# Patient Record
Sex: Female | Born: 1990 | Race: White | Hispanic: No | Marital: Single | Smoking: Former smoker
Health system: Southern US, Community
[De-identification: ages and names within clinical notes are randomized; demographics above are authoritative.]

## PROBLEM LIST (undated history)

## (undated) ENCOUNTER — Inpatient Hospital Stay (HOSPITAL_COMMUNITY): Payer: Self-pay

## (undated) DIAGNOSIS — F319 Bipolar disorder, unspecified: Secondary | ICD-10-CM

## (undated) DIAGNOSIS — F32A Depression, unspecified: Secondary | ICD-10-CM

## (undated) DIAGNOSIS — T8859XA Other complications of anesthesia, initial encounter: Secondary | ICD-10-CM

## (undated) DIAGNOSIS — B999 Unspecified infectious disease: Secondary | ICD-10-CM

## (undated) DIAGNOSIS — F329 Major depressive disorder, single episode, unspecified: Secondary | ICD-10-CM

## (undated) DIAGNOSIS — T7422XA Child sexual abuse, confirmed, initial encounter: Secondary | ICD-10-CM

## (undated) DIAGNOSIS — F419 Anxiety disorder, unspecified: Secondary | ICD-10-CM

## (undated) DIAGNOSIS — A599 Trichomoniasis, unspecified: Secondary | ICD-10-CM

## (undated) DIAGNOSIS — R87629 Unspecified abnormal cytological findings in specimens from vagina: Secondary | ICD-10-CM

## (undated) DIAGNOSIS — J45909 Unspecified asthma, uncomplicated: Secondary | ICD-10-CM

## (undated) DIAGNOSIS — T4145XA Adverse effect of unspecified anesthetic, initial encounter: Secondary | ICD-10-CM

## (undated) DIAGNOSIS — R079 Chest pain, unspecified: Secondary | ICD-10-CM

## (undated) HISTORY — PX: FRACTURE SURGERY: SHX138

## (undated) HISTORY — DX: Chest pain, unspecified: R07.9

## (undated) HISTORY — DX: Bipolar disorder, unspecified: F31.9

## (undated) HISTORY — DX: Depression, unspecified: F32.A

## (undated) HISTORY — DX: Adverse effect of unspecified anesthetic, initial encounter: T41.45XA

## (undated) HISTORY — DX: Anxiety disorder, unspecified: F41.9

## (undated) HISTORY — DX: Major depressive disorder, single episode, unspecified: F32.9

## (undated) HISTORY — PX: DILATION AND CURETTAGE OF UTERUS: SHX78

## (undated) HISTORY — PX: MOUTH SURGERY: SHX715

## (undated) HISTORY — DX: Unspecified asthma, uncomplicated: J45.909

## (undated) HISTORY — PX: DILATION AND CURETTAGE, DIAGNOSTIC / THERAPEUTIC: SUR384

## (undated) HISTORY — DX: Other complications of anesthesia, initial encounter: T88.59XA

## (undated) HISTORY — DX: Child sexual abuse, confirmed, initial encounter: T74.22XA

---
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
## 2015-08-16 ENCOUNTER — Encounter (HOSPITAL_COMMUNITY): Payer: Self-pay | Admitting: *Deleted

## 2015-08-16 ENCOUNTER — Emergency Department (HOSPITAL_COMMUNITY)
Admission: EM | Admit: 2015-08-16 | Discharge: 2015-08-16 | Disposition: A | Discharge status: Home / Self Care | Payer: Medicaid - Out of State | Attending: Emergency Medicine | Admitting: Emergency Medicine

## 2015-08-16 DIAGNOSIS — N939 Abnormal uterine and vaginal bleeding, unspecified: Secondary | ICD-10-CM | POA: Diagnosis present

## 2015-08-16 DIAGNOSIS — Z3202 Encounter for pregnancy test, result negative: Secondary | ICD-10-CM | POA: Diagnosis not present

## 2015-08-16 LAB — CBC WITH DIFFERENTIAL/PLATELET
Basophils Absolute: 0 10*3/uL (ref 0.0–0.1)
Basophils Relative: 0 %
Eosinophils Absolute: 0.1 10*3/uL (ref 0.0–0.7)
Eosinophils Relative: 1 %
HEMATOCRIT: 37.9 % (ref 36.0–46.0)
HEMOGLOBIN: 12.2 g/dL (ref 12.0–15.0)
LYMPHS ABS: 3.2 10*3/uL (ref 0.7–4.0)
LYMPHS PCT: 38 %
MCH: 29.3 pg (ref 26.0–34.0)
MCHC: 32.2 g/dL (ref 30.0–36.0)
MCV: 90.9 fL (ref 78.0–100.0)
MONOS PCT: 7 %
Monocytes Absolute: 0.6 10*3/uL (ref 0.1–1.0)
NEUTROS ABS: 4.5 10*3/uL (ref 1.7–7.7)
NEUTROS PCT: 54 %
Platelets: 157 10*3/uL (ref 150–400)
RBC: 4.17 MIL/uL (ref 3.87–5.11)
RDW: 13.1 % (ref 11.5–15.5)
WBC: 8.4 10*3/uL (ref 4.0–10.5)

## 2015-08-16 LAB — URINALYSIS, ROUTINE W REFLEX MICROSCOPIC
Bilirubin Urine: NEGATIVE
Glucose, UA: NEGATIVE mg/dL
Hgb urine dipstick: NEGATIVE
Ketones, ur: NEGATIVE mg/dL
NITRITE: NEGATIVE
PH: 5.5 (ref 5.0–8.0)
Protein, ur: NEGATIVE mg/dL
SPECIFIC GRAVITY, URINE: 1.027 (ref 1.005–1.030)

## 2015-08-16 LAB — URINE MICROSCOPIC-ADD ON

## 2015-08-16 LAB — POC URINE PREG, ED: Preg Test, Ur: NEGATIVE

## 2015-08-16 LAB — HCG, QUANTITATIVE, PREGNANCY: hCG, Beta Chain, Quant, S: 5 m[IU]/mL — ABNORMAL HIGH (ref <5)

## 2015-08-16 NOTE — ED Provider Notes (Signed)
History  By signing my name below, I, Earmon PhoenixJennifer Waddell, attest that this documentation has been prepared under the direction and in the presence of [facility] Neese, [place]. Electronically Signed: Earmon PhoenixJennifer Waddell, ED Scribe. 08/16/2015. 10:02 PM  Chief Complaint  Patient presents with  . Vaginal Bleeding   The history is provided by the patient and medical records. No language interpreter was used.    HPI Comments:  Connie Cabrera is a 25 y.o. female who presents to the Emergency Department complaining of vaginal bleeding that began earlier today. She reports sharp, lower abdominal pain and soreness. She states she has been trying to get pregnant and has been using ovulation kits. She has not seen an OB/GYN in the past. She denies modifying factors of her symptoms. She denies nausea, vomiting, fever or chills. She states her LMP was 07/10/15. She reports two miscarriages in the past and one ectopic pregnancy.  History reviewed. No pertinent past medical history. History reviewed. No pertinent past surgical history. No family history on file. Social History  Substance Use Topics  . Smoking status: Never Smoker   . Smokeless tobacco: None  . Alcohol Use: No   OB History    No data available     Review of Systems  Constitutional: Negative for fever, chills, diaphoresis and fatigue.  HENT: Negative for congestion, dental problem, ear pain, facial swelling, sinus pressure and sore throat.   Eyes: Negative for photophobia, pain and discharge.  Respiratory: Negative for cough, chest tightness and wheezing.   Gastrointestinal: Negative for nausea, vomiting, abdominal pain, diarrhea, constipation and abdominal distention.  Genitourinary: Positive for vaginal bleeding. Negative for dysuria, frequency, flank pain and difficulty urinating.  Musculoskeletal: Negative for myalgias, back pain, gait problem, neck pain and neck stiffness.  Skin: Negative for color change and rash.  Neurological:  Negative for dizziness, speech difficulty, weakness, light-headedness, numbness and headaches.  Psychiatric/Behavioral: Negative for confusion and agitation.    Allergies  Review of patient's allergies indicates no known allergies.  Home Medications   Prior to Admission medications   Not on File   Triage Vitals: BP 106/66 mmHg  Pulse 71  Temp(Src) 98.2 F (36.8 C)  Resp 16  Ht 5\' 7"  (1.702 m)  Wt 156 lb 2 oz (70.818 kg)  BMI 24.45 kg/m2  SpO2 100% Physical Exam  Constitutional: She is oriented to person, place, and time. She appears well-developed and well-nourished.  HENT:  Head: Normocephalic and atraumatic.  Eyes: EOM are normal.  Neck: Normal range of motion.  Cardiovascular: Normal rate.   Pulmonary/Chest: Effort normal.  Musculoskeletal: Normal range of motion.  Neurological: She is alert and oriented to person, place, and time.  Skin: Skin is warm and dry.  Psychiatric: She has a normal mood and affect. Her behavior is normal.  Nursing note and vitals reviewed.   ED Course  Procedures (including critical care time) DIAGNOSTIC STUDIES: Oxygen Saturation is 100% on RA, normal by my interpretation.   COORDINATION OF CARE: 9:02 PM- Informed pt of negative pregnancy test. Will perform pelvic exam and order I-Stat HCG. Pt verbalizes understanding and agrees to plan.  9:42 PM- Pt states she does not want a pelvic exam done, but only the results of the pregnancy blood test.    Medications - No data to display  Labs Review Labs Reviewed  URINALYSIS, ROUTINE W REFLEX MICROSCOPIC (NOT AT [facility]) - Abnormal; Notable for the following:    APPearance CLOUDY (*)    Leukocytes, UA SMALL (*)  All other components within normal limits  HCG, QUANTITATIVE, PREGNANCY - Abnormal; Notable for the following:    hCG, Beta Chain, Quant, S 5 (*)    All other components within normal limits  URINE MICROSCOPIC-ADD ON - Abnormal; Notable for the following:    Squamous Epithelial /  LPF 6-30 (*)    Bacteria, UA FEW (*)    All other components within normal limits  CBC WITH DIFFERENTIAL/PLATELET  POC URINE PREG, ED  GC/CHLAMYDIA PROBE AMP ([place]) NOT AT [facility]    Imaging Review No results found. I have personally reviewed and evaluated the lab results as part of my medical decision-making.   MDM  25 y.o. female with vaginal bleeding stable for d/c without negative urine pregnancy and Bhcg 5. Patient is upset and declined pelvic exam and would not wait for RN to go over d/c instructions.  Final diagnoses:  Vaginal bleeding   I personally performed the services described in this documentation, which was scribed in my presence. The recorded information has been reviewed and is accurate.    [address] [place], [place] 08/16/15 2255  Eber Hong, MD 08/17/15 [phone]

## 2015-08-16 NOTE — ED Notes (Signed)
The pt is 8 days late with her period and she thinks she is having a miscarriage  She has had 3 in the past.  lmp  April 18th

## 2015-08-30 ENCOUNTER — Emergency Department (HOSPITAL_COMMUNITY)
Admission: EM | Admit: 2015-08-30 | Discharge: 2015-08-30 | Disposition: A | Discharge status: Home / Self Care | Payer: Medicaid - Out of State | Attending: Emergency Medicine | Admitting: Emergency Medicine

## 2015-08-30 ENCOUNTER — Encounter (HOSPITAL_COMMUNITY): Payer: Self-pay | Admitting: *Deleted

## 2015-08-30 DIAGNOSIS — K029 Dental caries, unspecified: Secondary | ICD-10-CM | POA: Diagnosis not present

## 2015-08-30 DIAGNOSIS — K047 Periapical abscess without sinus: Secondary | ICD-10-CM

## 2015-08-30 DIAGNOSIS — K0381 Cracked tooth: Secondary | ICD-10-CM | POA: Diagnosis not present

## 2015-08-30 DIAGNOSIS — K002 Abnormalities of size and form of teeth: Secondary | ICD-10-CM | POA: Diagnosis not present

## 2015-08-30 DIAGNOSIS — K0889 Other specified disorders of teeth and supporting structures: Secondary | ICD-10-CM | POA: Diagnosis present

## 2015-08-30 MED ORDER — IBUPROFEN 600 MG PO TABS: 600.0000 mg | ORAL_TABLET | Freq: Four times a day (QID) | ORAL | Status: DC | PRN

## 2015-08-30 MED ORDER — PENICILLIN V POTASSIUM 500 MG PO TABS: 500.0000 mg | ORAL_TABLET | Freq: Three times a day (TID) | ORAL | Status: DC

## 2015-08-30 MED ORDER — TRAMADOL HCL 50 MG PO TABS: 50.0000 mg | ORAL_TABLET | Freq: Four times a day (QID) | ORAL | Status: DC | PRN

## 2015-08-30 NOTE — ED Provider Notes (Signed)
CSN: [id number]     Arrival date & time 08/30/15  1154 History  By signing my name below, I, Hollace HaywardAndrew Hiatt, attest that this documentation has been prepared under the direction and in the presence of Renne CriglerJoshua Niaja Stickley, PA-C.   Electronically Signed: Hollace HaywardAndrew Hiatt, ED Scribe. 08/30/2015. 12:54 PM.   CC: tooth pain  The history is provided by the patient. No language interpreter was used.   HPI Comments: Connie Cabrera is a 25 y.o. female who presents to the Emergency Department complaining of gradual onset, gradually worsening, constant upper dental pain that began last night. Pt reports that she was supposed to receive a root canal to the tooth with pain a few months PTA, but was unable to have her insurance cover it so she opted to not have it. Pt has taken 8 200mg  tablets of Ibuprofen and used an entire bottle of Oragel with no relief of her pain. No other alleviating factors, OTC medications, or home remedies tried PTA.  No past medical history on file. No past surgical history on file. No family history on file. Social History  Substance Use Topics  . Smoking status: Never Smoker   . Smokeless tobacco: Not on file  . Alcohol Use: No   OB History    No data available     Review of Systems  Constitutional: Negative for fever.  HENT: Positive for dental problem and facial swelling. Negative for ear pain, sore throat and trouble swallowing.   Respiratory: Negative for shortness of breath and stridor.   Musculoskeletal: Negative for neck pain.  Skin: Negative for color change.  Neurological: Negative for headaches.   Allergies  Review of patient's allergies indicates no known allergies.  Home Medications   Prior to Admission medications   Not on File   BP 104/67 mmHg  Pulse 62  Temp(Src) 97.5 F (36.4 C) (Oral)  Resp 18  Ht 5\' 7"  (1.702 m)  Wt 150 lb (68.04 kg)  BMI 23.49 kg/m2  SpO2 100%  LMP 07/13/2015   Physical Exam  Constitutional: She appears well-developed and  well-nourished.  HENT:  Head: Normocephalic and atraumatic.  Right Ear: Tympanic membrane, external ear and ear canal normal.  Left Ear: Tympanic membrane, external ear and ear canal normal.  Nose: Nose normal.  Mouth/Throat: Uvula is midline, oropharynx is clear and moist and mucous membranes are normal. No trismus in the jaw. Abnormal dentition. Dental caries present. No dental abscesses or uvula swelling. No tonsillar abscesses.  Patient with broken tooth #4 with mild swelling at the base of the tooth. No gross abscess.  Eyes: Conjunctivae are normal.  Neck: Normal range of motion. Neck supple.  No neck swelling or Ludwig's angina  Cardiovascular: Normal rate.   Pulmonary/Chest: Effort normal. No respiratory distress.  Abdominal: She exhibits no distension.  Musculoskeletal: Normal range of motion.  Lymphadenopathy:    She has no cervical adenopathy.  Neurological: She is alert.  Skin: Skin is warm and dry.  Psychiatric: She has a normal mood and affect. Her behavior is normal.  Nursing note and vitals reviewed.   ED Course  Procedures (including critical care time)  DIAGNOSTIC STUDIES: Oxygen Saturation is 100% on RA, normal by my interpretation.   COORDINATION OF CARE: 12:50 PM-Discussed next steps with pt including antibiotics. Pt verbalized understanding and is agreeable with the plan.   Vital signs reviewed and are as follows: BP 104/67 mmHg  Pulse 62  Temp(Src) 97.5 F (36.4 C) (Oral)  Resp 18  Ht   (1.702 m)  Wt 68.04 kg  BMI 23.49 kg/m2  SpO2 100%  LMP 07/13/2015  Patient counseled on use of narcotic pain medications. Counseled not to combine these medications with others containing tylenol. Urged not to drink alcohol, drive, or perform any other activities that requires focus while taking these medications. The patient verbalizes understanding and agrees with the plan.  Patient counseled to take prescribed medications as directed, return with worsening  facial or neck swelling, and to follow-up with their dentist as soon as possible.    MDM   Final diagnoses:  Dental infection   Patient with toothache. No fever. Exam unconcerning for Ludwig's angina or other deep tissue infection in neck.   As there is gum swelling, erythema, and facial swelling, will treat with antibiotic and pain medicine. Urged patient to follow-up with dentist.    I personally performed the services described in this documentation, which was scribed in my presence. The recorded information has been reviewed and is accurate.     Renne Crigler, PA-C 08/30/15 1305  Lyndal Pulley, MD 08/31/15 [phone]

## 2015-08-30 NOTE — ED Notes (Signed)
Pt reports dental pain started several days ago and worse last night.

## 2015-08-30 NOTE — Discharge Instructions (Signed)
Please read and follow all provided instructions.  Your diagnoses today include:  1. Dental infection    The exam and treatment you received today has been provided on an emergency basis only. This is not a substitute for complete medical or dental care.  Tests performed today include:  Vital signs. See below for your results today.   Medications prescribed:   Penicillin - antibiotic  You have been prescribed an antibiotic medicine: take the entire course of medicine even if you are feeling better. Stopping early can cause the antibiotic not to work.   Tramadol - narcotic-like pain medication  DO NOT drive or perform any activities that require you to be awake and alert because this medicine can make you drowsy.    Ibuprofen (Motrin, Advil) - anti-inflammatory pain medication  Do not exceed  ibuprofen every 6 hours, take with food  You have been prescribed an anti-inflammatory medication or NSAID. Take with food. Take smallest effective dose for the shortest duration needed for your pain. Stop taking if you experience stomach pain or vomiting.   Take any prescribed medications only as directed.  Home care instructions:  Follow any educational materials contained in this packet.  Follow-up instructions: Please follow-up with your dentist for further evaluation of your symptoms.   Dental Assistance: See below for dental referrals  Return instructions:   Please return to the Emergency Department if you experience worsening symptoms.  Please return if you develop a fever, you develop more swelling in your face or neck, you have trouble breathing or swallowing food.  Please return if you have any other emergent concerns.  Additional Information:  Your vital signs today were: BP 104/67 mmHg   Pulse 62   Temp(Src) 97.5 F (36.4 C) (Oral)   Resp 18   Ht  (1.702 m)   Wt 68.04 kg   BMI 23.49 kg/m2   SpO2 100%   LMP 07/13/2015 If your blood pressure (BP) was  elevated above 135/85 this visit, please have this repeated by your doctor within one month. -------------- [employer] 211 is a great source of information about community services available.  Access by dialing 2-1-1 from anywhere in [place], or by website -  PooledIncome.pl.   Other Local Resources (Updated 03/2015)  Dental  Care   Services    Phone Number and Address  Cost  [facility] For children 24 - 38 years of age:   Cleaning  Tooth brushing/flossing instruction  Sealants, fillings, crowns  Extractions  Emergency treatment  [phone] [address] [place], [place] [address] Charges based on family income.  Medicaid and some insurance plans accepted.     [facility] - [facility], fillings, crowns  Extractions  Emergency treatment [phone] [address] [place], [place]  Pregnant women 30 years of age or older with a Medicaid card  [facility] - [place]  Cleaning  Sealants, fillings, crowns  Extractions  Emergency treatment [phone] [address] [place], [place] Pregnant women 53 years of age or older with a Medicaid card  [facility] - [facility] For children 47 - 52 years of age:   Cleaning  Tooth brushing/flossing instruction  Sealants, fillings, crowns  Extractions  Emergency treatment Limited orthodontic services for patients with Medicaid [phone] [address] [place], [place] [address] Medicaid and [facility] Health Choice cover  for children up to age 27 and pregnant women.  Parents of children up to age 27 without Medicaid pay a reduced fee at time of service.  [facility] [place] For children 340 - 27 years of age:   Cleaning  Tooth brushing/flossing instruction  Sealants, fillings,  crowns  Extractions  Emergency treatment Limited orthodontic services for patients with Medicaid [phone] [address] [place], [place].  Medicaid and [place] Health Choice cover for children up to age 27 and pregnant women.  Parents of children up to age 27 without Medicaid pay a reduced fee.  [facility]  Cleaning  Sealants, fillings, crowns  Extractions  Hours: Tuesdays and Thursdays, 4:15 - 8 pm [phone] [address] [place], [place] [phone] Services free of charge to [place] [facility] [place] residents ages 18-64 who do not have health insurance, Medicare, [place], or [place] benefits and fall within federal poverty guidelines  [facility]    Provides dental care in addition to primary medical care, nutritional counseling, and pharmacy:  Nurse, mental healthCleaning  Sealants, fillings, crowns  Extractions                  [phone] [facility], [address] [place], [place]  [phone] [facility], [address] [place], [place]  [phone] [facility] [place], [place]  [phone] [facility], [address] [place], [place]  [phone] [facility] [address] [place], [place] Accepts [place], [place], most insurance.  Also provides services available to all with fees adjusted based on ability to pay.    [facility]  Cleaning  Tooth brushing/flossing instruction  Sealants, fillings, crowns  Extractions  Emergency treatment Hours: Tuesdays, Thursdays, and Fridays from 8 am to 5 pm by appointment only. [phone] [address] [place], [place] [phone] [facility] [place] residents with Medicaid (depending on eligibility) and children with [facility] Health Choice - call for more information.  [facility]  Extractions only  Hours: 2nd and  4th Thursday of each month from 6:30 am - 9 am.   [phone] [address] [place], [place] [phone] Ages 8018 and older only.  Patients are seen on a first come, first served basis.  [employer]  [employer]  Fillings  Extractions  Orthodontics  Endodontics  Implants/Crowns/Bridges  Complete and partial dentures [phone] [place], [place] Patients must complete an application for services.  There is often a waiting list.

## 2015-08-30 NOTE — ED Notes (Signed)
Declined W/C at D/C and was escorted to lobby by RN. 

## 2015-10-06 ENCOUNTER — Emergency Department (HOSPITAL_COMMUNITY): Payer: Medicaid Other

## 2015-10-06 ENCOUNTER — Encounter (HOSPITAL_COMMUNITY): Payer: Self-pay

## 2015-10-06 ENCOUNTER — Emergency Department (HOSPITAL_COMMUNITY)
Admission: EM | Admit: 2015-10-06 | Discharge: 2015-10-06 | Disposition: A | Discharge status: Home / Self Care | Payer: Medicaid Other | Attending: Emergency Medicine | Admitting: Emergency Medicine

## 2015-10-06 DIAGNOSIS — Z3A01 Less than 8 weeks gestation of pregnancy: Secondary | ICD-10-CM | POA: Diagnosis not present

## 2015-10-06 DIAGNOSIS — O209 Hemorrhage in early pregnancy, unspecified: Secondary | ICD-10-CM | POA: Diagnosis present

## 2015-10-06 DIAGNOSIS — O200 Threatened abortion: Secondary | ICD-10-CM | POA: Diagnosis not present

## 2015-10-06 DIAGNOSIS — N939 Abnormal uterine and vaginal bleeding, unspecified: Secondary | ICD-10-CM

## 2015-10-06 DIAGNOSIS — Z3490 Encounter for supervision of normal pregnancy, unspecified, unspecified trimester: Secondary | ICD-10-CM

## 2015-10-06 LAB — URINE MICROSCOPIC-ADD ON: RBC / HPF: NONE SEEN RBC/hpf (ref 0–5)

## 2015-10-06 LAB — ABO/RH: ABO/RH(D): A POS

## 2015-10-06 LAB — TYPE AND SCREEN
ABO/RH(D): A POS
Antibody Screen: NEGATIVE

## 2015-10-06 LAB — URINALYSIS, ROUTINE W REFLEX MICROSCOPIC
Bilirubin Urine: NEGATIVE
Glucose, UA: NEGATIVE mg/dL
HGB URINE DIPSTICK: NEGATIVE
KETONES UR: NEGATIVE mg/dL
Nitrite: NEGATIVE
PROTEIN: NEGATIVE mg/dL
SPECIFIC GRAVITY, URINE: 1.009 (ref 1.005–1.030)
pH: 7 (ref 5.0–8.0)

## 2015-10-06 LAB — WET PREP, GENITAL
Sperm: NONE SEEN
YEAST WET PREP: NONE SEEN

## 2015-10-06 LAB — I-STAT BETA HCG BLOOD, ED (MC, WL, AP ONLY)

## 2015-10-06 LAB — HCG, QUANTITATIVE, PREGNANCY: hCG, Beta Chain, Quant, S: 49009 m[IU]/mL — ABNORMAL HIGH (ref <5)

## 2015-10-06 NOTE — ED Notes (Signed)
Patient here with faint vaginal bleeding/spotting that started on Monday. States she had 2 home positive pregnancy test this past week at home so here to get verification. Has had 2 miscarriages in the past

## 2015-10-06 NOTE — Discharge Instructions (Signed)
Return without fail for worsening symptoms, including worsening vaginal bleeding, passing out, severe abdominal pain or any other symptoms concerning to you.  You are given follow-up for OB care above, you will need to call to make appointment.  First Trimester of Pregnancy The first trimester of pregnancy is from week 1 until the end of week 12 (months 1 through 3). During this time, your baby will begin to develop inside you. At 6-8 weeks, the eyes and face are formed, and the heartbeat can be seen on ultrasound. At the end of 12 weeks, all the baby's organs are formed. Prenatal care is all the medical care you receive before the birth of your baby. Make sure you get good prenatal care and follow all of your doctor's instructions. HOME CARE  Medicines  Take medicine only as told by your doctor. Some medicines are safe and some are not during pregnancy.  Take your prenatal vitamins as told by your doctor.  Take medicine that helps you poop (stool softener) as needed if your doctor says it is okay. Diet  Eat regular, healthy meals.  Your doctor will tell you the amount of weight gain that is right for you.  Avoid raw meat and uncooked cheese.  If you feel sick to your stomach (nauseous) or throw up (vomit):  Eat 4 or 5 small meals a day instead of 3 large meals.  Try eating a few soda crackers.  Drink liquids between meals instead of during meals.  If you have a hard time pooping (constipation):  Eat high-fiber foods like fresh vegetables, fruit, and whole grains.  Drink enough fluids to keep your pee (urine) clear or pale yellow. Activity and Exercise  Exercise only as told by your doctor. Stop exercising if you have cramps or pain in your lower belly (abdomen) or low back.  Try to avoid standing for long periods of time. Move your legs often if you must stand in one place for a long time.  Avoid heavy lifting.  Wear low-heeled shoes. Sit and stand up straight.  You can  have sex unless your doctor tells you not to. Relief of Pain or Discomfort  Wear a good support bra if your breasts are sore.  Take warm water baths (sitz baths) to soothe pain or discomfort caused by hemorrhoids. Use hemorrhoid cream if your doctor says it is okay.  Rest with your legs raised if you have leg cramps or low back pain.  Wear support hose if you have puffy, bulging veins (varicose veins) in your legs. Raise (elevate) your feet for 15 minutes, 3-4 times a day. Limit salt in your diet. Prenatal Care  Schedule your prenatal visits by the twelfth week of pregnancy.  Write down your questions. Take them to your prenatal visits.  Keep all your prenatal visits as told by your doctor. Safety  Wear your seat belt at all times when driving.  Make a list of emergency phone numbers. The list should include numbers for family, friends, the hospital, and police and fire departments. General Tips  Ask your doctor for a referral to a local prenatal class. Begin classes no later than at the start of month 6 of your pregnancy.  Ask for help if you need counseling or help with nutrition. Your doctor can give you advice or tell you where to go for help.  Do not use hot tubs, steam rooms, or saunas.  Do not douche or use tampons or scented sanitary pads.  Do not  cross your legs for long periods of time.  Avoid litter boxes and soil used by cats.  Avoid all smoking, herbs, and alcohol. Avoid drugs not approved by your doctor.  Do not use any tobacco products, including cigarettes, chewing tobacco, and electronic cigarettes. If you need help quitting, ask your doctor. You may get counseling or other support to help you quit.  Visit your dentist. At home, brush your teeth with a soft toothbrush. Be gentle when you floss. GET HELP IF:  You are dizzy.  You have mild cramps or pressure in your lower belly.  You have a nagging pain in your belly area.  You continue to feel sick to  your stomach, throw up, or have watery poop (diarrhea).  You have a bad smelling fluid coming from your vagina.  You have pain with peeing (urination).  You have increased puffiness (swelling) in your face, hands, legs, or ankles. GET HELP RIGHT AWAY IF:   You have a fever.  You are leaking fluid from your vagina.  You have spotting or bleeding from your vagina.  You have very bad belly cramping or pain.  You gain or lose weight rapidly.  You throw up blood. It may look like coffee grounds.  You are around people who have [place] measles, fifth disease, or chickenpox.  You have a very bad headache.  You have shortness of breath.  You have any kind of trauma, such as from a fall or a car accident.   This information is not intended to replace advice given to you by your health care provider. Make sure you discuss any questions you have with your health care provider.   Document Released: 08/30/2007 Document Revised: 04/03/2014 Document Reviewed: 01/21/2013 Elsevier Interactive Patient Education 2016 Elsevier Inc.  Vaginal Bleeding During Pregnancy, First Trimester A small amount of bleeding (spotting) from the vagina is common in early pregnancy. Sometimes the bleeding is normal and is not a problem, and sometimes it is a sign of something serious. Be sure to tell your doctor about any bleeding from your vagina right away. HOME CARE  Watch your condition for any changes.  Follow your doctor's instructions about how active you can be.  If you are on bed rest:  You may need to stay in bed and only get up to use the bathroom.  You may be allowed to do some activities.  If you need help, make plans for someone to help you.  Write down:  The number of pads you use each day.  How often you change pads.  How soaked (saturated) your pads are.  Do not use tampons.  Do not douche.  Do not have sex or orgasms until your doctor says it is okay.  If you pass any  tissue from your vagina, save the tissue so you can show it to your doctor.  Only take medicines as told by your doctor.  Do not take aspirin because it can make you bleed.  Keep all follow-up visits as told by your doctor. GET HELP IF:   You bleed from your vagina.  You have cramps.  You have labor pains.  You have a fever that does not go away after you take medicine. GET HELP RIGHT AWAY IF:   You have very bad cramps in your back or belly (abdomen).  You pass large clots or tissue from your vagina.  You bleed more.  You feel light-headed or weak.  You pass out (faint).  You have  chills.  You are leaking fluid or have a gush of fluid from your vagina.  You pass out while pooping (having a bowel movement). MAKE SURE YOU:  Understand these instructions.  Will watch your condition.  Will get help right away if you are not doing well or get worse.   This information is not intended to replace advice given to you by your health care provider. Make sure you discuss any questions you have with your health care provider.   Document Released: 07/28/2013 Document Reviewed: 07/28/2013 Elsevier Interactive Patient Education [employer]

## 2015-10-06 NOTE — ED Provider Notes (Signed)
CSN: [id number]     Arrival date & time 10/06/15  1502 History   First MD Initiated Contact with Patient 10/06/15 1734     Chief Complaint  Patient presents with  . Vaginal Bleeding     (Consider location/radiation/quality/duration/timing/severity/associated sxs/prior Treatment) HPI 25 year old female who presents with vaginal bleeding. Currently G4P0 w/ history of three prior miscarriages. Her last menstrual period was May 22. Has taken multiple home pregnancy test is positive. Started having nausea and vomiting over the past few days, and noticed that she has blood-tinged vaginal discharge. No significant abdominal pain, fever, dysuria, urinary frequency.  History reviewed. No pertinent past medical history. History reviewed. No pertinent past surgical history. History reviewed. No pertinent family history. Social History  Substance Use Topics  . Smoking status: Never Smoker   . Smokeless tobacco: None  . Alcohol Use: No   OB History    No data available     Review of Systems 10/14 systems reviewed and are negative other than those stated in the HPI    Allergies  Review of patient's allergies indicates no known allergies.  Home Medications   Prior to Admission medications   Medication Sig Start Date End Date Taking? Authorizing Provider  ibuprofen (ADVIL,MOTRIN) 600 MG tablet Take 1 tablet (600 mg total) by mouth every 6 (six) hours as needed. 08/30/15   Renne CriglerJoshua Geiple, PA-C  penicillin v potassium (VEETID) 500 MG tablet Take 1 tablet (500 mg total) by mouth 3 (three) times daily. 08/30/15   Renne CriglerJoshua Geiple, PA-C  traMADol (ULTRAM) 50 MG tablet Take 1 tablet (50 mg total) by mouth every 6 (six) hours as needed. 08/30/15   Renne CriglerJoshua Geiple, PA-C   BP 98/65 mmHg  Pulse 70  Temp(Src) 98.3 F (36.8 C) (Oral)  Resp 18  Ht 5\' 7"  (1.702 m)  Wt 160 lb (72.576 kg)  BMI 25.05 kg/m2  SpO2 98%  LMP 08/16/2015 Physical Exam Physical Exam  Nursing note and vitals  reviewed. Constitutional: Well developed, well nourished, non-toxic, and in no acute distress Head: Normocephalic and atraumatic.  Mouth/Throat: Oropharynx is clear and moist.  Neck: Normal range of motion. Neck supple.  Cardiovascular: Normal rate and regular rhythm.   Pulmonary/Chest: Effort normal and breath sounds normal.  Abdominal: Soft. There is no tenderness. There is no rebound and no guarding.  Pelvic: Normal external genitalia. Normal internal genitalia. No discharge. No blood within the vagina. No cervical motion tenderness. No adnexal masses or tenderness. Musculoskeletal: Normal range of motion.  Neurological: Alert, no facial droop, fluent speech, moves all extremities symmetrically Skin: Skin is warm and dry.  Psychiatric: Cooperative  ED Course  Procedures (including critical care time) Labs Review Labs Reviewed  WET PREP, GENITAL - Abnormal; Notable for the following:    Trich, Wet Prep PRESENT (*)    Clue Cells Wet Prep HPF POC PRESENT (*)    WBC, Wet Prep HPF POC MANY (*)    All other components within normal limits  HCG, QUANTITATIVE, PREGNANCY - Abnormal; Notable for the following:    hCG, Beta Chain, Quant, S 49009 (*)    All other components within normal limits  URINALYSIS, ROUTINE W REFLEX MICROSCOPIC (NOT AT [facility]) - Abnormal; Notable for the following:    Leukocytes, UA SMALL (*)    All other components within normal limits  URINE MICROSCOPIC-ADD ON - Abnormal; Notable for the following:    Squamous Epithelial / LPF 6-30 (*)    Bacteria, UA FEW (*)  All other components within normal limits  I-STAT BETA HCG BLOOD, ED ([facility], [facility], [facility] ONLY) - Abnormal; Notable for the following:    I-stat hCG, quantitative >2000.0 (*)    All other components within normal limits  TYPE AND SCREEN  ABO/RH  GC/CHLAMYDIA PROBE AMP ([place]) NOT AT [facility]    Imaging Review US Ob Comp Less 14 Wks  10/06/2015  CLINICAL DATA:  Mild vaginal bleeding with positive pregnancy  test. EXAM: OBSTETRIC <14 WK [place] AND TRANSVAGINAL OB US TECHNIQUE: Both transabdominal and transvaginal ultrasound examinations were performed for complete evaluation of the gestation as well as the maternal uterus, adnexal regions, and pelvic cul-de-sac. Transvaginal technique was performed to assess early pregnancy. COMPARISON:  None. FINDINGS: Intrauterine gestational sac: Visualized. Yolk sac:  Visualized. Embryo:  Visualized. Cardiac Activity: Visualized. Heart Rate: 112  bpm CRL:  4.3  mm   6 w   1 d                  [place] EDC: 05/30/2016 Subchorionic hemorrhage:  None visualized. Maternal uterus/adnexae: Potential corpus luteum cyst right ovary. Tiny amount of free fluid is identified, mainly in the cul-de-sac and right adnexal region. IMPRESSION: Single living intrauterine gestation at estimated 6 week 1 day gestational age by crown-rump length. Electronically Signed   By: Kennith Center M.D.   On: 10/06/2015 19:24   US Ob Transvaginal  10/06/2015  CLINICAL DATA:  Mild vaginal bleeding with positive pregnancy test. EXAM: OBSTETRIC <14 WK [place] AND TRANSVAGINAL OB US TECHNIQUE: Both transabdominal and transvaginal ultrasound examinations were performed for complete evaluation of the gestation as well as the maternal uterus, adnexal regions, and pelvic cul-de-sac. Transvaginal technique was performed to assess early pregnancy. COMPARISON:  None. FINDINGS: Intrauterine gestational sac: Visualized. Yolk sac:  Visualized. Embryo:  Visualized. Cardiac Activity: Visualized. Heart Rate: 112  bpm CRL:  4.3  mm   6 w   1 d                  [place] EDC: 05/30/2016 Subchorionic hemorrhage:  None visualized. Maternal uterus/adnexae: Potential corpus luteum cyst right ovary. Tiny amount of free fluid is identified, mainly in the cul-de-sac and right adnexal region. IMPRESSION: Single living intrauterine gestation at estimated 6 week 1 day gestational age by crown-rump length. Electronically Signed   By: Kennith Center M.D.   On:  10/06/2015 19:24   I have personally reviewed and evaluated these images and lab results as part of my medical decision-making.   EKG Interpretation None      MDM   Final diagnoses:  Intrauterine pregnancy  Vaginal spotting  Threatened miscarriage    25 year old female G4P0 who presents with vaginal spotting. Well appearing, stable vital signs, benign abdomen. Pelvic exam unremarkable, cervix closed and no active vaginal bleeding. With IUP at 6 wks 1 day. Rh positive, unremarkable blood work. Given referral to [facility] clinic for follow-up. Strict return and follow-up instructions reviewed. She expressed understanding of all discharge instructions and felt comfortable with the plan of care.     Lavera Guise, MD 10/06/15 2051

## 2015-10-06 NOTE — ED Notes (Signed)
Pt still in US

## 2015-10-07 LAB — GC/CHLAMYDIA PROBE AMP (~~LOC~~) NOT AT ARMC
CHLAMYDIA, DNA PROBE: NEGATIVE
Neisseria Gonorrhea: NEGATIVE

## 2015-10-21 ENCOUNTER — Encounter (HOSPITAL_COMMUNITY): Payer: Self-pay | Admitting: *Deleted

## 2015-10-21 ENCOUNTER — Inpatient Hospital Stay (HOSPITAL_COMMUNITY)
Admission: AD | Admit: 2015-10-21 | Discharge: 2015-10-21 | Disposition: A | Discharge status: Home / Self Care | Payer: Medicaid Other | Source: Ambulatory Visit | Attending: Obstetrics and Gynecology | Admitting: Obstetrics and Gynecology

## 2015-10-21 DIAGNOSIS — W19XXXA Unspecified fall, initial encounter: Secondary | ICD-10-CM | POA: Insufficient documentation

## 2015-10-21 DIAGNOSIS — O219 Vomiting of pregnancy, unspecified: Secondary | ICD-10-CM | POA: Diagnosis not present

## 2015-10-21 DIAGNOSIS — O23591 Infection of other part of genital tract in pregnancy, first trimester: Secondary | ICD-10-CM | POA: Diagnosis not present

## 2015-10-21 DIAGNOSIS — O98311 Other infections with a predominantly sexual mode of transmission complicating pregnancy, first trimester: Secondary | ICD-10-CM | POA: Diagnosis present

## 2015-10-21 DIAGNOSIS — Z3A09 9 weeks gestation of pregnancy: Secondary | ICD-10-CM | POA: Diagnosis not present

## 2015-10-21 DIAGNOSIS — N898 Other specified noninflammatory disorders of vagina: Secondary | ICD-10-CM

## 2015-10-21 DIAGNOSIS — A5901 Trichomonal vulvovaginitis: Secondary | ICD-10-CM | POA: Insufficient documentation

## 2015-10-21 DIAGNOSIS — Y92009 Unspecified place in unspecified non-institutional (private) residence as the place of occurrence of the external cause: Secondary | ICD-10-CM | POA: Insufficient documentation

## 2015-10-21 DIAGNOSIS — O26891 Other specified pregnancy related conditions, first trimester: Secondary | ICD-10-CM

## 2015-10-21 LAB — URINE MICROSCOPIC-ADD ON

## 2015-10-21 LAB — WET PREP, GENITAL
CLUE CELLS WET PREP: NONE SEEN
SPERM: NONE SEEN
Yeast Wet Prep HPF POC: NONE SEEN

## 2015-10-21 LAB — URINALYSIS, ROUTINE W REFLEX MICROSCOPIC
Bilirubin Urine: NEGATIVE
GLUCOSE, UA: NEGATIVE mg/dL
HGB URINE DIPSTICK: NEGATIVE
Ketones, ur: NEGATIVE mg/dL
Nitrite: NEGATIVE
PH: 6 (ref 5.0–8.0)
Protein, ur: NEGATIVE mg/dL
SPECIFIC GRAVITY, URINE: 1.025 (ref 1.005–1.030)

## 2015-10-21 MED ORDER — METRONIDAZOLE 500 MG PO TABS
2000.0000 mg | ORAL_TABLET | Freq: Once | ORAL | Status: AC
  Administered 2015-10-21: 2000 mg via ORAL
  Filled 2015-10-21: qty 4

## 2015-10-21 NOTE — MAU Provider Note (Signed)
Chief Complaint: Fall   First Provider Initiated Contact with Patient 10/21/15 1450     SUBJECTIVE HPI: Connie Cabrera is a 25 y.o. G4P0030 at [redacted]w[redacted]d who presents to [facility] reporting fall 5 days ago, with some vaginal discharge. Denies any vaginal bleeding. Denies any abdominal pain or cramping. Reports N/V, with poor oral intake. Patient has history of 2 SABs, most recently 6 months ago.  After reviewing patient's chart, she was seen in the ED two weeks ago, was found to be positive for trichomonas and BV, patient states she was only treated with PV metronidazole, not told of +trich infection. +Trich on wet prep today. Discussed with patient will treat for it today, patient agrees with plan.   No past medical history on file. OB History  Gravida Para Term Preterm AB Living  4 0     3 0  SAB TAB Ectopic Multiple Live Births    1          # Outcome Date GA Lbr Len/2nd Weight Sex Delivery Anes PTL Lv  4 Current           3 AB           2 AB           1 TAB              No past surgical history on file. Social History   Social History  . Marital status: Single    Spouse name: N/A  . Number of children: N/A  . Years of education: N/A   Occupational History  . Not on file.   Social History Main Topics  . Smoking status: Never Smoker  . Smokeless tobacco: Not on file  . Alcohol use No  . Drug use: Unknown  . Sexual activity: Not on file   Other Topics Concern  . Not on file   Social History Narrative  . No narrative on file   No current facility-administered medications on file prior to encounter.    Current Outpatient Prescriptions on File Prior to Encounter  Medication Sig Dispense Refill  . ibuprofen (ADVIL,MOTRIN) 600 MG tablet Take 1 tablet (600 mg total) by mouth every 6 (six) hours as needed. 20 tablet 0  . penicillin v potassium (VEETID) 500 MG tablet Take 1 tablet (500 mg total) by mouth 3 (three) times daily. 21 tablet 0  . traMADol (ULTRAM)  50 MG tablet Take 1 tablet (50 mg total) by mouth every 6 (six) hours as needed. 8 tablet 0   No Known Allergies  I have reviewed the past Medical Hx, Surgical Hx, Social Hx, Allergies and Medications.   Review of Systems  Review of Systems - Negative except nausea. Reports bruising on left shoulder from fall.   OBJECTIVE Patient Vitals for the past 24 hrs:  BP Temp Temp src Pulse Resp Height Weight  10/21/15 1428 104/61 98.1 F (36.7 C) Oral 74 16 5\' 7"  (1.702 m) 156 lb (70.8 kg)   Constitutional: Well-developed, well-nourished female in no acute distress.  Cardiovascular: normal rate Respiratory: normal rate and effort.  GI: Abd soft, non-tender, gravid appropriate for gestational age. Pos BS x 4 MS: Extremities nontender, no edema, normal ROM Neurologic: Alert and oriented x 4.  GU: Neg CVAT.  SPECULUM EXAM: NEFG, physiologic discharge, no blood noted, cervix clean  BIMANUAL: cervix closed; uterus normal size, no adnexal tenderness or masses. No CMT.  LAB RESULTS No results found for this or any previous visit (from  the past 24 hour(s)).  IMAGING US Ob Comp Less 14 Wks  Result Date: 10/06/2015 CLINICAL DATA:  Mild vaginal bleeding with positive pregnancy test. EXAM: OBSTETRIC <14 WK [place] AND TRANSVAGINAL OB US TECHNIQUE: Both transabdominal and transvaginal ultrasound examinations were performed for complete evaluation of the gestation as well as the maternal uterus, adnexal regions, and pelvic cul-de-sac. Transvaginal technique was performed to assess early pregnancy. COMPARISON:  None. FINDINGS: Intrauterine gestational sac: Visualized. Yolk sac:  Visualized. Embryo:  Visualized. Cardiac Activity: Visualized. Heart Rate: 112  bpm CRL:  4.3  mm   6 w   1 d                  [place] EDC: 05/30/2016 Subchorionic hemorrhage:  None visualized. Maternal uterus/adnexae: Potential corpus luteum cyst right ovary. Tiny amount of free fluid is identified, mainly in the cul-de-sac and right adnexal  region. IMPRESSION: Single living intrauterine gestation at estimated 6 week 1 day gestational age by crown-rump length. Electronically Signed   By: Kennith Center M.D.   On: 10/06/2015 19:24   US Ob Transvaginal  Result Date: 10/06/2015 CLINICAL DATA:  Mild vaginal bleeding with positive pregnancy test. EXAM: OBSTETRIC <14 WK [place] AND TRANSVAGINAL OB US TECHNIQUE: Both transabdominal and transvaginal ultrasound examinations were performed for complete evaluation of the gestation as well as the maternal uterus, adnexal regions, and pelvic cul-de-sac. Transvaginal technique was performed to assess early pregnancy. COMPARISON:  None. FINDINGS: Intrauterine gestational sac: Visualized. Yolk sac:  Visualized. Embryo:  Visualized. Cardiac Activity: Visualized. Heart Rate: 112  bpm CRL:  4.3  mm   6 w   1 d                  [place] EDC: 05/30/2016 Subchorionic hemorrhage:  None visualized. Maternal uterus/adnexae: Potential corpus luteum cyst right ovary. Tiny amount of free fluid is identified, mainly in the cul-de-sac and right adnexal region. IMPRESSION: Single living intrauterine gestation at estimated 6 week 1 day gestational age by crown-rump length. Electronically Signed   By: Kennith Center M.D.   On: 10/06/2015 19:24    MAU COURSE Pelvic/speculum exam.  Education given.  ASSESSMENT 1. Trichomonal vaginitis during pregnancy in first trimester   2. Vaginal discharge during pregnancy in first trimester   3. Fall at home, initial encounter     PLAN Discharge home in stable condition. S/P treatment for trichomonas, not treated in ED 2 weeks ago (positive test result). To follow up with OB.      Medication List    ASK your doctor about these medications   ibuprofen 600 MG tablet Commonly known as:  ADVIL,MOTRIN Take 1 tablet (600 mg total) by mouth every 6 (six) hours as needed.   penicillin v potassium 500 MG tablet Commonly known as:  VEETID Take 1 tablet (500 mg total) by mouth 3 (three)  times daily.   traMADol 50 MG tablet Commonly known as:  ULTRAM Take 1 tablet (50 mg total) by mouth every 6 (six) hours as needed.        [address] [place], [place] 10/21/2015  3:07 PM

## 2015-10-21 NOTE — Discharge Instructions (Signed)
First Trimester of Pregnancy °The first trimester of pregnancy is from week 1 until the end of week 12 (months 1 through 3). A week after a sperm fertilizes an egg, the egg will implant on the wall of the uterus. This embryo will begin to develop into a baby. Genes from you and your partner are forming the baby. The female genes determine whether the baby is a boy or a girl. At 6-8 weeks, the eyes and face are formed, and the heartbeat can be seen on ultrasound. At the end of 12 weeks, all the baby's organs are formed.  °Now that you are pregnant, you will want to do everything you can to have a healthy baby. Two of the most important things are to get good prenatal care and to follow your health care provider's instructions. Prenatal care is all the medical care you receive before the baby's birth. This care will help prevent, find, and treat any problems during the pregnancy and childbirth. °BODY CHANGES °Your body goes through many changes during pregnancy. The changes vary from woman to woman.  °· You may gain or lose a couple of pounds at first. °· You may feel sick to your stomach (nauseous) and throw up (vomit). If the vomiting is uncontrollable, call your health care provider. °· You may tire easily. °· You may develop headaches that can be relieved by medicines approved by your health care provider. °· You may urinate more often. Painful urination may mean you have a bladder infection. °· You may develop heartburn as a result of your pregnancy. °· You may develop constipation because certain hormones are causing the muscles that push waste through your intestines to slow down. °· You may develop hemorrhoids or swollen, bulging veins (varicose veins). °· Your breasts may begin to grow larger and become tender. Your nipples may stick out more, and the tissue that surrounds them (areola) may become darker. °· Your gums may bleed and may be sensitive to brushing and flossing. °· Dark spots or blotches (chloasma,  mask of pregnancy) may develop on your face. This will likely fade after the baby is born. °· Your menstrual periods will stop. °· You may have a loss of appetite. °· You may develop cravings for certain kinds of food. °· You may have changes in your emotions from day to day, such as being excited to be pregnant or being concerned that something may go wrong with the pregnancy and baby. °· You may have more vivid and strange dreams. °· You may have changes in your hair. These can include thickening of your hair, rapid growth, and changes in texture. Some women also have hair loss during or after pregnancy, or hair that feels dry or thin. Your hair will most likely return to normal after your baby is born. °WHAT TO EXPECT AT YOUR PRENATAL VISITS °During a routine prenatal visit: °· You will be weighed to make sure you and the baby are growing normally. °· Your blood pressure will be taken. °· Your abdomen will be measured to track your baby's growth. °· The fetal heartbeat will be listened to starting around week 10 or 12 of your pregnancy. °· Test results from any previous visits will be discussed. °Your health care provider may ask you: °· How you are feeling. °· If you are feeling the baby move. °· If you have had any abnormal symptoms, such as leaking fluid, bleeding, severe headaches, or abdominal cramping. °· If you are using any tobacco products,   including cigarettes, chewing tobacco, and electronic cigarettes. °· If you have any questions. °Other tests that may be performed during your first trimester include: °· Blood tests to find your blood type and to check for the presence of any previous infections. They will also be used to check for low iron levels (anemia) and Rh antibodies. Later in the pregnancy, blood tests for diabetes will be done along with other tests if problems develop. °· Urine tests to check for infections, diabetes, or protein in the urine. °· An ultrasound to confirm the proper growth  and development of the baby. °· An amniocentesis to check for possible genetic problems. °· Fetal screens for spina bifida and Down syndrome. °· You may need other tests to make sure you and the baby are doing well. °· HIV (human immunodeficiency virus) testing. Routine prenatal testing includes screening for HIV, unless you choose not to have this test. °HOME CARE INSTRUCTIONS  °Medicines °· Follow your health care provider's instructions regarding medicine use. Specific medicines may be either safe or unsafe to take during pregnancy. °· Take your prenatal vitamins as directed. °· If you develop constipation, try taking a stool softener if your health care provider approves. °Diet °· Eat regular, well-balanced meals. Choose a variety of foods, such as meat or vegetable-based protein, fish, milk and low-fat dairy products, vegetables, fruits, and whole grain breads and cereals. Your health care provider will help you determine the amount of weight gain that is right for you. °· Avoid raw meat and uncooked cheese. These carry germs that can cause birth defects in the baby. °· Eating four or five small meals rather than three large meals a day may help relieve nausea and vomiting. If you start to feel nauseous, eating a few soda crackers can be helpful. Drinking liquids between meals instead of during meals also seems to help nausea and vomiting. °· If you develop constipation, eat more high-fiber foods, such as fresh vegetables or fruit and whole grains. Drink enough fluids to keep your urine clear or pale yellow. °Activity and Exercise °· Exercise only as directed by your health care provider. Exercising will help you: °· Control your weight. °· Stay in shape. °· Be prepared for labor and delivery. °· Experiencing pain or cramping in the lower abdomen or low back is a good sign that you should stop exercising. Check with your health care provider before continuing normal exercises. °· Try to avoid standing for long  periods of time. Move your legs often if you must stand in one place for a long time. °· Avoid heavy lifting. °· Wear low-heeled shoes, and practice good posture. °· You may continue to have sex unless your health care provider directs you otherwise. °Relief of Pain or Discomfort °· Wear a good support bra for breast tenderness.   °· Take warm sitz baths to soothe any pain or discomfort caused by hemorrhoids. Use hemorrhoid cream if your health care provider approves.   °· Rest with your legs elevated if you have leg cramps or low back pain. °· If you develop varicose veins in your legs, wear support hose. Elevate your feet for 15 minutes, 3-4 times a day. Limit salt in your diet. °Prenatal Care °· Schedule your prenatal visits by the twelfth week of pregnancy. They are usually scheduled monthly at first, then more often in the last 2 months before delivery. °· Write down your questions. Take them to your prenatal visits. °· Keep all your prenatal visits as directed by your   health care provider. °Safety °· Wear your seat belt at all times when driving. °· Make a list of emergency phone numbers, including numbers for family, friends, the hospital, and police and fire departments. °General Tips °· Ask your health care provider for a referral to a local prenatal education class. Begin classes no later than at the beginning of month 6 of your pregnancy. °· Ask for help if you have counseling or nutritional needs during pregnancy. Your health care provider can offer advice or refer you to specialists for help with various needs. °· Do not use hot tubs, steam rooms, or saunas. °· Do not douche or use tampons or scented sanitary pads. °· Do not cross your legs for long periods of time. °· Avoid cat litter boxes and soil used by cats. These carry germs that can cause birth defects in the baby and possibly loss of the fetus by miscarriage or stillbirth. °· Avoid all smoking, herbs, alcohol, and medicines not prescribed by  your health care provider. Chemicals in these affect the formation and growth of the baby. °· Do not use any tobacco products, including cigarettes, chewing tobacco, and electronic cigarettes. If you need help quitting, ask your health care provider. You may receive counseling support and other resources to help you quit. °· Schedule a dentist appointment. At home, brush your teeth with a soft toothbrush and be gentle when you floss. °SEEK MEDICAL CARE IF:  °· You have dizziness. °· You have mild pelvic cramps, pelvic pressure, or nagging pain in the abdominal area. °· You have persistent nausea, vomiting, or diarrhea. °· You have a bad smelling vaginal discharge. °· You have pain with urination. °· You notice increased swelling in your face, hands, legs, or ankles. °SEEK IMMEDIATE MEDICAL CARE IF:  °· You have a fever. °· You are leaking fluid from your vagina. °· You have spotting or bleeding from your vagina. °· You have severe abdominal cramping or pain. °· You have rapid weight gain or loss. °· You vomit blood or material that looks like coffee grounds. °· You are exposed to German measles and have never had them. °· You are exposed to fifth disease or chickenpox. °· You develop a severe headache. °· You have shortness of breath. °· You have any kind of trauma, such as from a fall or a car accident. °  °This information is not intended to replace advice given to you by your health care provider. Make sure you discuss any questions you have with your health care provider. °  °Document Released: 03/07/2001 Document Revised: 04/03/2014 Document Reviewed: 01/21/2013 °Elsevier Interactive Patient Education ©2016 [employer] °Trichomoniasis °Trichomoniasis is an infection caused by an organism called Trichomonas. The infection can affect both women and men. In women, the outer female genitalia and the vagina are affected. In men, the penis is mainly affected, but the prostate and other reproductive organs can also  be involved. Trichomoniasis is a sexually transmitted infection (STI) and is most often passed to another person through sexual contact.  °RISK FACTORS °· Having unprotected sexual intercourse. °· Having sexual intercourse with an infected partner. °SIGNS AND SYMPTOMS  °Symptoms of trichomoniasis in women include: °· Abnormal gray-green frothy vaginal discharge. °· Itching and irritation of the vagina. °· Itching and irritation of the area outside the vagina. °Symptoms of trichomoniasis in men include:  °· Penile discharge with or without pain. °· Pain during urination. This results from inflammation of the urethra. °DIAGNOSIS  °Trichomoniasis may be found during   a Pap test or physical exam. Your health care provider may use one of the following methods to help diagnose this infection: °· Testing the pH of the vagina with a test tape. °· Using a vaginal swab test that checks for the Trichomonas organism. A test is available that provides results within a few minutes. °· Examining a urine sample. °· Testing vaginal secretions. °Your health care provider may test you for other STIs, including HIV. °TREATMENT  °· You may be given medicine to fight the infection. Women should inform their health care provider if they could be or are pregnant. Some medicines used to treat the infection should not be taken during pregnancy. °· Your health care provider may recommend over-the-counter medicines or creams to decrease itching or irritation. °· Your sexual partner will need to be treated if infected. °· Your health care provider may test you for infection again 3 months after treatment. °HOME CARE INSTRUCTIONS  °· Take medicines only as directed by your health care provider. °· Take over-the-counter medicine for itching or irritation as directed by your health care provider. °· Do not have sexual intercourse while you have the infection. °· Women should not douche or wear tampons while they have the infection. °· Discuss your  infection with your partner. Your partner may have gotten the infection from you, or you may have gotten it from your partner. °· Have your sex partner get examined and treated if necessary. °· Practice safe, informed, and protected sex. °· See your health care provider for other STI testing. °SEEK MEDICAL CARE IF:  °· You still have symptoms after you finish your medicine. °· You develop abdominal pain. °· You have pain when you urinate. °· You have bleeding after sexual intercourse. °· You develop a rash. °· Your medicine makes you sick or makes you throw up (vomit). °MAKE SURE YOU: °· Understand these instructions. °· Will watch your condition. °· Will get help right away if you are not doing well or get worse. °  °This information is not intended to replace advice given to you by your health care provider. Make sure you discuss any questions you have with your health care provider. °  °Document Released: 09/06/2000 Document Revised: 04/03/2014 Document Reviewed: 12/23/2012 °Elsevier Interactive Patient Education ©2016 [employer] Inc. ° °

## 2015-10-21 NOTE — MAU Note (Signed)
Pt received to MAU c/o a fall which occurred on Saturday,7/22. Pt states that she was unable to get a ride to the hospital. Pt appears with significant bruising on her left shoulder and forehead. Pt states that she fell getting out of the shower. Pt also states that she has had an increase in vaginal discharge since the incident.  Carmelina Dane, RN

## 2015-10-21 NOTE — MAU Note (Signed)
Pt states she fell in the shower on Saturday, was unable to come to MAU until today.  Has bruising on L upper arm.  Hx of miscarriages.  Had light pink discharge on Monday, none today.  Denies pain or bright red bleeding.

## 2015-10-22 LAB — GC/CHLAMYDIA PROBE AMP (~~LOC~~) NOT AT ARMC
Chlamydia: NEGATIVE
Neisseria Gonorrhea: NEGATIVE

## 2015-11-09 ENCOUNTER — Ambulatory Visit (INDEPENDENT_AMBULATORY_CARE_PROVIDER_SITE_OTHER): Payer: Medicaid Other | Admitting: Certified Nurse Midwife

## 2015-11-09 ENCOUNTER — Encounter: Payer: Self-pay | Admitting: Certified Nurse Midwife

## 2015-11-09 VITALS — BP 109/74 | HR 88 | Temp 97.7°F | Wt 154.7 lb

## 2015-11-09 DIAGNOSIS — Z62810 Personal history of physical and sexual abuse in childhood: Secondary | ICD-10-CM

## 2015-11-09 DIAGNOSIS — J4522 Mild intermittent asthma with status asthmaticus: Secondary | ICD-10-CM

## 2015-11-09 DIAGNOSIS — O219 Vomiting of pregnancy, unspecified: Secondary | ICD-10-CM

## 2015-11-09 DIAGNOSIS — O09291 Supervision of pregnancy with other poor reproductive or obstetric history, first trimester: Secondary | ICD-10-CM | POA: Diagnosis not present

## 2015-11-09 DIAGNOSIS — N96 Recurrent pregnancy loss: Secondary | ICD-10-CM

## 2015-11-09 DIAGNOSIS — Z331 Pregnant state, incidental: Secondary | ICD-10-CM | POA: Diagnosis not present

## 2015-11-09 DIAGNOSIS — Z1389 Encounter for screening for other disorder: Secondary | ICD-10-CM

## 2015-11-09 HISTORY — DX: Recurrent pregnancy loss: N96

## 2015-11-09 HISTORY — DX: Personal history of physical and sexual abuse in childhood: Z62.810

## 2015-11-09 LAB — POCT URINALYSIS DIPSTICK
BILIRUBIN UA: NEGATIVE
Blood, UA: NEGATIVE
GLUCOSE UA: NEGATIVE
KETONES UA: NEGATIVE
Leukocytes, UA: NEGATIVE
Nitrite, UA: NEGATIVE
PROTEIN UA: NEGATIVE
SPEC GRAV UA: 1.01
UROBILINOGEN UA: 0.2
pH, UA: 8

## 2015-11-09 MED ORDER — DOXYLAMINE-PYRIDOXINE 10-10 MG PO TBEC: DELAYED_RELEASE_TABLET | ORAL | 4 refills | Status: DC

## 2015-11-09 MED ORDER — PRENATE PIXIE 10-0.6-0.4-200 MG PO CAPS: 1.0000 | ORAL_CAPSULE | Freq: Every day | ORAL | 12 refills | Status: DC

## 2015-11-09 MED ORDER — ALBUTEROL SULFATE HFA 108 (90 BASE) MCG/ACT IN AERS: 1.0000 | INHALATION_SPRAY | Freq: Four times a day (QID) | RESPIRATORY_TRACT | 99 refills | Status: DC | PRN
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
## 2015-11-09 NOTE — Addendum Note (Signed)
Addended by: Marya LandryFOSTER, Ugochi Henzler D on: 11/09/2015 04:10 PM   Modules accepted: Orders
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
## 2015-11-10 ENCOUNTER — Telehealth: Payer: Self-pay | Admitting: *Deleted

## 2015-11-10 NOTE — Telephone Encounter (Signed)
PA for Diclegis has been submitted and approved. Pharmacy made aware. 

## 2015-11-11 LAB — CULTURE, OB URINE

## 2015-11-11 LAB — URINE CULTURE, OB REFLEX: Organism ID, Bacteria: NO GROWTH

## 2015-11-12 ENCOUNTER — Other Ambulatory Visit: Payer: Self-pay | Admitting: Certified Nurse Midwife

## 2015-11-12 DIAGNOSIS — B3731 Acute candidiasis of vulva and vagina: Secondary | ICD-10-CM

## 2015-11-12 DIAGNOSIS — A599 Trichomoniasis, unspecified: Secondary | ICD-10-CM

## 2015-11-12 DIAGNOSIS — B373 Candidiasis of vulva and vagina: Secondary | ICD-10-CM

## 2015-11-12 LAB — NUSWAB VG+, CANDIDA 6SP
CANDIDA GLABRATA, NAA: NEGATIVE
CANDIDA KRUSEI, NAA: NEGATIVE
CANDIDA LUSITANIAE, NAA: NEGATIVE
CANDIDA PARAPSILOSIS, NAA: NEGATIVE
CANDIDA TROPICALIS, NAA: NEGATIVE
Candida albicans, NAA: POSITIVE — AB
Chlamydia trachomatis, NAA: NEGATIVE
Neisseria gonorrhoeae, NAA: NEGATIVE
Trich vag by NAA: POSITIVE — AB

## 2015-11-12 LAB — PAP IG W/ RFLX HPV ASCU: PAP Smear Comment: 0

## 2015-11-12 MED ORDER — METRONIDAZOLE 500 MG PO TABS: 2000.0000 mg | ORAL_TABLET | Freq: Once | ORAL | 0 refills | Status: DC

## 2015-11-12 MED ORDER — TERCONAZOLE 0.8 % VA CREA: 1.0000 | TOPICAL_CREAM | Freq: Every day | VAGINAL | 0 refills | Status: DC

## 2015-11-16 ENCOUNTER — Encounter (HOSPITAL_COMMUNITY): Payer: Self-pay | Admitting: Certified Nurse Midwife

## 2015-11-17 ENCOUNTER — Other Ambulatory Visit: Payer: Self-pay | Admitting: Certified Nurse Midwife

## 2015-11-17 DIAGNOSIS — O0990 Supervision of high risk pregnancy, unspecified, unspecified trimester: Secondary | ICD-10-CM | POA: Insufficient documentation

## 2015-11-17 DIAGNOSIS — O0991 Supervision of high risk pregnancy, unspecified, first trimester: Secondary | ICD-10-CM

## 2015-11-17 LAB — PRENATAL PROFILE I(LABCORP)
ANTIBODY SCREEN: NEGATIVE
BASOS ABS: 0 10*3/uL (ref 0.0–0.2)
Basos: 0 %
EOS (ABSOLUTE): 0.1 10*3/uL (ref 0.0–0.4)
EOS: 0 %
HEP B S AG: NEGATIVE
Hematocrit: 40.4 % (ref 34.0–46.6)
Hemoglobin: 13.3 g/dL (ref 11.1–15.9)
IMMATURE GRANULOCYTES: 0 %
Immature Grans (Abs): 0 10*3/uL (ref 0.0–0.1)
LYMPHS ABS: 3 10*3/uL (ref 0.7–3.1)
Lymphs: 24 %
MCH: 30 pg (ref 26.6–33.0)
MCHC: 32.9 g/dL (ref 31.5–35.7)
MCV: 91 fL (ref 79–97)
MONOCYTES: 7 %
Monocytes Absolute: 0.8 10*3/uL (ref 0.1–0.9)
NEUTROS ABS: 8.7 10*3/uL — AB (ref 1.4–7.0)
NEUTROS PCT: 69 %
PLATELETS: 202 10*3/uL (ref 150–379)
RBC: 4.44 x10E6/uL (ref 3.77–5.28)
RDW: 13.6 % (ref 12.3–15.4)
RH TYPE: POSITIVE
RPR Ser Ql: NONREACTIVE
RUBELLA: 3.69 {index} (ref >0.99)
WBC: 12.7 10*3/uL — ABNORMAL HIGH (ref 3.4–10.8)

## 2015-11-17 LAB — MATERNIT21 PLUS CORE+SCA
CHROMOSOME 21: NEGATIVE
Chromosome 13: NEGATIVE
Chromosome 18: NEGATIVE
PDF: 0
Y CHROMOSOME: NOT DETECTED

## 2015-11-17 LAB — HEMOGLOBINOPATHY EVALUATION
HEMOGLOBIN A2 QUANTITATION: 2.4 % (ref 0.7–3.1)
HGB A: 97.6 % (ref 94.0–98.0)
HGB C: 0 %
HGB S: 0 %
Hemoglobin F Quantitation: 0 % (ref 0.0–2.0)

## 2015-11-17 LAB — HIV ANTIBODY (ROUTINE TESTING W REFLEX): HIV Screen 4th Generation wRfx: NONREACTIVE

## 2015-11-17 LAB — TSH: TSH: 2.09 u[IU]/mL (ref 0.450–4.500)

## 2015-11-17 LAB — VARICELLA ZOSTER ANTIBODY, IGG: Varicella zoster IgG: 1901 index (ref >165)

## 2015-11-18 ENCOUNTER — Telehealth: Payer: Self-pay | Admitting: *Deleted

## 2015-11-18 NOTE — Telephone Encounter (Signed)
Patient made aware of lab results and medication at pharmacy.Patient aware no sex until both partners have completed medication.

## 2015-11-19 ENCOUNTER — Telehealth: Payer: Self-pay | Admitting: *Deleted

## 2015-11-19 NOTE — Telephone Encounter (Signed)
Pt call to office asking for yeast treatment after antibiotic. Review of chart shows that Terazol cream has been sent to pharmacy.  Attempt to contact pt. LM on VM making pt aware Rx should be at pharmacy.

## 2015-11-23 ENCOUNTER — Other Ambulatory Visit (HOSPITAL_COMMUNITY): Payer: Self-pay | Admitting: Obstetrics and Gynecology

## 2015-11-23 ENCOUNTER — Encounter (HOSPITAL_COMMUNITY): Payer: Self-pay

## 2015-11-23 ENCOUNTER — Ambulatory Visit (HOSPITAL_COMMUNITY)
Admission: RE | Admit: 2015-11-23 | Discharge: 2015-11-23 | Disposition: A | Discharge status: Home / Self Care | Payer: Medicaid Other | Source: Ambulatory Visit | Attending: Certified Nurse Midwife | Admitting: Certified Nurse Midwife

## 2015-11-23 ENCOUNTER — Other Ambulatory Visit: Payer: Self-pay | Admitting: Certified Nurse Midwife

## 2015-11-23 VITALS — BP 102/68 | HR 94 | Wt 152.2 lb

## 2015-11-23 DIAGNOSIS — N96 Recurrent pregnancy loss: Secondary | ICD-10-CM

## 2015-11-23 DIAGNOSIS — Z3A13 13 weeks gestation of pregnancy: Secondary | ICD-10-CM | POA: Diagnosis not present

## 2015-11-23 DIAGNOSIS — Z36 Encounter for antenatal screening of mother: Secondary | ICD-10-CM | POA: Diagnosis not present

## 2015-11-23 DIAGNOSIS — O09291 Supervision of pregnancy with other poor reproductive or obstetric history, first trimester: Secondary | ICD-10-CM

## 2015-11-23 DIAGNOSIS — Z62810 Personal history of physical and sexual abuse in childhood: Secondary | ICD-10-CM

## 2015-11-23 DIAGNOSIS — Z3682 Encounter for antenatal screening for nuchal translucency: Secondary | ICD-10-CM

## 2015-11-23 MED ORDER — ASPIRIN EC 81 MG PO TBEC: 81.0000 mg | DELAYED_RELEASE_TABLET | Freq: Every day | ORAL | 4 refills | Status: DC

## 2015-11-23 NOTE — Progress Notes (Signed)
MFM Consult:  Impressions: SIUP at 5744w0d, RPL active singleton fetus fetal morphology is gestational age appropriate NT =2.672mm nasal bone present Low risk NIPS   Discussion: The patient and I went over the known and suspected etiologies of recurrent miscarriage. In regard to autoimmune conditions resulting in miscarriage, the most well described is antiphospholipid syndrome. I discussed the nature of autoimmune conditions in general and antiphospholipid syndrome per se. I described the diagnostic criteria for antiphospholipid syndrome, including antibody markers and clinical criteria. Finally, I explained that the literature indicates that some 5-15 percent of women with recurrent miscarriage are found to have antiphospholipid antibodies. Accordingly, I obtained this work up today.  Additionally, anatomic (structural) malformations of the uterus as a cause for pregnancy loss. Diagnosis of uterine malformation usually involves hysterosalpingogram, hysteroscopy, or sonohystogram. Given her current state of pregnancy we are limited to ultrasound today that demonstrates an apparently normal uterine cavity. Should miscarriage happen again, I would recommend formal referral to reproductive endocrinology for hysterosalpingogram or sonohystogram along with parental genotyping.  I talked to the patient about possible thrombophilic causes for recurrent pregnancy loss, explaining that there is considerable controversy about whether or not they contribute to recurrent early miscarriage (pre-embryonic and embryonic losses). I mentioned, however, that thrombophilias were better accepted as a "cause" of fetal death. I also explained our developing understanding of the role of thrombophilic mutations in recurrent pregnancy loss (and third-trimester complications), including the presumed underlying pathophysiology. Regardless, ASA 81 mg po qd empirically may aid in placental establishment and reduce chance of  insufficient decidualization leading to miscarriage.  Recommendations: 1. ASA 81mg  po qd until 37 weeks 2. APS panel drawn today 3. fetal survey in 5 weeks.  Time Spent: I spent in excess of 30 minutes in consultation with this patient to review records, evaluate her case, and provide her with an adequate discussion and education.  More than 50% of this time was spent in direct face-to-face counseling.  It was a pleasure seeing your patient in the office today.  Thank you for consultation. Please do not hesitate to contact our service for any further questions.   Thank you,  Louann SjogrenJeffrey Morgan Gaynelle Arabianenney   Denney, Louann SjogrenJeffrey Morgan, MD, MS, FACOG Assistant Professor Section of Maternal-Fetal Medicine [facility]

## 2015-11-24 ENCOUNTER — Other Ambulatory Visit (HOSPITAL_COMMUNITY): Payer: PRIVATE HEALTH INSURANCE

## 2015-11-24 LAB — BETA-2-GLYCOPROTEIN I ABS, IGG/M/A
Beta-2-Glycoprotein I IgA: <9 GPI IgA units (ref 0–25)
Beta-2-Glycoprotein I IgM: <9 GPI IgM units (ref 0–32)

## 2015-11-24 LAB — CARDIOLIPIN ANTIBODIES, IGG, IGM, IGA
ANTICARDIOLIPIN IGM: 9 [MPL'U]/mL (ref 0–12)
Anticardiolipin IgG: <9 GPL U/mL (ref 0–14)

## 2015-11-24 LAB — LUPUS ANTICOAGULANT PANEL
DRVVT: 33.1 s (ref 0.0–47.0)
PTT LA: 26.7 s (ref 0.0–51.9)

## 2015-12-07 ENCOUNTER — Ambulatory Visit (INDEPENDENT_AMBULATORY_CARE_PROVIDER_SITE_OTHER): Payer: Medicaid Other | Admitting: Certified Nurse Midwife

## 2015-12-07 DIAGNOSIS — O0992 Supervision of high risk pregnancy, unspecified, second trimester: Secondary | ICD-10-CM | POA: Diagnosis not present

## 2015-12-07 NOTE — Patient Instructions (Addendum)
Second Trimester of Pregnancy The second trimester is from week 13 through week 28, months 4 through 6. The second trimester is often a time when you feel your best. Your body has also adjusted to being pregnant, and you begin to feel better physically. Usually, morning sickness has lessened or quit completely, you may have more energy, and you may have an increase in appetite. The second trimester is also a time when the fetus is growing rapidly. At the end of the sixth month, the fetus is about 9 inches long and weighs about 1 pounds. You will likely begin to feel the baby move (quickening) between 18 and 20 weeks of the pregnancy. BODY CHANGES Your body goes through many changes during pregnancy. The changes vary from woman to woman.   Your weight will continue to increase. You will notice your lower abdomen bulging out.  You may begin to get stretch marks on your hips, abdomen, and breasts.  You may develop headaches that can be relieved by medicines approved by your health care provider.  You may urinate more often because the fetus is pressing on your bladder.  You may develop or continue to have heartburn as a result of your pregnancy.  You may develop constipation because certain hormones are causing the muscles that push waste through your intestines to slow down.  You may develop hemorrhoids or swollen, bulging veins (varicose veins).  You may have back pain because of the weight gain and pregnancy hormones relaxing your joints between the bones in your pelvis and as a result of a shift in weight and the muscles that support your balance.  Your breasts will continue to grow and be tender.  Your gums may bleed and may be sensitive to brushing and flossing.  Dark spots or blotches (chloasma, mask of pregnancy) may develop on your face. This will likely fade after the baby is born.  A dark line from your belly button to the pubic area (linea nigra) may appear. This will likely fade  after the baby is born.  You may have changes in your hair. These can include thickening of your hair, rapid growth, and changes in texture. Some women also have hair loss during or after pregnancy, or hair that feels dry or thin. Your hair will most likely return to normal after your baby is born. WHAT TO EXPECT AT YOUR PRENATAL VISITS During a routine prenatal visit:  You will be weighed to make sure you and the fetus are growing normally.  Your blood pressure will be taken.  Your abdomen will be measured to track your baby's growth.  The fetal heartbeat will be listened to.  Any test results from the previous visit will be discussed. Your health care provider may ask you:  How you are feeling.  If you are feeling the baby move.  If you have had any abnormal symptoms, such as leaking fluid, bleeding, severe headaches, or abdominal cramping.  If you are using any tobacco products, including cigarettes, chewing tobacco, and electronic cigarettes.  If you have any questions. Other tests that may be performed during your second trimester include:  Blood tests that check for:  Low iron levels (anemia).  Gestational diabetes (between 24 and 28 weeks).  Rh antibodies.  Urine tests to check for infections, diabetes, or protein in the urine.  An ultrasound to confirm the proper growth and development of the baby.  An amniocentesis to check for possible genetic problems.  Fetal screens for spina bifida   and Down syndrome.  HIV (human immunodeficiency virus) testing. Routine prenatal testing includes screening for HIV, unless you choose not to have this test. HOME CARE INSTRUCTIONS   Avoid all smoking, herbs, alcohol, and unprescribed drugs. These chemicals affect the formation and growth of the baby.  Do not use any tobacco products, including cigarettes, chewing tobacco, and electronic cigarettes. If you need help quitting, ask your health care provider. You may receive  counseling support and other resources to help you quit.  Follow your health care provider's instructions regarding medicine use. There are medicines that are either safe or unsafe to take during pregnancy.  Exercise only as directed by your health care provider. Experiencing uterine cramps is a good sign to stop exercising.  Continue to eat regular, healthy meals.  Wear a good support bra for breast tenderness.  Do not use hot tubs, steam rooms, or saunas.  Wear your seat belt at all times when driving.  Avoid raw meat, uncooked cheese, cat litter boxes, and soil used by cats. These carry germs that can cause birth defects in the baby.  Take your prenatal vitamins.  Take 1500-2000 mg of calcium daily starting at the 20th week of pregnancy until you deliver your baby.  Try taking a stool softener (if your health care provider approves) if you develop constipation. Eat more high-fiber foods, such as fresh vegetables or fruit and whole grains. Drink plenty of fluids to keep your urine clear or pale yellow.  Take warm sitz baths to soothe any pain or discomfort caused by hemorrhoids. Use hemorrhoid cream if your health care provider approves.  If you develop varicose veins, wear support hose. Elevate your feet for 15 minutes, 3-4 times a day. Limit salt in your diet.  Avoid heavy lifting, wear low heel shoes, and practice good posture.  Rest with your legs elevated if you have leg cramps or low back pain.  Visit your dentist if you have not gone yet during your pregnancy. Use a soft toothbrush to brush your teeth and be gentle when you floss.  A sexual relationship may be continued unless your health care provider directs you otherwise.  Continue to go to all your prenatal visits as directed by your health care provider. SEEK MEDICAL CARE IF:   You have dizziness.  You have mild pelvic cramps, pelvic pressure, or nagging pain in the abdominal area.  You have persistent nausea,  vomiting, or diarrhea.  You have a bad smelling vaginal discharge.  You have pain with urination. SEEK IMMEDIATE MEDICAL CARE IF:   You have a fever.  You are leaking fluid from your vagina.  You have spotting or bleeding from your vagina.  You have severe abdominal cramping or pain.  You have rapid weight gain or loss.  You have shortness of breath with chest pain.  You notice sudden or extreme swelling of your face, hands, ankles, feet, or legs.  You have not felt your baby move in over an hour.  You have severe headaches that do not go away with medicine.  You have vision changes.   This information is not intended to replace advice given to you by your health care provider. Make sure you discuss any questions you have with your health care provider.   Document Released: 03/07/2001 Document Revised: 04/03/2014 Document Reviewed: 05/14/2012 Elsevier Interactive Patient Education 2016 [employer]. How a Baby Grows During Pregnancy Pregnancy begins when a female's sperm enters a female's egg (fertilization). This happens in one of  the tubes (fallopian tubes) that connect the ovaries to the womb (uterus). The fertilized egg is called an embryo until it reaches 10 weeks. From 10 weeks until birth, it is called a fetus. The fertilized egg moves down the fallopian tube to the uterus. Then it implants into the lining of the uterus and begins to grow. The developing fetus receives oxygen and nutrients through the pregnant woman's bloodstream and the tissues that grow (placenta) to support the fetus. The placenta is the life support system for the fetus. It provides nutrition and removes waste. Learning as much as you can about your pregnancy and how your baby is developing can help you enjoy the experience. It can also make you aware of when there might be a problem and when to ask questions. HOW LONG DOES A TYPICAL PREGNANCY LAST? A pregnancy usually lasts 280 days, or about 40 weeks.  Pregnancy is divided into three trimesters:  First trimester: 0-13 weeks.  Second trimester: 14-27 weeks.  Third trimester: 28-40 weeks. The day when your baby is considered ready to be born (full term) is your estimated date of delivery. HOW DOES MY BABY DEVELOP MONTH BY MONTH? First month  The fertilized egg attaches to the inside of the uterus.  Some cells will form the placenta. Others will form the fetus.  The arms, legs, brain, spinal cord, lungs, and heart begin to develop.  At the end of the first month, the heart begins to beat. Second month  The bones, inner ear, eyelids, hands, and feet form.  The genitals develop.  By the end of 8 weeks, all major organs are developing. Third month  All of the internal organs are forming.  Teeth develop below the gums.  Bones and muscles begin to grow. The spine can flex.  The skin is transparent.  Fingernails and toenails begin to form.  Arms and legs continue to grow longer, and hands and feet develop.  The fetus is about 3 in (7.6 cm) long. Fourth month  The placenta is completely formed.  The external sex organs, neck, outer ear, eyebrows, eyelids, and fingernails are formed.  The fetus can hear, swallow, and move its arms and legs.  The kidneys begin to produce urine.  The skin is covered with a white waxy coating (vernix) and very fine hair (lanugo). Fifth month  The fetus moves around more and can be felt for the first time (quickening).  The fetus starts to sleep and wake up and may begin to suck its finger.  The nails grow to the end of the fingers.  The organ in the digestive system that makes bile (gallbladder) functions and helps to digest the nutrients.  If your baby is a girl, eggs are present in her ovaries. If your baby is a boy, testicles start to move down into his scrotum. Sixth month  The lungs are formed, but the fetus is not yet able to breathe.  The eyes open. The brain continues to  develop.  Your baby has fingerprints and toe prints. Your baby's hair grows thicker.  At the end of the second trimester, the fetus is about 9 in (22.9 cm) long. Seventh month  The fetus kicks and stretches.  The eyes are developed enough to sense changes in light.  The hands can make a grasping motion.  The fetus responds to sound. Eighth month  All organs and body systems are fully developed and functioning.  Bones harden and taste buds develop. The fetus may hiccup.  Certain areas of the brain are still developing. The skull remains soft. Ninth month  The fetus gains about  lb (0.23 kg) each week.  The lungs are fully developed.  Patterns of sleep develop.  The fetus's head typically moves into a head-down position (vertex) in the uterus to prepare for birth. If the buttocks move into a vertex position instead, the baby is breech.  The fetus weighs 6-9 lbs (2.72-4.08 kg) and is 19-20 in (48.26-50.8 cm) long. WHAT CAN I DO TO HAVE A HEALTHY PREGNANCY AND HELP MY BABY DEVELOP? Eating and Drinking  Eat a healthy diet.  Talk with your health care provider to make sure that you are getting the nutrients that you and your baby need.  Visit www.[employer].bechoosemyplate.gov to learn about creating a healthy diet.  Gain a healthy amount of weight during pregnancy as advised by your health care provider. This is usually 25-35 pounds. You may need to:  Gain more if you were underweight before getting pregnant or if you are pregnant with more than one baby.  Gain less if you were overweight or obese when you got pregnant. Medicines and Vitamins  Take prenatal vitamins as directed by your health care provider. These include vitamins such as folic acid, iron, calcium, and vitamin D. They are important for healthy development.  Take medicines only as directed by your health care provider. Read labels and ask a pharmacist or your health care provider whether over-the-counter medicines,  supplements, and prescription drugs are safe to take during pregnancy. Activities  Be physically active as advised by your health care provider. Ask your health care provider to recommend activities that are safe for you to do, such as walking or swimming.  Do not participate in strenuous or extreme sports. Lifestyle  Do not drink alcohol.  Do not use any tobacco products, including cigarettes, chewing tobacco, or electronic cigarettes. If you need help quitting, ask your health care provider.  Do not use illegal drugs. Safety  Avoid exposure to mercury, lead, or other heavy metals. Ask your health care provider about common sources of these heavy metals.  Avoid listeria infection during pregnancy. Follow these precautions:  Do not eat soft cheeses or deli meats.  Do not eat hot dogs unless they have been warmed up to the point of steaming, such as in the microwave oven.  Do not drink unpasteurized milk.  Avoid toxoplasmosis infection during pregnancy. Follow these precautions:  Do not change your cat's litter box, if you have a cat. Ask someone else to do this for you.  Wear gardening gloves while working in the yard. General Instructions  Keep all follow-up visits as directed by your health care provider. This is important. This includes prenatal care and screening tests.  Manage any chronic health conditions. Work closely with your health care provider to keep conditions, such as diabetes, under control. HOW DO I KNOW IF MY BABY IS DEVELOPING WELL? At each prenatal visit, your health care provider will do several different tests to check on your health and keep track of your baby's development. These include:  Fundal height.  Your health care provider will measure your growing belly from top to bottom using a tape measure.  Your health care provider will also feel your belly to determine your baby's position.  Heartbeat.  An ultrasound in the first trimester can  confirm pregnancy and show a heartbeat, depending on how far along you are.  Your health care provider will check your baby's heart  rate at every prenatal visit.  As you get closer to your delivery date, you may have regular fetal heart rate monitoring to make sure that your baby is not in distress.  Second trimester ultrasound.  This ultrasound checks your baby's development. It also indicates your baby's gender. WHAT SHOULD I DO IF I HAVE CONCERNS ABOUT MY BABY'S DEVELOPMENT? Always talk with your health care provider about any concerns that you may have.   This information is not intended to replace advice given to you by your health care provider. Make sure you discuss any questions you have with your health care provider.   Document Released: 08/30/2007 Document Revised: 12/02/2014 Document Reviewed: 08/20/2013 Elsevier Interactive Patient Education [employer]
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
## 2016-01-01 ENCOUNTER — Inpatient Hospital Stay (HOSPITAL_COMMUNITY)
Admission: AD | Admit: 2016-01-01 | Discharge: 2016-01-01 | Disposition: A | Discharge status: Home / Self Care | Payer: Medicaid Other | Source: Ambulatory Visit | Attending: Obstetrics and Gynecology | Admitting: Obstetrics and Gynecology

## 2016-01-01 ENCOUNTER — Encounter (HOSPITAL_COMMUNITY): Payer: Self-pay | Admitting: Certified Nurse Midwife

## 2016-01-01 DIAGNOSIS — R1032 Left lower quadrant pain: Secondary | ICD-10-CM

## 2016-01-01 DIAGNOSIS — Z3A19 19 weeks gestation of pregnancy: Secondary | ICD-10-CM | POA: Diagnosis not present

## 2016-01-01 DIAGNOSIS — O9989 Other specified diseases and conditions complicating pregnancy, childbirth and the puerperium: Secondary | ICD-10-CM | POA: Diagnosis not present

## 2016-01-01 DIAGNOSIS — Z87891 Personal history of nicotine dependence: Secondary | ICD-10-CM | POA: Diagnosis not present

## 2016-01-01 DIAGNOSIS — O2621 Pregnancy care for patient with recurrent pregnancy loss, first trimester: Secondary | ICD-10-CM

## 2016-01-01 DIAGNOSIS — Z3A18 18 weeks gestation of pregnancy: Secondary | ICD-10-CM | POA: Insufficient documentation

## 2016-01-01 DIAGNOSIS — O26892 Other specified pregnancy related conditions, second trimester: Secondary | ICD-10-CM | POA: Diagnosis not present

## 2016-01-01 LAB — URINALYSIS, ROUTINE W REFLEX MICROSCOPIC
Bilirubin Urine: NEGATIVE
Glucose, UA: NEGATIVE mg/dL
Hgb urine dipstick: NEGATIVE
KETONES UR: 15 mg/dL — AB
LEUKOCYTES UA: NEGATIVE
NITRITE: NEGATIVE
PROTEIN: NEGATIVE mg/dL
SPECIFIC GRAVITY, URINE: 1.02 (ref 1.005–1.030)
pH: 6.5 (ref 5.0–8.0)

## 2016-01-01 LAB — WET PREP, GENITAL
CLUE CELLS WET PREP: NONE SEEN
Sperm: NONE SEEN
TRICH WET PREP: NONE SEEN
YEAST WET PREP: NONE SEEN

## 2016-01-01 MED ORDER — BUTALBITAL-APAP-CAFFEINE 50-325-40 MG PO TABS
2.0000 | ORAL_TABLET | Freq: Once | ORAL | Status: AC
  Administered 2016-01-01: 2 via ORAL
  Filled 2016-01-01: qty 2

## 2016-01-01 NOTE — Discharge Instructions (Signed)

## 2016-01-01 NOTE — MAU Note (Signed)
Pt states she has left sided pain that started 30 minutes and some brown blood when she wiped. Pt denies cramping or LOF.
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
## 2016-01-03 LAB — GC/CHLAMYDIA PROBE AMP (~~LOC~~) NOT AT ARMC
Chlamydia: NEGATIVE
NEISSERIA GONORRHEA: NEGATIVE

## 2016-01-04 ENCOUNTER — Observation Stay (HOSPITAL_COMMUNITY)
Admission: AD | Admit: 2016-01-04 | Discharge: 2016-01-05 | Disposition: A | Discharge status: Home / Self Care | Payer: Medicaid Other | Source: Ambulatory Visit | Attending: Obstetrics and Gynecology | Admitting: Obstetrics and Gynecology

## 2016-01-04 ENCOUNTER — Inpatient Hospital Stay (HOSPITAL_COMMUNITY): Payer: Medicaid Other

## 2016-01-04 ENCOUNTER — Encounter (HOSPITAL_COMMUNITY): Payer: Self-pay

## 2016-01-04 DIAGNOSIS — O4412 Placenta previa with hemorrhage, second trimester: Principal | ICD-10-CM | POA: Insufficient documentation

## 2016-01-04 DIAGNOSIS — J45909 Unspecified asthma, uncomplicated: Secondary | ICD-10-CM | POA: Diagnosis not present

## 2016-01-04 DIAGNOSIS — O4440 Low lying placenta NOS or without hemorrhage, unspecified trimester: Secondary | ICD-10-CM | POA: Diagnosis present

## 2016-01-04 DIAGNOSIS — O4692 Antepartum hemorrhage, unspecified, second trimester: Secondary | ICD-10-CM | POA: Diagnosis present

## 2016-01-04 DIAGNOSIS — Z3A19 19 weeks gestation of pregnancy: Secondary | ICD-10-CM | POA: Diagnosis not present

## 2016-01-04 DIAGNOSIS — O4402 Placenta previa specified as without hemorrhage, second trimester: Secondary | ICD-10-CM | POA: Diagnosis not present

## 2016-01-04 DIAGNOSIS — Z7982 Long term (current) use of aspirin: Secondary | ICD-10-CM | POA: Insufficient documentation

## 2016-01-04 DIAGNOSIS — O99512 Diseases of the respiratory system complicating pregnancy, second trimester: Secondary | ICD-10-CM | POA: Diagnosis not present

## 2016-01-04 DIAGNOSIS — Z87891 Personal history of nicotine dependence: Secondary | ICD-10-CM | POA: Diagnosis not present

## 2016-01-04 LAB — CBC
HEMATOCRIT: 36.9 % (ref 36.0–46.0)
HEMOGLOBIN: 12.6 g/dL (ref 12.0–15.0)
MCH: 30.7 pg (ref 26.0–34.0)
MCHC: 34.1 g/dL (ref 30.0–36.0)
MCV: 90 fL (ref 78.0–100.0)
PLATELETS: 189 10*3/uL (ref 150–400)
RBC: 4.1 MIL/uL (ref 3.87–5.11)
RDW: 13.9 % (ref 11.5–15.5)
WBC: 12.3 10*3/uL — AB (ref 4.0–10.5)

## 2016-01-04 LAB — URINALYSIS, ROUTINE W REFLEX MICROSCOPIC
BILIRUBIN URINE: NEGATIVE
GLUCOSE, UA: NEGATIVE mg/dL
KETONES UR: NEGATIVE mg/dL
Leukocytes, UA: NEGATIVE
Nitrite: NEGATIVE
PROTEIN: NEGATIVE mg/dL
Specific Gravity, Urine: 1.025 (ref 1.005–1.030)
pH: 6 (ref 5.0–8.0)

## 2016-01-04 LAB — URINE MICROSCOPIC-ADD ON
BACTERIA UA: NONE SEEN
RBC / HPF: NONE SEEN RBC/hpf (ref 0–5)
WBC, UA: NONE SEEN WBC/hpf (ref 0–5)

## 2016-01-04 LAB — TYPE AND SCREEN
ABO/RH(D): A POS
ANTIBODY SCREEN: NEGATIVE

## 2016-01-04 MED ORDER — ZOLPIDEM TARTRATE 5 MG PO TABS
5.0000 mg | ORAL_TABLET | Freq: Every evening | ORAL | Status: DC | PRN
  Administered 2016-01-05: 5 mg via ORAL
  Filled 2016-01-04: qty 1

## 2016-01-04 MED ORDER — CALCIUM CARBONATE ANTACID 500 MG PO CHEW: 2.0000 | CHEWABLE_TABLET | ORAL | Status: DC | PRN

## 2016-01-04 MED ORDER — DOCUSATE SODIUM 100 MG PO CAPS: 100.0000 mg | ORAL_CAPSULE | Freq: Every day | ORAL | Status: DC

## 2016-01-04 MED ORDER — ACETAMINOPHEN 325 MG PO TABS: 650.0000 mg | ORAL_TABLET | ORAL | Status: DC | PRN

## 2016-01-04 MED ORDER — SODIUM CHLORIDE 0.9% FLUSH
3.0000 mL | Freq: Two times a day (BID) | INTRAVENOUS | Status: DC
  Administered 2016-01-04: 3 mL via INTRAVENOUS

## 2016-01-04 MED ORDER — SODIUM CHLORIDE 0.9% FLUSH: 3.0000 mL | INTRAVENOUS | Status: DC | PRN

## 2016-01-04 MED ORDER — PRENATAL MULTIVITAMIN CH: 1.0000 | ORAL_TABLET | Freq: Every day | ORAL | Status: DC

## 2016-01-04 MED ORDER — ACETAMINOPHEN 500 MG PO TABS
1000.0000 mg | ORAL_TABLET | ORAL | Status: DC | PRN
  Administered 2016-01-04: 1000 mg via ORAL
  Filled 2016-01-04: qty 2

## 2016-01-04 MED ORDER — SODIUM CHLORIDE 0.9 % IV SOLN: 250.0000 mL | INTRAVENOUS | Status: DC | PRN

## 2016-01-04 NOTE — MAU Note (Signed)
Report given to Janine RN

## 2016-01-04 NOTE — MAU Note (Signed)
Pt had spotting two days ago. Had intercourse 01/03/16 and has been bleeding heavy since. Changing a pad an hour and having to change clothes.
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
## 2016-01-05 DIAGNOSIS — O4402 Placenta previa specified as without hemorrhage, second trimester: Secondary | ICD-10-CM | POA: Diagnosis not present

## 2016-01-05 DIAGNOSIS — Z3A19 19 weeks gestation of pregnancy: Secondary | ICD-10-CM | POA: Diagnosis not present

## 2016-01-05 LAB — ABO/RH: ABO/RH(D): A POS

## 2016-01-05 NOTE — Discharge Instructions (Signed)
Placenta Previa Placenta previa is a condition in pregnant women where the placenta implants in the lower part of the uterus. The placenta either partially or completely covers the opening to the cervix. This is a problem because the baby must pass through the cervix during delivery. There are three types of placenta previa. They include:  1. Marginal placenta previa. The placenta is near the cervix, but does not cover the opening. 2. Partial placenta previa. The placenta covers part of the cervical opening. 3. Complete placenta previa. The placenta covers the entire cervical opening.  Depending on the type of placenta previa, there is a chance the placenta may move into a normal position and no longer cover the cervix as the pregnancy progresses. It is important to keep all prenatal visits with your caregiver.  RISK FACTORS You may be more likely to develop placenta previa if you:   Are carrying more than one baby (multiples).   Have an abnormally shaped uterus.   Have scars on the lining of the uterus.   Had previous surgeries involving the uterus, such as a cesarean delivery.   Have delivered a baby previously.   Have a history of placenta previa.   Have smoked or used cocaine during pregnancy.   Are age 35 or older during pregnancy.  SYMPTOMS The main symptom of placenta previa is sudden, painless vaginal bleeding during the second half of pregnancy. The amount of bleeding can be light to very heavy. The bleeding may stop on its own, but almost always returns. Cramping, regular contractions, abdominal pain, and lower back pain can also occur with placenta previa.  DIAGNOSIS Placenta previa can be diagnosed through an ultrasound by finding where the placenta is located. The ultrasound may find placenta previa either during a routine prenatal visit or after vaginal bleeding is noticed. If you are diagnosed with placenta previa, your caregiver may avoid vaginal exams to reduce  the risk of heavy bleeding. There is a chance that placenta previa may not be diagnosed until bleeding occurs during labor.  TREATMENT Specific treatment depends on:   How much you are bleeding or if the bleeding has stopped.  How far along you are in your pregnancy.   The condition of the baby.   The location of the baby and placenta.   The type of placenta previa.  Depending on the factors above, your caregiver may recommend:   Decreased activity.   Bed rest at home or in the hospital.  Pelvic rest. This means no sex, using tampons, douching, pelvic exams, or placing anything into the vagina.  A blood transfusion to replace maternal blood loss.  A cesarean delivery if the bleeding is heavy and cannot be controlled or the placenta completely covers the cervix.  Medication to stop premature labor or mature the fetal lungs if delivery is needed before the pregnancy is full term.  WHEN SHOULD YOU SEEK IMMEDIATE MEDICAL CARE IF YOU ARE SENT HOME WITH PLACENTA PREVIA? Seek immediate medical care if you show any symptoms of placenta previa. You will need to go to the hospital to get checked immediately. Again, those symptoms are:  Sudden, painless vaginal bleeding, even a small amount.  Cramping or regular contractions.  Lower back or abdominal pain.   This information is not intended to replace advice given to you by your health care provider. Make sure you discuss any questions you have with your health care provider.   Document Released: 03/13/2005 Document Revised: 04/03/2014 Document Reviewed: 06/14/2012 Elsevier   Interactive Patient Education 2016 Elsevier Inc[employer]  
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
## 2016-01-05 NOTE — Progress Notes (Signed)
Patient discharged to home for self care. No questions or concerns. Voiced understanding with teachback. Work/school notes given for patient and significant other.
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
## 2016-01-06 ENCOUNTER — Inpatient Hospital Stay (HOSPITAL_COMMUNITY): Payer: Medicaid Other

## 2016-01-06 ENCOUNTER — Ambulatory Visit (INDEPENDENT_AMBULATORY_CARE_PROVIDER_SITE_OTHER): Payer: Medicaid Other | Admitting: Certified Nurse Midwife

## 2016-01-06 ENCOUNTER — Inpatient Hospital Stay (HOSPITAL_COMMUNITY)
Admission: AD | Admit: 2016-01-06 | Discharge: 2016-01-06 | Disposition: A | Discharge status: Home / Self Care | Payer: Medicaid Other | Source: Ambulatory Visit | Attending: Obstetrics & Gynecology | Admitting: Obstetrics & Gynecology

## 2016-01-06 ENCOUNTER — Encounter (HOSPITAL_COMMUNITY): Payer: Self-pay

## 2016-01-06 VITALS — BP 103/65 | HR 74 | Temp 97.7°F | Wt 157.5 lb

## 2016-01-06 DIAGNOSIS — O4412 Placenta previa with hemorrhage, second trimester: Secondary | ICD-10-CM | POA: Diagnosis not present

## 2016-01-06 DIAGNOSIS — Z87891 Personal history of nicotine dependence: Secondary | ICD-10-CM | POA: Diagnosis not present

## 2016-01-06 DIAGNOSIS — Z6790 Unspecified blood type, Rh positive: Secondary | ICD-10-CM | POA: Diagnosis not present

## 2016-01-06 DIAGNOSIS — O4690 Antepartum hemorrhage, unspecified, unspecified trimester: Secondary | ICD-10-CM

## 2016-01-06 DIAGNOSIS — Z3A19 19 weeks gestation of pregnancy: Secondary | ICD-10-CM | POA: Diagnosis not present

## 2016-01-06 DIAGNOSIS — O4692 Antepartum hemorrhage, unspecified, second trimester: Secondary | ICD-10-CM | POA: Diagnosis present

## 2016-01-06 DIAGNOSIS — O4402 Placenta previa specified as without hemorrhage, second trimester: Secondary | ICD-10-CM

## 2016-01-06 DIAGNOSIS — O26892 Other specified pregnancy related conditions, second trimester: Secondary | ICD-10-CM | POA: Diagnosis not present

## 2016-01-06 DIAGNOSIS — O0992 Supervision of high risk pregnancy, unspecified, second trimester: Secondary | ICD-10-CM

## 2016-01-06 DIAGNOSIS — Z7982 Long term (current) use of aspirin: Secondary | ICD-10-CM | POA: Insufficient documentation

## 2016-01-06 LAB — URINALYSIS, ROUTINE W REFLEX MICROSCOPIC
Bilirubin Urine: NEGATIVE
GLUCOSE, UA: NEGATIVE mg/dL
Ketones, ur: 15 mg/dL — AB
LEUKOCYTES UA: NEGATIVE
Nitrite: NEGATIVE
PH: 7 (ref 5.0–8.0)
PROTEIN: NEGATIVE mg/dL
SPECIFIC GRAVITY, URINE: 1.02 (ref 1.005–1.030)

## 2016-01-06 LAB — CBC
HEMATOCRIT: 36 % (ref 36.0–46.0)
HEMOGLOBIN: 12.4 g/dL (ref 12.0–15.0)
MCH: 31 pg (ref 26.0–34.0)
MCHC: 34.4 g/dL (ref 30.0–36.0)
MCV: 90 fL (ref 78.0–100.0)
Platelets: 176 10*3/uL (ref 150–400)
RBC: 4 MIL/uL (ref 3.87–5.11)
RDW: 14 % (ref 11.5–15.5)
WBC: 11.4 10*3/uL — AB (ref 4.0–10.5)

## 2016-01-06 LAB — URINE MICROSCOPIC-ADD ON

## 2016-01-06 MED ORDER — OXYCODONE-ACETAMINOPHEN 5-325 MG PO TABS
1.0000 | ORAL_TABLET | ORAL | 0 refills | Status: DC | PRN
Start: 2016-01-06 — End: 2016-01-06
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
## 2016-01-06 NOTE — MAU Note (Signed)
Pt presents to MAU with complaints of vaginal bleeding. Pt was evaluated in MAU on Oct the 9th after bleeding after intercourse and was diagnosed with placenta previa. Pt went to office this morning for routine visit and when she got home she passed a large clot. Reports sharp pain in her lower abdomen at times.

## 2016-01-07 ENCOUNTER — Encounter: Payer: Self-pay | Admitting: Certified Nurse Midwife

## 2016-01-13 ENCOUNTER — Other Ambulatory Visit: Payer: Self-pay | Admitting: Certified Nurse Midwife

## 2016-01-13 ENCOUNTER — Telehealth: Payer: Self-pay | Admitting: *Deleted

## 2016-01-13 NOTE — Telephone Encounter (Signed)
1:58 Spoke with patient- she is still lightly bleeding and passing stringy clots when she is up and moving. She is supposed to go back to work on Monday. Can she get restrictions to sit and only work 4 hours- or does she need to be working at all? Told patient I would speak to provider.

## 2016-01-13 NOTE — Telephone Encounter (Signed)
Patient was seen ay MAU for bleeding and was taken out of work for 1 week. She would like to discuss being placed on light duty and reducing her hours at work. 10:20 LM on VM to CB.

## 2016-01-13 NOTE — Telephone Encounter (Signed)
Needs to be evaluated.  Placenta Previa.  Thank you. R.Esparanza Krider CNM

## 2016-01-14 ENCOUNTER — Encounter (HOSPITAL_COMMUNITY): Payer: Self-pay

## 2016-01-14 ENCOUNTER — Encounter: Payer: Self-pay | Admitting: *Deleted

## 2016-01-14 ENCOUNTER — Other Ambulatory Visit (HOSPITAL_COMMUNITY): Payer: Self-pay | Admitting: Obstetrics and Gynecology

## 2016-01-14 ENCOUNTER — Ambulatory Visit (HOSPITAL_COMMUNITY)
Admission: RE | Admit: 2016-01-14 | Discharge: 2016-01-14 | Disposition: A | Discharge status: Home / Self Care | Payer: Medicaid Other | Source: Ambulatory Visit | Attending: Certified Nurse Midwife | Admitting: Certified Nurse Midwife

## 2016-01-14 VITALS — BP 109/68 | HR 66 | Wt 160.8 lb

## 2016-01-14 DIAGNOSIS — Z363 Encounter for antenatal screening for malformations: Secondary | ICD-10-CM | POA: Insufficient documentation

## 2016-01-14 DIAGNOSIS — O4412 Placenta previa with hemorrhage, second trimester: Secondary | ICD-10-CM | POA: Insufficient documentation

## 2016-01-14 DIAGNOSIS — Z62810 Personal history of physical and sexual abuse in childhood: Secondary | ICD-10-CM

## 2016-01-14 DIAGNOSIS — N96 Recurrent pregnancy loss: Secondary | ICD-10-CM

## 2016-01-14 DIAGNOSIS — O99342 Other mental disorders complicating pregnancy, second trimester: Secondary | ICD-10-CM | POA: Insufficient documentation

## 2016-01-14 DIAGNOSIS — Z3A20 20 weeks gestation of pregnancy: Secondary | ICD-10-CM | POA: Diagnosis not present

## 2016-01-14 DIAGNOSIS — O4402 Placenta previa specified as without hemorrhage, second trimester: Secondary | ICD-10-CM

## 2016-01-14 NOTE — Telephone Encounter (Signed)
Patient has US today and will come to office afterward to decide if she can return to work. MFM called yesterday and although they are not seeing her- they did put a note on her appointment - that she was still experiencing some bleeding and clotting. She may get sent to MAU from US today - if they feel she needs to be seen.

## 2016-01-14 NOTE — Telephone Encounter (Signed)
Patient came to office and spoke with Dr Clearance CootsHarper. She was written out of work until her next US at which time she will be evaluated.

## 2016-01-21 ENCOUNTER — Telehealth: Payer: Self-pay | Admitting: *Deleted

## 2016-01-21 ENCOUNTER — Inpatient Hospital Stay (HOSPITAL_COMMUNITY): Payer: Medicaid Other

## 2016-01-21 ENCOUNTER — Encounter (HOSPITAL_COMMUNITY): Payer: Self-pay

## 2016-01-21 ENCOUNTER — Inpatient Hospital Stay (HOSPITAL_COMMUNITY)
Admission: AD | Admit: 2016-01-21 | Discharge: 2016-01-22 | DRG: 781 | Disposition: A | Discharge status: Home / Self Care | Payer: Medicaid Other | Source: Ambulatory Visit | Attending: Obstetrics and Gynecology | Admitting: Obstetrics and Gynecology

## 2016-01-21 DIAGNOSIS — O4412 Placenta previa with hemorrhage, second trimester: Principal | ICD-10-CM | POA: Diagnosis present

## 2016-01-21 DIAGNOSIS — Z6790 Unspecified blood type, Rh positive: Secondary | ICD-10-CM | POA: Diagnosis not present

## 2016-01-21 DIAGNOSIS — Z3A21 21 weeks gestation of pregnancy: Secondary | ICD-10-CM

## 2016-01-21 DIAGNOSIS — O26892 Other specified pregnancy related conditions, second trimester: Secondary | ICD-10-CM | POA: Diagnosis present

## 2016-01-21 DIAGNOSIS — N939 Abnormal uterine and vaginal bleeding, unspecified: Secondary | ICD-10-CM

## 2016-01-21 DIAGNOSIS — Z3A28 28 weeks gestation of pregnancy: Secondary | ICD-10-CM | POA: Diagnosis not present

## 2016-01-21 DIAGNOSIS — O0990 Supervision of high risk pregnancy, unspecified, unspecified trimester: Secondary | ICD-10-CM

## 2016-01-21 LAB — CBC
HCT: 36.8 % (ref 36.0–46.0)
Hemoglobin: 12.8 g/dL (ref 12.0–15.0)
MCH: 31.4 pg (ref 26.0–34.0)
MCHC: 34.8 g/dL (ref 30.0–36.0)
MCV: 90.2 fL (ref 78.0–100.0)
PLATELETS: 194 10*3/uL (ref 150–400)
RBC: 4.08 MIL/uL (ref 3.87–5.11)
RDW: 13.5 % (ref 11.5–15.5)
WBC: 12.2 10*3/uL — AB (ref 4.0–10.5)

## 2016-01-21 LAB — TYPE AND SCREEN
ABO/RH(D): A POS
ANTIBODY SCREEN: NEGATIVE

## 2016-01-21 MED ORDER — LACTATED RINGERS IV SOLN: INTRAVENOUS | Status: DC

## 2016-01-21 MED ORDER — CALCIUM CARBONATE ANTACID 500 MG PO CHEW: 2.0000 | CHEWABLE_TABLET | ORAL | Status: DC | PRN

## 2016-01-21 MED ORDER — DOCUSATE SODIUM 100 MG PO CAPS
100.0000 mg | ORAL_CAPSULE | Freq: Every day | ORAL | Status: DC
  Filled 2016-01-21: qty 1

## 2016-01-21 MED ORDER — ACETAMINOPHEN 325 MG PO TABS
650.0000 mg | ORAL_TABLET | ORAL | Status: DC | PRN
  Administered 2016-01-21: 650 mg via ORAL
  Filled 2016-01-21: qty 2

## 2016-01-21 MED ORDER — PRENATAL MULTIVITAMIN CH
1.0000 | ORAL_TABLET | Freq: Every day | ORAL | Status: DC
  Filled 2016-01-21: qty 1

## 2016-01-21 MED ORDER — ZOLPIDEM TARTRATE 5 MG PO TABS
5.0000 mg | ORAL_TABLET | Freq: Every evening | ORAL | Status: DC | PRN
  Administered 2016-01-22: 5 mg via ORAL
  Filled 2016-01-21: qty 1

## 2016-01-21 NOTE — Telephone Encounter (Signed)
Pt called to office stating she has had a large amount of bleeding this morning. Pt states that she has not had bleeding for 3 days prior.  Pt would like to know what to do.  Return call to pt. Pt made aware [facility] her history and current placenta previa.  Pt states that she called nurse line at hospital and was given same advise.

## 2016-01-21 NOTE — MAU Note (Signed)
Per Samara DeistKathryn CNM patient does not want an IV but will consent to blood draw.
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
## 2016-01-21 NOTE — MAU Note (Signed)
Onset of heavy vaginal bleeding since this morning a lot of blood in the toilet, having lower abdominal pain and back pain.

## 2016-01-22 DIAGNOSIS — O4412 Placenta previa with hemorrhage, second trimester: Principal | ICD-10-CM

## 2016-01-22 DIAGNOSIS — Z3A21 21 weeks gestation of pregnancy: Secondary | ICD-10-CM

## 2016-01-22 DIAGNOSIS — Z3A28 28 weeks gestation of pregnancy: Secondary | ICD-10-CM

## 2016-01-22 NOTE — Progress Notes (Signed)
Reviewed discharge instructions with patient.  Patient denies any questions at this time

## 2016-01-22 NOTE — Progress Notes (Signed)
FACULTY PRACTICE ANTEPARTUM COMPREHENSIVE PROGRESS NOTE  Connie Cabrera is a 25 y.o. G4P0030 at 4480w4d who is admitted for bleeding placenta previa.  Estimated Date of Delivery: 05/30/16   Length of Stay:  1 Days. Admitted 01/21/2016  Subjective: Patient reports no bleeding since 930PM last night. She reports she no longer is wearing a pad. She has no other compliants. Denies any abdominal pain. She states she will "likely stay today" but will let us know.  Patient reports good fetal movement.  She reports no uterine contractions, no bleeding and no loss of fluid per vagina.  Vitals:  Blood pressure 116/67, pulse 81, temperature 98.1 F (36.7 C), temperature source Oral, resp. rate 18, height 5\' 7"  (1.702 m), weight 163 lb (73.9 kg), last menstrual period 08/16/2015. Physical Examination: CONSTITUTIONAL: Well-developed, well-nourished female in no acute distress.  HENT:  Normocephalic, atraumatic, External right and left ear normal. Oropharynx is clear and moist EYES: Conjunctivae and EOM are normal. Pupils are equal, round, and reactive to light. No scleral icterus.  NECK: Normal range of motion, supple, no masses SKIN: Skin is warm and dry. No rash noted. Not diaphoretic. No erythema. No pallor. NEUROLGIC: Alert and oriented to person, place, and time. Normal reflexes, muscle tone coordination. No cranial nerve deficit noted. PSYCHIATRIC: Normal mood and affect. Normal behavior. Normal judgment and thought content. CARDIOVASCULAR: Normal heart rate noted, regular rhythm RESPIRATORY: Effort and breath sounds normal, no problems with respiration noted ABDOMEN: Soft, nontender, nondistended, gravid. CERVIX:   not evaluated  Fetal monitoring: FHR: 130 bpm by doppler as patient is pre-viable Uterine activity: no contractions per hour  Results for orders placed or performed during the hospital encounter of 01/21/16 (from the past 48 hour(s))  CBC     Status: Abnormal   Collection Time:  01/21/16  3:17 PM  Result Value Ref Range   WBC 12.2 (H) 4.0 - 10.5 K/uL   RBC 4.08 3.87 - 5.11 MIL/uL   Hemoglobin 12.8 12.0 - 15.0 g/dL   HCT 16.136.8 09.636.0 - 04.546.0 %   MCV 90.2 78.0 - 100.0 fL   MCH 31.4 26.0 - 34.0 pg   MCHC 34.8 30.0 - 36.0 g/dL   RDW 40.913.5 81.111.5 - 91.415.5 %   Platelets 194 150 - 400 K/uL  Type and screen [facility]     Status: None   Collection Time: 01/21/16  3:17 PM  Result Value Ref Range   ABO/RH(D) A POS    Antibody Screen NEG    Sample Expiration 01/24/2016     [place] Mfm Ob Transvaginal  Result Date: 01/21/2016 OBSTETRICAL ULTRASOUND: This exam was performed within a [facility] Department. The OB US report was generated in the AS system, and faxed to the ordering physician.  This report is available in the [employer] PACS. See the AS Obstetric US report via the Image Link.   Current scheduled medications . docusate sodium  100 mg Oral Daily  . prenatal multivitamin  1 tablet Oral Q1200    I have reviewed the patient's current medications.  ASSESSMENT: Active Problems:   Placenta previa with hemorrhage, antepartum, second trimester   PLAN: 1. Bleeding placenta previa. Will plan to continue to monitor. Pt only agreed to a 24-48 hour admission. Advised pt we would like her to stay until tomorrow morning. Patient voiced understanding and would let us know how she felt later today. Patient is RH positive   Continue routine antenatal care.   Jaynie CollinsUGONNA  ANYANWU, MD, Evern CoreFACOG  Attending Obstetrician & Gynecologist Faculty Practice, [facility]

## 2016-01-22 NOTE — Discharge Instructions (Signed)
Placenta Previa Placenta previa is a condition in pregnant women where the placenta implants in the lower part of the uterus. The placenta either partially or completely covers the opening to the cervix. This is a problem because the baby must pass through the cervix during delivery. There are three types of placenta previa. They include:  1. Marginal placenta previa. The placenta is near the cervix, but does not cover the opening. 2. Partial placenta previa. The placenta covers part of the cervical opening. 3. Complete placenta previa. The placenta covers the entire cervical opening.  Depending on the type of placenta previa, there is a chance the placenta may move into a normal position and no longer cover the cervix as the pregnancy progresses. It is important to keep all prenatal visits with your caregiver.  RISK FACTORS You may be more likely to develop placenta previa if you:   Are carrying more than one baby (multiples).   Have an abnormally shaped uterus.   Have scars on the lining of the uterus.   Had previous surgeries involving the uterus, such as a cesarean delivery.   Have delivered a baby previously.   Have a history of placenta previa.   Have smoked or used cocaine during pregnancy.   Are age 35 or older during pregnancy.  SYMPTOMS The main symptom of placenta previa is sudden, painless vaginal bleeding during the second half of pregnancy. The amount of bleeding can be light to very heavy. The bleeding may stop on its own, but almost always returns. Cramping, regular contractions, abdominal pain, and lower back pain can also occur with placenta previa.  DIAGNOSIS Placenta previa can be diagnosed through an ultrasound by finding where the placenta is located. The ultrasound may find placenta previa either during a routine prenatal visit or after vaginal bleeding is noticed. If you are diagnosed with placenta previa, your caregiver may avoid vaginal exams to reduce  the risk of heavy bleeding. There is a chance that placenta previa may not be diagnosed until bleeding occurs during labor.  TREATMENT Specific treatment depends on:   How much you are bleeding or if the bleeding has stopped.  How far along you are in your pregnancy.   The condition of the baby.   The location of the baby and placenta.   The type of placenta previa.  Depending on the factors above, your caregiver may recommend:   Decreased activity.   Bed rest at home or in the hospital.  Pelvic rest. This means no sex, using tampons, douching, pelvic exams, or placing anything into the vagina.  A blood transfusion to replace maternal blood loss.  A cesarean delivery if the bleeding is heavy and cannot be controlled or the placenta completely covers the cervix.  Medication to stop premature labor or mature the fetal lungs if delivery is needed before the pregnancy is full term.  WHEN SHOULD YOU SEEK IMMEDIATE MEDICAL CARE IF YOU ARE SENT HOME WITH PLACENTA PREVIA? Seek immediate medical care if you show any symptoms of placenta previa. You will need to go to the hospital to get checked immediately. Again, those symptoms are:  Sudden, painless vaginal bleeding, even a small amount.  Cramping or regular contractions.  Lower back or abdominal pain.   This information is not intended to replace advice given to you by your health care provider. Make sure you discuss any questions you have with your health care provider.   Document Released: 03/13/2005 Document Revised: 04/03/2014 Document Reviewed: 06/14/2012 Elsevier   Interactive Patient Education 2016 Elsevier Inc[employer]  
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
## 2016-01-24 ENCOUNTER — Ambulatory Visit (INDEPENDENT_AMBULATORY_CARE_PROVIDER_SITE_OTHER): Payer: Medicaid Other | Admitting: Certified Nurse Midwife

## 2016-01-24 DIAGNOSIS — O0992 Supervision of high risk pregnancy, unspecified, second trimester: Secondary | ICD-10-CM

## 2016-01-24 DIAGNOSIS — O0990 Supervision of high risk pregnancy, unspecified, unspecified trimester: Secondary | ICD-10-CM
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
## 2016-02-01 ENCOUNTER — Telehealth: Payer: Self-pay | Admitting: *Deleted

## 2016-02-01 NOTE — Telephone Encounter (Signed)
Ok to retreat and schedule TOC 4-6 weeks; may need oral swab.  Thank you.  R.Kartier Bennison CNM

## 2016-02-01 NOTE — Telephone Encounter (Signed)
Patient states she thinks she was reinfected with trich because she had oral sex. 1:22 Patient has refrained from sexual activity- but she and her partner have been doing other activities. She forgot he had not been treated for trich and she has been having some itching on the outside and thinks she may have been exposed to the bacteria. She wants to know if she can get retreated.

## 2016-02-03 ENCOUNTER — Other Ambulatory Visit: Payer: Self-pay | Admitting: *Deleted

## 2016-02-03 DIAGNOSIS — A599 Trichomoniasis, unspecified: Secondary | ICD-10-CM

## 2016-02-03 MED ORDER — METRONIDAZOLE 500 MG PO TABS: 2000.0000 mg | ORAL_TABLET | Freq: Once | ORAL | 0 refills | Status: AC

## 2016-02-03 NOTE — Telephone Encounter (Signed)
Patient notified on VM.  

## 2016-02-09 ENCOUNTER — Encounter: Payer: Medicaid Other | Admitting: Certified Nurse Midwife

## 2016-02-09 ENCOUNTER — Ambulatory Visit (INDEPENDENT_AMBULATORY_CARE_PROVIDER_SITE_OTHER): Payer: Medicaid Other | Admitting: Certified Nurse Midwife

## 2016-02-09 VITALS — BP 116/75 | HR 70 | Wt 168.0 lb

## 2016-02-09 DIAGNOSIS — O0992 Supervision of high risk pregnancy, unspecified, second trimester: Secondary | ICD-10-CM

## 2016-02-09 DIAGNOSIS — O4402 Placenta previa specified as without hemorrhage, second trimester: Secondary | ICD-10-CM
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
## 2016-02-25 ENCOUNTER — Ambulatory Visit (HOSPITAL_COMMUNITY)
Admission: RE | Admit: 2016-02-25 | Discharge: 2016-02-25 | Disposition: A | Discharge status: Home / Self Care | Payer: Medicaid Other | Source: Ambulatory Visit | Attending: Certified Nurse Midwife | Admitting: Certified Nurse Midwife

## 2016-02-25 ENCOUNTER — Encounter (HOSPITAL_COMMUNITY): Payer: Self-pay

## 2016-02-25 ENCOUNTER — Other Ambulatory Visit (HOSPITAL_COMMUNITY): Payer: Self-pay | Admitting: Maternal and Fetal Medicine

## 2016-02-25 DIAGNOSIS — O99342 Other mental disorders complicating pregnancy, second trimester: Secondary | ICD-10-CM | POA: Insufficient documentation

## 2016-02-25 DIAGNOSIS — O4402 Placenta previa specified as without hemorrhage, second trimester: Secondary | ICD-10-CM | POA: Insufficient documentation

## 2016-02-25 DIAGNOSIS — Z3A26 26 weeks gestation of pregnancy: Secondary | ICD-10-CM | POA: Insufficient documentation

## 2016-02-25 NOTE — ED Notes (Signed)
Pt reports no bleeding today, had a gush of blood 2 days ago which only happened in the morning then stopped.  Good fetal movement reported.

## 2016-02-28 ENCOUNTER — Ambulatory Visit (INDEPENDENT_AMBULATORY_CARE_PROVIDER_SITE_OTHER): Payer: Medicaid Other | Admitting: Obstetrics and Gynecology

## 2016-02-28 ENCOUNTER — Other Ambulatory Visit (HOSPITAL_COMMUNITY): Payer: Self-pay | Admitting: *Deleted

## 2016-02-28 VITALS — BP 114/74 | HR 87 | Wt 169.4 lb

## 2016-02-28 DIAGNOSIS — M549 Dorsalgia, unspecified: Secondary | ICD-10-CM

## 2016-02-28 DIAGNOSIS — O9989 Other specified diseases and conditions complicating pregnancy, childbirth and the puerperium: Secondary | ICD-10-CM

## 2016-02-28 DIAGNOSIS — O0992 Supervision of high risk pregnancy, unspecified, second trimester: Secondary | ICD-10-CM

## 2016-02-28 DIAGNOSIS — O26892 Other specified pregnancy related conditions, second trimester: Secondary | ICD-10-CM

## 2016-02-28 DIAGNOSIS — O4440 Low lying placenta NOS or without hemorrhage, unspecified trimester: Secondary | ICD-10-CM

## 2016-02-28 DIAGNOSIS — O4402 Placenta previa specified as without hemorrhage, second trimester: Secondary | ICD-10-CM

## 2016-02-28 DIAGNOSIS — O99891 Other specified diseases and conditions complicating pregnancy: Secondary | ICD-10-CM

## 2016-02-28 LAB — POCT URINALYSIS DIPSTICK
Bilirubin, UA: NEGATIVE
Blood, UA: NEGATIVE
Glucose, UA: NEGATIVE
KETONES UA: NEGATIVE
LEUKOCYTES UA: NEGATIVE
NITRITE UA: NEGATIVE
PH UA: 7
PROTEIN UA: NEGATIVE
Spec Grav, UA: 1.01
UROBILINOGEN UA: NEGATIVE
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
## 2016-02-28 NOTE — Addendum Note (Signed)
Addended by: Elby BeckPAUL, Dajon Lazar F on: 02/28/2016 09:00 AM   Modules accepted: Orders

## 2016-02-28 NOTE — Progress Notes (Signed)
Patient states she had some bleeding Wednesday before the US. Patient reports she has back pain and some cramping.

## 2016-03-08 ENCOUNTER — Other Ambulatory Visit: Payer: Medicaid Other

## 2016-03-08 DIAGNOSIS — Z3490 Encounter for supervision of normal pregnancy, unspecified, unspecified trimester: Secondary | ICD-10-CM

## 2016-03-08 MED ORDER — METRONIDAZOLE 500 MG PO TABS: 2000.0000 mg | ORAL_TABLET | Freq: Once | ORAL | 0 refills | Status: DC
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
## 2016-03-09 LAB — GLUCOSE TOLERANCE, 2 HOURS W/ 1HR
GLUCOSE, 2 HOUR: 76 mg/dL (ref 65–152)
Glucose, 1 hour: 80 mg/dL (ref 65–179)
Glucose, Fasting: 79 mg/dL (ref 65–91)

## 2016-03-09 LAB — CBC
HEMATOCRIT: 36.1 % (ref 34.0–46.6)
HEMOGLOBIN: 12.4 g/dL (ref 11.1–15.9)
MCH: 31.2 pg (ref 26.6–33.0)
MCHC: 34.3 g/dL (ref 31.5–35.7)
MCV: 91 fL (ref 79–97)
Platelets: 176 10*3/uL (ref 150–379)
RBC: 3.98 x10E6/uL (ref 3.77–5.28)
RDW: 12.9 % (ref 12.3–15.4)
WBC: 12.3 10*3/uL — AB (ref 3.4–10.8)

## 2016-03-09 LAB — RPR: RPR: NONREACTIVE

## 2016-03-09 LAB — HIV ANTIBODY (ROUTINE TESTING W REFLEX): HIV Screen 4th Generation wRfx: NONREACTIVE

## 2016-03-15 ENCOUNTER — Ambulatory Visit (INDEPENDENT_AMBULATORY_CARE_PROVIDER_SITE_OTHER): Payer: Medicaid Other | Admitting: Obstetrics & Gynecology

## 2016-03-15 VITALS — BP 109/69 | HR 76 | Wt 175.8 lb

## 2016-03-15 DIAGNOSIS — O0992 Supervision of high risk pregnancy, unspecified, second trimester: Secondary | ICD-10-CM

## 2016-03-15 DIAGNOSIS — O4440 Low lying placenta NOS or without hemorrhage, unspecified trimester: Secondary | ICD-10-CM

## 2016-03-15 DIAGNOSIS — K640 First degree hemorrhoids: Secondary | ICD-10-CM

## 2016-03-15 DIAGNOSIS — K649 Unspecified hemorrhoids: Secondary | ICD-10-CM | POA: Insufficient documentation

## 2016-03-15 HISTORY — DX: Unspecified hemorrhoids: K64.9

## 2016-03-15 MED ORDER — HYDROCORTISONE ACETATE 25 MG RE SUPP: 25.0000 mg | Freq: Two times a day (BID) | RECTAL | 0 refills | Status: DC

## 2016-03-15 NOTE — Progress Notes (Signed)
Patient is having severe back pain- she also has bleeding her hemorrhoids.
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
## 2016-03-15 NOTE — Patient Instructions (Signed)
Third Trimester of Pregnancy The third trimester is from week 29 through week 40 (months 7 through 9). The third trimester is a time when the unborn baby (fetus) is growing rapidly. At the end of the ninth month, the fetus is about 20 inches in length and weighs 6-10 pounds. Body changes during your third trimester Your body goes through many changes during pregnancy. The changes vary from woman to woman. During the third trimester:  Your weight will continue to increase. You can expect to gain 25-35 pounds (11-16 kg) by the end of the pregnancy.  You may begin to get stretch marks on your hips, abdomen, and breasts.  You may urinate more often because the fetus is moving lower into your pelvis and pressing on your bladder.  You may develop or continue to have heartburn. This is caused by increased hormones that slow down muscles in the digestive tract.  You may develop or continue to have constipation because increased hormones slow digestion and cause the muscles that push waste through your intestines to relax.  You may develop hemorrhoids. These are swollen veins (varicose veins) in the rectum that can itch or be painful.  You may develop swollen, bulging veins (varicose veins) in your legs.  You may have increased body aches in the pelvis, back, or thighs. This is due to weight gain and increased hormones that are relaxing your joints.  You may have changes in your hair. These can include thickening of your hair, rapid growth, and changes in texture. Some women also have hair loss during or after pregnancy, or hair that feels dry or thin. Your hair will most likely return to normal after your baby is born.  Your breasts will continue to grow and they will continue to become tender. A yellow fluid (colostrum) may leak from your breasts. This is the first milk you are producing for your baby.  Your belly button may stick out.  You may notice more swelling in your hands, face, or  ankles.  You may have increased tingling or numbness in your hands, arms, and legs. The skin on your belly may also feel numb.  You may feel short of breath because of your expanding uterus.  You may have more problems sleeping. This can be caused by the size of your belly, increased need to urinate, and an increase in your body's metabolism.  You may notice the fetus "dropping," or moving lower in your abdomen.  You may have increased vaginal discharge.  Your cervix becomes thin and soft (effaced) near your due date. What to expect at prenatal visits You will have prenatal exams every 2 weeks until week 36. Then you will have weekly prenatal exams. During a routine prenatal visit:  You will be weighed to make sure you and the fetus are growing normally.  Your blood pressure will be taken.  Your abdomen will be measured to track your baby's growth.  The fetal heartbeat will be listened to.  Any test results from the previous visit will be discussed.  You may have a cervical check near your due date to see if you have effaced. At around 36 weeks, your health care provider will check your cervix. At the same time, your health care provider will also perform a test on the secretions of the vaginal tissue. This test is to determine if a type of bacteria, Group B streptococcus, is present. Your health care provider will explain this further. Your health care provider may ask you:    What your birth plan is.  How you are feeling.  If you are feeling the baby move.  If you have had any abnormal symptoms, such as leaking fluid, bleeding, severe headaches, or abdominal cramping.  If you are using any tobacco products, including cigarettes, chewing tobacco, and electronic cigarettes.  If you have any questions. Other tests or screenings that may be performed during your third trimester include:  Blood tests that check for low iron levels (anemia).  Fetal testing to check the health,  activity level, and growth of the fetus. Testing is done if you have certain medical conditions or if there are problems during the pregnancy.  Nonstress test (NST). This test checks the health of your baby to make sure there are no signs of problems, such as the baby not getting enough oxygen. During this test, a belt is placed around your belly. The baby is made to move, and its heart rate is monitored during movement. What is false labor? False labor is a condition in which you feel small, irregular tightenings of the muscles in the womb (contractions) that eventually go away. These are called Braxton Hicks contractions. Contractions may last for hours, days, or even weeks before true labor sets in. If contractions come at regular intervals, become more frequent, increase in intensity, or become painful, you should see your health care provider. What are the signs of labor?  Abdominal cramps.  Regular contractions that start at 10 minutes apart and become stronger and more frequent with time.  Contractions that start on the top of the uterus and spread down to the lower abdomen and back.  Increased pelvic pressure and dull back pain.  A watery or bloody mucus discharge that comes from the vagina.  Leaking of amniotic fluid. This is also known as your "water breaking." It could be a slow trickle or a gush. Let your doctor know if it has a color or strange odor. If you have any of these signs, call your health care provider right away, even if it is before your due date. Follow these instructions at home: Eating and drinking  Continue to eat regular, healthy meals.  Do not eat:  Raw meat or meat spreads.  Unpasteurized milk or cheese.  Unpasteurized juice.  Store-made salad.  Refrigerated smoked seafood.  Hot dogs or deli meat, unless they are piping hot.  More than 6 ounces of albacore tuna a week.  Shark, swordfish, king mackerel, or tile fish.  Store-made salads.  Raw  sprouts, such as mung bean or alfalfa sprouts.  Take prenatal vitamins as told by your health care provider.  Take 1000 mg of calcium daily as told by your health care provider.  If you develop constipation:  Take over-the-counter or prescription medicines.  Drink enough fluid to keep your urine clear or pale yellow.  Eat foods that are high in fiber, such as fresh fruits and vegetables, whole grains, and beans.  Limit foods that are high in fat and processed sugars, such as fried and sweet foods. Activity  Exercise only as directed by your health care provider. Healthy pregnant women should aim for 2 hours and 30 minutes of moderate exercise per week. If you experience any pain or discomfort while exercising, stop.  Avoid heavy lifting.  Do not exercise in extreme heat or humidity, or at high altitudes.  Wear low-heel, comfortable shoes.  Practice good posture.  Do not travel far distances unless it is absolutely necessary and only with the approval   of your health care provider.  Wear your seat belt at all times while in a car, on a bus, or on a plane.  Take frequent breaks and rest with your legs elevated if you have leg cramps or low back pain.  Do not use hot tubs, steam rooms, or saunas.  You may continue to have sex unless your health care provider tells you otherwise. Lifestyle  Do not use any products that contain nicotine or tobacco, such as cigarettes and e-cigarettes. If you need help quitting, ask your health care provider.  Do not drink alcohol.  Do not use any medicinal herbs or unprescribed drugs. These chemicals affect the formation and growth of the baby.  If you develop varicose veins:  Wear support pantyhose or compression stockings as told by your healthcare provider.  Elevate your feet for 15 minutes, 3-4 times a day.  Wear a supportive maternity bra to help with breast tenderness. General instructions  Take over-the-counter and prescription  medicines only as told by your health care provider. There are medicines that are either safe or unsafe to take during pregnancy.  Take warm sitz baths to soothe any pain or discomfort caused by hemorrhoids. Use hemorrhoid cream or witch hazel if your health care provider approves.  Avoid cat litter boxes and soil used by cats. These carry germs that can cause birth defects in the baby. If you have a cat, ask someone to clean the litter box for you.  To prepare for the arrival of your baby:  Take prenatal classes to understand, practice, and ask questions about the labor and delivery.  Make a trial run to the hospital.  Visit the hospital and tour the maternity area.  Arrange for maternity or paternity leave through employers.  Arrange for family and friends to take care of pets while you are in the hospital.  Purchase a rear-facing car seat and make sure you know how to install it in your car.  Pack your hospital bag.  Prepare the baby's nursery. Make sure to remove all pillows and stuffed animals from the baby's crib to prevent suffocation.  Visit your dentist if you have not gone during your pregnancy. Use a soft toothbrush to brush your teeth and be gentle when you floss.  Keep all prenatal follow-up visits as told by your health care provider. This is important. Contact a health care provider if:  You are unsure if you are in labor or if your water has broken.  You become dizzy.  You have mild pelvic cramps, pelvic pressure, or nagging pain in your abdominal area.  You have lower back pain.  You have persistent nausea, vomiting, or diarrhea.  You have an unusual or bad smelling vaginal discharge.  You have pain when you urinate. Get help right away if:  You have a fever.  You are leaking fluid from your vagina.  You have spotting or bleeding from your vagina.  You have severe abdominal pain or cramping.  You have rapid weight loss or weight gain.  You have  shortness of breath with chest pain.  You notice sudden or extreme swelling of your face, hands, ankles, feet, or legs.  Your baby makes fewer than 10 movements in 2 hours.  You have severe headaches that do not go away with medicine.  You have vision changes. Summary  The third trimester is from week 29 through week 40, months 7 through 9. The third trimester is a time when the unborn baby (fetus)   is growing rapidly.  During the third trimester, your discomfort may increase as you and your baby continue to gain weight. You may have abdominal, leg, and back pain, sleeping problems, and an increased need to urinate.  During the third trimester your breasts will keep growing and they will continue to become tender. A yellow fluid (colostrum) may leak from your breasts. This is the first milk you are producing for your baby.  False labor is a condition in which you feel small, irregular tightenings of the muscles in the womb (contractions) that eventually go away. These are called Braxton Hicks contractions. Contractions may last for hours, days, or even weeks before true labor sets in.  Signs of labor can include: abdominal cramps; regular contractions that start at 10 minutes apart and become stronger and more frequent with time; watery or bloody mucus discharge that comes from the vagina; increased pelvic pressure and dull back pain; and leaking of amniotic fluid. This information is not intended to replace advice given to you by your health care provider. Make sure you discuss any questions you have with your health care provider. Document Released: 03/07/2001 Document Revised: 08/19/2015 Document Reviewed: 05/14/2012 Elsevier Interactive Patient Education  2017 [employer]  

## 2016-03-21 ENCOUNTER — Encounter (HOSPITAL_COMMUNITY): Payer: Self-pay | Admitting: *Deleted

## 2016-03-21 ENCOUNTER — Inpatient Hospital Stay (HOSPITAL_COMMUNITY)
Admission: AD | Admit: 2016-03-21 | Discharge: 2016-03-21 | Disposition: A | Discharge status: Home / Self Care | Payer: Medicaid Other | Source: Ambulatory Visit | Attending: Family Medicine | Admitting: Family Medicine

## 2016-03-21 DIAGNOSIS — Z3A30 30 weeks gestation of pregnancy: Secondary | ICD-10-CM | POA: Diagnosis not present

## 2016-03-21 DIAGNOSIS — Z87891 Personal history of nicotine dependence: Secondary | ICD-10-CM | POA: Diagnosis not present

## 2016-03-21 DIAGNOSIS — R197 Diarrhea, unspecified: Secondary | ICD-10-CM | POA: Insufficient documentation

## 2016-03-21 DIAGNOSIS — O4453 Low lying placenta with hemorrhage, third trimester: Secondary | ICD-10-CM

## 2016-03-21 DIAGNOSIS — O4440 Low lying placenta NOS or without hemorrhage, unspecified trimester: Secondary | ICD-10-CM

## 2016-03-21 DIAGNOSIS — Z79899 Other long term (current) drug therapy: Secondary | ICD-10-CM | POA: Diagnosis not present

## 2016-03-21 DIAGNOSIS — M549 Dorsalgia, unspecified: Secondary | ICD-10-CM | POA: Insufficient documentation

## 2016-03-21 DIAGNOSIS — O26853 Spotting complicating pregnancy, third trimester: Secondary | ICD-10-CM

## 2016-03-21 DIAGNOSIS — Z825 Family history of asthma and other chronic lower respiratory diseases: Secondary | ICD-10-CM | POA: Diagnosis not present

## 2016-03-21 DIAGNOSIS — R110 Nausea: Secondary | ICD-10-CM | POA: Insufficient documentation

## 2016-03-21 DIAGNOSIS — Z8249 Family history of ischemic heart disease and other diseases of the circulatory system: Secondary | ICD-10-CM | POA: Insufficient documentation

## 2016-03-21 DIAGNOSIS — O26893 Other specified pregnancy related conditions, third trimester: Secondary | ICD-10-CM | POA: Diagnosis not present

## 2016-03-21 DIAGNOSIS — O4703 False labor before 37 completed weeks of gestation, third trimester: Secondary | ICD-10-CM | POA: Diagnosis not present

## 2016-03-21 DIAGNOSIS — Z833 Family history of diabetes mellitus: Secondary | ICD-10-CM | POA: Insufficient documentation

## 2016-03-21 DIAGNOSIS — R198 Other specified symptoms and signs involving the digestive system and abdomen: Secondary | ICD-10-CM

## 2016-03-21 DIAGNOSIS — R109 Unspecified abdominal pain: Secondary | ICD-10-CM | POA: Diagnosis present

## 2016-03-21 LAB — URINALYSIS, ROUTINE W REFLEX MICROSCOPIC
BACTERIA UA: NONE SEEN
Bilirubin Urine: NEGATIVE
Glucose, UA: NEGATIVE mg/dL
HGB URINE DIPSTICK: NEGATIVE
Ketones, ur: 5 mg/dL — AB
NITRITE: NEGATIVE
PROTEIN: NEGATIVE mg/dL
SPECIFIC GRAVITY, URINE: 1.025 (ref 1.005–1.030)
pH: 5 (ref 5.0–8.0)

## 2016-03-21 LAB — FETAL FIBRONECTIN: FETAL FIBRONECTIN: NEGATIVE

## 2016-03-21 MED ORDER — PROMETHAZINE HCL 12.5 MG PO TABS: 12.5000 mg | ORAL_TABLET | Freq: Four times a day (QID) | ORAL | 0 refills | Status: DC | PRN

## 2016-03-21 MED ORDER — LACTATED RINGERS IV BOLUS (SEPSIS): 1000.0000 mL | Freq: Once | INTRAVENOUS | Status: DC
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
## 2016-03-21 NOTE — Discharge Instructions (Signed)
Bland Diet Introduction A bland diet consists of foods that do not have a lot of fat or fiber. Foods without fat or fiber are easier for the body to digest. They are also less likely to irritate your mouth, throat, stomach, and other parts of your gastrointestinal tract. A bland diet is sometimes called a BRAT diet. What is my plan? Your health care provider or dietitian may recommend specific changes to your diet to prevent and treat your symptoms, such as:  Eating small meals often.  Cooking food until it is soft enough to chew easily.  Chewing your food well.  Drinking fluids slowly.  Not eating foods that are very spicy, sour, or fatty.  Not eating citrus fruits, such as oranges and grapefruit. What do I need to know about this diet?  Eat a variety of foods from the bland diet food list.  Do not follow a bland diet longer than you have to.  Ask your health care provider whether you should take vitamins. What foods can I eat? Grains  Hot cereals, such as cream of wheat. Bread, crackers, or tortillas made from refined white flour. Rice. Vegetables  Canned or cooked vegetables. Mashed or boiled potatoes. Fruits  Bananas. Applesauce. Other types of cooked or canned fruit with the skin and seeds removed, such as canned peaches or pears. Meats and Other Protein Sources  Scrambled eggs. Creamy peanut butter or other nut butters. Lean, well-cooked meats, such as chicken or fish. Tofu. Soups or broths. Dairy  Low-fat dairy products, such as milk, cottage cheese, or yogurt. Beverages  Water. Herbal tea. Apple juice. Sweets and Desserts  Pudding. Custard. Fruit gelatin. Ice cream. Fats and Oils  Mild salad dressings. Canola or olive oil. The items listed above may not be a complete list of allowed foods or beverages. Contact your dietitian for more options.  What foods are not recommended? Foods and ingredients that are often not recommended include:  Spicy foods, such as hot  sauce or salsa.  Fried foods.  Sour foods, such as pickled or fermented foods.  Raw vegetables or fruits, especially citrus or berries.  Caffeinated drinks.  Alcohol.  Strongly flavored seasonings or condiments. The items listed above may not be a complete list of foods and beverages that are not allowed. Contact your dietitian for more information.  This information is not intended to replace advice given to you by your health care provider. Make sure you discuss any questions you have with your health care provider. Document Released: 07/05/2015 Document Revised: 08/19/2015 Document Reviewed: 03/25/2014  2017 Elsevier  

## 2016-03-21 NOTE — MAU Note (Addendum)
Pt C/O lower back pain/burning x 2 days, pain become worse at work tonight, also started having lower abd cramping.  Had mucus discharge this morning.  Denies bleeding.  Decreased FM.  Hx of placenta previa, thinks it may be low lying now.  Diarrhea x 3 today, no vomiting.

## 2016-03-27 NOTE — L&D Delivery Note (Addendum)
Delivery Note At 1540 on 05/14/2016, a viable female was delivered via SVD (Presentation: vertex OA  ).  APGAR: 8,9 ; weight TBD.   Placenta status: spontaneous, intact.  Cord: 2 vessel with the following complications: none.  Anesthesia: Epidural Episiotomy: None Lacerations: None Suture Repair: N/A Est. Blood Loss (mL): 100  Mom to postpartum.  Baby to Couplet care / Skin to Skin.  Wendee Beaversavid J McMullen, DO, PGY-1 05/14/2016, 3:57 PM  Attestation of Attending Supervision of Advanced Practitioner (CNM/NP): Evaluation and management procedures were performed by the Advanced Practitioner under my supervision and collaboration.  I have reviewed the Advanced Practitioner's note and chart, and I agree with the management and plan.  Elver Stadler 06/22/2016 10:44 AM

## 2016-04-04 ENCOUNTER — Ambulatory Visit (INDEPENDENT_AMBULATORY_CARE_PROVIDER_SITE_OTHER): Payer: Medicaid Other | Admitting: Obstetrics and Gynecology

## 2016-04-04 VITALS — BP 106/71 | HR 84 | Wt 177.0 lb

## 2016-04-04 DIAGNOSIS — O0992 Supervision of high risk pregnancy, unspecified, second trimester: Secondary | ICD-10-CM

## 2016-04-04 DIAGNOSIS — Z23 Encounter for immunization: Secondary | ICD-10-CM

## 2016-04-04 DIAGNOSIS — O4440 Low lying placenta NOS or without hemorrhage, unspecified trimester: Secondary | ICD-10-CM

## 2016-04-04 NOTE — Addendum Note (Signed)
Addended by: Natale MilchSTALLING, Jadda Hunsucker D on: 04/04/2016 10:15 AM   Modules accepted: Orders

## 2016-04-04 NOTE — Progress Notes (Signed)
Patient complains of trouble sleeping, reports good fetal movement.
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
## 2016-04-07 ENCOUNTER — Other Ambulatory Visit (HOSPITAL_COMMUNITY): Payer: Self-pay | Admitting: Maternal and Fetal Medicine

## 2016-04-07 ENCOUNTER — Ambulatory Visit (HOSPITAL_COMMUNITY)
Admission: RE | Admit: 2016-04-07 | Discharge: 2016-04-07 | Disposition: A | Discharge status: Home / Self Care | Payer: Medicaid Other | Source: Ambulatory Visit | Attending: Certified Nurse Midwife | Admitting: Certified Nurse Midwife

## 2016-04-07 ENCOUNTER — Encounter (HOSPITAL_COMMUNITY): Payer: Self-pay

## 2016-04-07 DIAGNOSIS — Q270 Congenital absence and hypoplasia of umbilical artery: Secondary | ICD-10-CM

## 2016-04-07 DIAGNOSIS — O4403 Placenta previa specified as without hemorrhage, third trimester: Secondary | ICD-10-CM | POA: Diagnosis not present

## 2016-04-07 DIAGNOSIS — O4440 Low lying placenta NOS or without hemorrhage, unspecified trimester: Secondary | ICD-10-CM

## 2016-04-07 DIAGNOSIS — Z3A32 32 weeks gestation of pregnancy: Secondary | ICD-10-CM

## 2016-04-07 DIAGNOSIS — O99343 Other mental disorders complicating pregnancy, third trimester: Secondary | ICD-10-CM | POA: Insufficient documentation

## 2016-04-09 ENCOUNTER — Other Ambulatory Visit: Payer: Self-pay | Admitting: Obstetrics

## 2016-04-11 ENCOUNTER — Other Ambulatory Visit (HOSPITAL_COMMUNITY): Payer: Self-pay | Admitting: *Deleted

## 2016-04-11 DIAGNOSIS — O09899 Supervision of other high risk pregnancies, unspecified trimester: Secondary | ICD-10-CM

## 2016-04-18 ENCOUNTER — Ambulatory Visit (INDEPENDENT_AMBULATORY_CARE_PROVIDER_SITE_OTHER): Payer: Medicaid Other | Admitting: Obstetrics and Gynecology

## 2016-04-18 VITALS — BP 105/70 | HR 74 | Wt 180.0 lb

## 2016-04-18 DIAGNOSIS — O0993 Supervision of high risk pregnancy, unspecified, third trimester: Secondary | ICD-10-CM

## 2016-04-18 DIAGNOSIS — Q270 Congenital absence and hypoplasia of umbilical artery: Secondary | ICD-10-CM

## 2016-04-18 DIAGNOSIS — O4443 Low lying placenta NOS or without hemorrhage, third trimester: Secondary | ICD-10-CM

## 2016-04-18 DIAGNOSIS — O4440 Low lying placenta NOS or without hemorrhage, unspecified trimester: Secondary | ICD-10-CM
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
## 2016-04-18 NOTE — Patient Instructions (Signed)
Third Trimester of Pregnancy The third trimester is from week 29 through week 40 (months 7 through 9). The third trimester is a time when the unborn baby (fetus) is growing rapidly. At the end of the ninth month, the fetus is about 20 inches in length and weighs 6-10 pounds. Body changes during your third trimester Your body goes through many changes during pregnancy. The changes vary from woman to woman. During the third trimester:  Your weight will continue to increase. You can expect to gain 25-35 pounds (11-16 kg) by the end of the pregnancy.  You may begin to get stretch marks on your hips, abdomen, and breasts.  You may urinate more often because the fetus is moving lower into your pelvis and pressing on your bladder.  You may develop or continue to have heartburn. This is caused by increased hormones that slow down muscles in the digestive tract.  You may develop or continue to have constipation because increased hormones slow digestion and cause the muscles that push waste through your intestines to relax.  You may develop hemorrhoids. These are swollen veins (varicose veins) in the rectum that can itch or be painful.  You may develop swollen, bulging veins (varicose veins) in your legs.  You may have increased body aches in the pelvis, back, or thighs. This is due to weight gain and increased hormones that are relaxing your joints.  You may have changes in your hair. These can include thickening of your hair, rapid growth, and changes in texture. Some women also have hair loss during or after pregnancy, or hair that feels dry or thin. Your hair will most likely return to normal after your baby is born.  Your breasts will continue to grow and they will continue to become tender. A yellow fluid (colostrum) may leak from your breasts. This is the first milk you are producing for your baby.  Your belly button may stick out.  You may notice more swelling in your hands, face, or  ankles.  You may have increased tingling or numbness in your hands, arms, and legs. The skin on your belly may also feel numb.  You may feel short of breath because of your expanding uterus.  You may have more problems sleeping. This can be caused by the size of your belly, increased need to urinate, and an increase in your body's metabolism.  You may notice the fetus "dropping," or moving lower in your abdomen.  You may have increased vaginal discharge.  Your cervix becomes thin and soft (effaced) near your due date. What to expect at prenatal visits You will have prenatal exams every 2 weeks until week 36. Then you will have weekly prenatal exams. During a routine prenatal visit:  You will be weighed to make sure you and the fetus are growing normally.  Your blood pressure will be taken.  Your abdomen will be measured to track your baby's growth.  The fetal heartbeat will be listened to.  Any test results from the previous visit will be discussed.  You may have a cervical check near your due date to see if you have effaced. At around 36 weeks, your health care provider will check your cervix. At the same time, your health care provider will also perform a test on the secretions of the vaginal tissue. This test is to determine if a type of bacteria, Group B streptococcus, is present. Your health care provider will explain this further. Your health care provider may ask you:    What your birth plan is.  How you are feeling.  If you are feeling the baby move.  If you have had any abnormal symptoms, such as leaking fluid, bleeding, severe headaches, or abdominal cramping.  If you are using any tobacco products, including cigarettes, chewing tobacco, and electronic cigarettes.  If you have any questions. Other tests or screenings that may be performed during your third trimester include:  Blood tests that check for low iron levels (anemia).  Fetal testing to check the health,  activity level, and growth of the fetus. Testing is done if you have certain medical conditions or if there are problems during the pregnancy.  Nonstress test (NST). This test checks the health of your baby to make sure there are no signs of problems, such as the baby not getting enough oxygen. During this test, a belt is placed around your belly. The baby is made to move, and its heart rate is monitored during movement. What is false labor? False labor is a condition in which you feel small, irregular tightenings of the muscles in the womb (contractions) that eventually go away. These are called Braxton Hicks contractions. Contractions may last for hours, days, or even weeks before true labor sets in. If contractions come at regular intervals, become more frequent, increase in intensity, or become painful, you should see your health care provider. What are the signs of labor?  Abdominal cramps.  Regular contractions that start at 10 minutes apart and become stronger and more frequent with time.  Contractions that start on the top of the uterus and spread down to the lower abdomen and back.  Increased pelvic pressure and dull back pain.  A watery or bloody mucus discharge that comes from the vagina.  Leaking of amniotic fluid. This is also known as your "water breaking." It could be a slow trickle or a gush. Let your doctor know if it has a color or strange odor. If you have any of these signs, call your health care provider right away, even if it is before your due date. Follow these instructions at home: Eating and drinking  Continue to eat regular, healthy meals.  Do not eat:  Raw meat or meat spreads.  Unpasteurized milk or cheese.  Unpasteurized juice.  Store-made salad.  Refrigerated smoked seafood.  Hot dogs or deli meat, unless they are piping hot.  More than 6 ounces of albacore tuna a week.  Shark, swordfish, king mackerel, or tile fish.  Store-made salads.  Raw  sprouts, such as mung bean or alfalfa sprouts.  Take prenatal vitamins as told by your health care provider.  Take 1000 mg of calcium daily as told by your health care provider.  If you develop constipation:  Take over-the-counter or prescription medicines.  Drink enough fluid to keep your urine clear or pale yellow.  Eat foods that are high in fiber, such as fresh fruits and vegetables, whole grains, and beans.  Limit foods that are high in fat and processed sugars, such as fried and sweet foods. Activity  Exercise only as directed by your health care provider. Healthy pregnant women should aim for 2 hours and 30 minutes of moderate exercise per week. If you experience any pain or discomfort while exercising, stop.  Avoid heavy lifting.  Do not exercise in extreme heat or humidity, or at high altitudes.  Wear low-heel, comfortable shoes.  Practice good posture.  Do not travel far distances unless it is absolutely necessary and only with the approval   of your health care provider.  Wear your seat belt at all times while in a car, on a bus, or on a plane.  Take frequent breaks and rest with your legs elevated if you have leg cramps or low back pain.  Do not use hot tubs, steam rooms, or saunas.  You may continue to have sex unless your health care provider tells you otherwise. Lifestyle  Do not use any products that contain nicotine or tobacco, such as cigarettes and e-cigarettes. If you need help quitting, ask your health care provider.  Do not drink alcohol.  Do not use any medicinal herbs or unprescribed drugs. These chemicals affect the formation and growth of the baby.  If you develop varicose veins:  Wear support pantyhose or compression stockings as told by your healthcare provider.  Elevate your feet for 15 minutes, 3-4 times a day.  Wear a supportive maternity bra to help with breast tenderness. General instructions  Take over-the-counter and prescription  medicines only as told by your health care provider. There are medicines that are either safe or unsafe to take during pregnancy.  Take warm sitz baths to soothe any pain or discomfort caused by hemorrhoids. Use hemorrhoid cream or witch hazel if your health care provider approves.  Avoid cat litter boxes and soil used by cats. These carry germs that can cause birth defects in the baby. If you have a cat, ask someone to clean the litter box for you.  To prepare for the arrival of your baby:  Take prenatal classes to understand, practice, and ask questions about the labor and delivery.  Make a trial run to the hospital.  Visit the hospital and tour the maternity area.  Arrange for maternity or paternity leave through employers.  Arrange for family and friends to take care of pets while you are in the hospital.  Purchase a rear-facing car seat and make sure you know how to install it in your car.  Pack your hospital bag.  Prepare the baby's nursery. Make sure to remove all pillows and stuffed animals from the baby's crib to prevent suffocation.  Visit your dentist if you have not gone during your pregnancy. Use a soft toothbrush to brush your teeth and be gentle when you floss.  Keep all prenatal follow-up visits as told by your health care provider. This is important. Contact a health care provider if:  You are unsure if you are in labor or if your water has broken.  You become dizzy.  You have mild pelvic cramps, pelvic pressure, or nagging pain in your abdominal area.  You have lower back pain.  You have persistent nausea, vomiting, or diarrhea.  You have an unusual or bad smelling vaginal discharge.  You have pain when you urinate. Get help right away if:  You have a fever.  You are leaking fluid from your vagina.  You have spotting or bleeding from your vagina.  You have severe abdominal pain or cramping.  You have rapid weight loss or weight gain.  You have  shortness of breath with chest pain.  You notice sudden or extreme swelling of your face, hands, ankles, feet, or legs.  Your baby makes fewer than 10 movements in 2 hours.  You have severe headaches that do not go away with medicine.  You have vision changes. Summary  The third trimester is from week 29 through week 40, months 7 through 9. The third trimester is a time when the unborn baby (fetus)   is growing rapidly.  During the third trimester, your discomfort may increase as you and your baby continue to gain weight. You may have abdominal, leg, and back pain, sleeping problems, and an increased need to urinate.  During the third trimester your breasts will keep growing and they will continue to become tender. A yellow fluid (colostrum) may leak from your breasts. This is the first milk you are producing for your baby.  False labor is a condition in which you feel small, irregular tightenings of the muscles in the womb (contractions) that eventually go away. These are called Braxton Hicks contractions. Contractions may last for hours, days, or even weeks before true labor sets in.  Signs of labor can include: abdominal cramps; regular contractions that start at 10 minutes apart and become stronger and more frequent with time; watery or bloody mucus discharge that comes from the vagina; increased pelvic pressure and dull back pain; and leaking of amniotic fluid. This information is not intended to replace advice given to you by your health care provider. Make sure you discuss any questions you have with your health care provider. Document Released: 03/07/2001 Document Revised: 08/19/2015 Document Reviewed: 05/14/2012 Elsevier Interactive Patient Education  2017 [employer]  

## 2016-05-03 ENCOUNTER — Other Ambulatory Visit (HOSPITAL_COMMUNITY)
Admission: RE | Admit: 2016-05-03 | Discharge: 2016-05-03 | Disposition: A | Discharge status: Home / Self Care | Payer: Medicaid Other | Source: Ambulatory Visit | Attending: Obstetrics & Gynecology | Admitting: Obstetrics & Gynecology

## 2016-05-03 ENCOUNTER — Ambulatory Visit (INDEPENDENT_AMBULATORY_CARE_PROVIDER_SITE_OTHER): Payer: Medicaid Other | Admitting: Obstetrics & Gynecology

## 2016-05-03 DIAGNOSIS — Z3A36 36 weeks gestation of pregnancy: Secondary | ICD-10-CM | POA: Diagnosis not present

## 2016-05-03 DIAGNOSIS — O0993 Supervision of high risk pregnancy, unspecified, third trimester: Secondary | ICD-10-CM

## 2016-05-03 LAB — OB RESULTS CONSOLE GBS: STREP GROUP B AG: NEGATIVE

## 2016-05-03 NOTE — Progress Notes (Signed)
Patient would like Cervix checked
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
## 2016-05-04 LAB — GC/CHLAMYDIA PROBE AMP (~~LOC~~) NOT AT ARMC
CHLAMYDIA, DNA PROBE: NEGATIVE
NEISSERIA GONORRHEA: NEGATIVE

## 2016-05-04 LAB — CERVICOVAGINAL ANCILLARY ONLY
Bacterial vaginitis: NEGATIVE
CANDIDA VAGINITIS: NEGATIVE
TRICH (WINDOWPATH): NEGATIVE

## 2016-05-05 ENCOUNTER — Ambulatory Visit (HOSPITAL_COMMUNITY)
Admission: RE | Admit: 2016-05-05 | Discharge: 2016-05-05 | Disposition: A | Discharge status: Home / Self Care | Payer: Medicaid Other | Source: Ambulatory Visit | Attending: Certified Nurse Midwife | Admitting: Certified Nurse Midwife

## 2016-05-05 ENCOUNTER — Encounter (HOSPITAL_COMMUNITY): Payer: Self-pay

## 2016-05-05 DIAGNOSIS — O09899 Supervision of other high risk pregnancies, unspecified trimester: Secondary | ICD-10-CM

## 2016-05-05 DIAGNOSIS — Z3A36 36 weeks gestation of pregnancy: Secondary | ICD-10-CM | POA: Insufficient documentation

## 2016-05-05 DIAGNOSIS — O99343 Other mental disorders complicating pregnancy, third trimester: Secondary | ICD-10-CM | POA: Diagnosis not present

## 2016-05-05 LAB — STREP GP B NAA: Strep Gp B NAA: NEGATIVE

## 2016-05-05 NOTE — Addendum Note (Signed)
Encounter addended by: Raoul PitchLora Mae Veasna Santibanez on: 05/05/2016  9:13 AM<BR>    Actions taken: Imaging Exam ended

## 2016-05-08 ENCOUNTER — Encounter (HOSPITAL_COMMUNITY): Payer: Self-pay | Admitting: *Deleted

## 2016-05-08 ENCOUNTER — Inpatient Hospital Stay (HOSPITAL_COMMUNITY)
Admission: AD | Admit: 2016-05-08 | Discharge: 2016-05-08 | Disposition: A | Discharge status: Home / Self Care | Payer: Medicaid Other | Source: Ambulatory Visit | Attending: Obstetrics and Gynecology | Admitting: Obstetrics and Gynecology

## 2016-05-08 DIAGNOSIS — J00 Acute nasopharyngitis [common cold]: Secondary | ICD-10-CM | POA: Insufficient documentation

## 2016-05-08 DIAGNOSIS — Z3A37 37 weeks gestation of pregnancy: Secondary | ICD-10-CM | POA: Diagnosis not present

## 2016-05-08 DIAGNOSIS — Z3A36 36 weeks gestation of pregnancy: Secondary | ICD-10-CM | POA: Diagnosis not present

## 2016-05-08 DIAGNOSIS — Z87891 Personal history of nicotine dependence: Secondary | ICD-10-CM | POA: Insufficient documentation

## 2016-05-08 DIAGNOSIS — O99513 Diseases of the respiratory system complicating pregnancy, third trimester: Secondary | ICD-10-CM | POA: Insufficient documentation

## 2016-05-08 DIAGNOSIS — J069 Acute upper respiratory infection, unspecified: Secondary | ICD-10-CM | POA: Diagnosis present

## 2016-05-08 HISTORY — DX: Unspecified abnormal cytological findings in specimens from vagina: R87.629

## 2016-05-08 HISTORY — DX: Unspecified infectious disease: B99.9

## 2016-05-08 HISTORY — DX: Trichomoniasis, unspecified: A59.9

## 2016-05-08 LAB — URINALYSIS, ROUTINE W REFLEX MICROSCOPIC
Bilirubin Urine: NEGATIVE
GLUCOSE, UA: NEGATIVE mg/dL
Hgb urine dipstick: NEGATIVE
KETONES UR: NEGATIVE mg/dL
Nitrite: NEGATIVE
PROTEIN: NEGATIVE mg/dL
Specific Gravity, Urine: 1.02 (ref 1.005–1.030)
pH: 6 (ref 5.0–8.0)

## 2016-05-08 MED ORDER — ACETAMINOPHEN 325 MG PO TABS
650.0000 mg | ORAL_TABLET | Freq: Four times a day (QID) | ORAL | Status: DC | PRN
  Administered 2016-05-08: 650 mg via ORAL
  Filled 2016-05-08: qty 2

## 2016-05-08 NOTE — Discharge Instructions (Signed)
Upper Respiratory Infection, Adult Most upper respiratory infections (URIs) are a viral infection of the air passages leading to the lungs. A URI affects the nose, throat, and upper air passages. The most common type of URI is nasopharyngitis and is typically referred to as "the common cold." URIs run their course and usually go away on their own. Most of the time, a URI does not require medical attention, but sometimes a bacterial infection in the upper airways can follow a viral infection. This is called a secondary infection. Sinus and middle ear infections are common types of secondary upper respiratory infections. Bacterial pneumonia can also complicate a URI. A URI can worsen asthma and chronic obstructive pulmonary disease (COPD). Sometimes, these complications can require emergency medical care and may be life threatening. What are the causes? Almost all URIs are caused by viruses. A virus is a type of germ and can spread from one person to another. What increases the risk? You may be at risk for a URI if:  You smoke.  You have chronic heart or lung disease.  You have a weakened defense (immune) system.  You are very young or very old.  You have nasal allergies or asthma.  You work in crowded or poorly ventilated areas.  You work in health care facilities or schools.  What are the signs or symptoms? Symptoms typically develop 2-3 days after you come in contact with a cold virus. Most viral URIs last 7-10 days. However, viral URIs from the influenza virus (flu virus) can last 14-18 days and are typically more severe. Symptoms may include:  Runny or stuffy (congested) nose.  Sneezing.  Cough.  Sore throat.  Headache.  Fatigue.  Fever.  Loss of appetite.  Pain in your forehead, behind your eyes, and over your cheekbones (sinus pain).  Muscle aches.  How is this diagnosed? Your health care provider may diagnose a URI by:  Physical exam.  Tests to check that your  symptoms are not due to another condition such as: ? Strep throat. ? Sinusitis. ? Pneumonia. ? Asthma.  How is this treated? A URI goes away on its own with time. It cannot be cured with medicines, but medicines may be prescribed or recommended to relieve symptoms. Medicines may help:  Reduce your fever.  Reduce your cough.  Relieve nasal congestion.  Follow these instructions at home:  Take medicines only as directed by your health care provider.  Gargle warm saltwater or take cough drops to comfort your throat as directed by your health care provider.  Use a warm mist humidifier or inhale steam from a shower to increase air moisture. This may make it easier to breathe.  Drink enough fluid to keep your urine clear or pale yellow.  Eat soups and other clear broths and maintain good nutrition.  Rest as needed.  Return to work when your temperature has returned to normal or as your health care provider advises. You may need to stay home longer to avoid infecting others. You can also use a face mask and careful hand washing to prevent spread of the virus.  Increase the usage of your inhaler if you have asthma.  Do not use any tobacco products, including cigarettes, chewing tobacco, or electronic cigarettes. If you need help quitting, ask your health care provider. How is this prevented? The best way to protect yourself from getting a cold is to practice good hygiene.  Avoid oral or hand contact with people with cold symptoms.  Wash your   hands often if contact occurs.  There is no clear evidence that vitamin C, vitamin E, echinacea, or exercise reduces the chance of developing a cold. However, it is always recommended to get plenty of rest, exercise, and practice good nutrition. Contact a health care provider if:  You are getting worse rather than better.  Your symptoms are not controlled by medicine.  You have chills.  You have worsening shortness of breath.  You have  brown or red mucus.  You have yellow or brown nasal discharge.  You have pain in your face, especially when you bend forward.  You have a fever.  You have swollen neck glands.  You have pain while swallowing.  You have white areas in the back of your throat. Get help right away if:  You have severe or persistent: ? Headache. ? Ear pain. ? Sinus pain. ? Chest pain.  You have chronic lung disease and any of the following: ? Wheezing. ? Prolonged cough. ? Coughing up blood. ? A change in your usual mucus.  You have a stiff neck.  You have changes in your: ? Vision. ? Hearing. ? Thinking. ? Mood. This information is not intended to replace advice given to you by your health care provider. Make sure you discuss any questions you have with your health care provider. Document Released: 09/06/2000 Document Revised: 11/14/2015 Document Reviewed: 06/18/2013 Elsevier Interactive Patient Education  2017 [employer]vier Inc[employer]  
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
## 2016-05-14 ENCOUNTER — Inpatient Hospital Stay (HOSPITAL_COMMUNITY): Payer: Medicaid Other | Admitting: Anesthesiology

## 2016-05-14 ENCOUNTER — Inpatient Hospital Stay (HOSPITAL_COMMUNITY)
Admission: AD | Admit: 2016-05-14 | Discharge: 2016-05-15 | DRG: 775 | Disposition: A | Discharge status: Home / Self Care | Payer: Medicaid Other | Source: Ambulatory Visit | Attending: Obstetrics & Gynecology | Admitting: Obstetrics & Gynecology

## 2016-05-14 ENCOUNTER — Encounter (HOSPITAL_COMMUNITY): Payer: Self-pay | Admitting: *Deleted

## 2016-05-14 DIAGNOSIS — Z87891 Personal history of nicotine dependence: Secondary | ICD-10-CM

## 2016-05-14 DIAGNOSIS — O4202 Full-term premature rupture of membranes, onset of labor within 24 hours of rupture: Principal | ICD-10-CM | POA: Diagnosis present

## 2016-05-14 DIAGNOSIS — Z8249 Family history of ischemic heart disease and other diseases of the circulatory system: Secondary | ICD-10-CM

## 2016-05-14 DIAGNOSIS — Z833 Family history of diabetes mellitus: Secondary | ICD-10-CM | POA: Diagnosis not present

## 2016-05-14 DIAGNOSIS — Z3A37 37 weeks gestation of pregnancy: Secondary | ICD-10-CM

## 2016-05-14 DIAGNOSIS — Z823 Family history of stroke: Secondary | ICD-10-CM

## 2016-05-14 LAB — TYPE AND SCREEN
ABO/RH(D): A POS
Antibody Screen: NEGATIVE

## 2016-05-14 LAB — CBC
HEMATOCRIT: 36.9 % (ref 36.0–46.0)
HEMOGLOBIN: 12.4 g/dL (ref 12.0–15.0)
MCH: 29.7 pg (ref 26.0–34.0)
MCHC: 33.6 g/dL (ref 30.0–36.0)
MCV: 88.5 fL (ref 78.0–100.0)
Platelets: 194 10*3/uL (ref 150–400)
RBC: 4.17 MIL/uL (ref 3.87–5.11)
RDW: 13.4 % (ref 11.5–15.5)
WBC: 11.1 10*3/uL — AB (ref 4.0–10.5)

## 2016-05-14 LAB — POCT FERN TEST: POCT Fern Test: POSITIVE

## 2016-05-14 MED ORDER — SENNOSIDES-DOCUSATE SODIUM 8.6-50 MG PO TABS
2.0000 | ORAL_TABLET | ORAL | Status: DC
  Administered 2016-05-14: 2 via ORAL
  Filled 2016-05-14: qty 2

## 2016-05-14 MED ORDER — ALBUTEROL SULFATE (2.5 MG/3ML) 0.083% IN NEBU: 3.0000 mL | INHALATION_SOLUTION | Freq: Four times a day (QID) | RESPIRATORY_TRACT | Status: DC | PRN

## 2016-05-14 MED ORDER — ONDANSETRON HCL 4 MG/2ML IJ SOLN
4.0000 mg | Freq: Four times a day (QID) | INTRAMUSCULAR | Status: DC | PRN
  Administered 2016-05-14: 4 mg via INTRAVENOUS
  Filled 2016-05-14: qty 2

## 2016-05-14 MED ORDER — OXYTOCIN 40 UNITS IN LACTATED RINGERS INFUSION - SIMPLE MED: 2.5000 [IU]/h | INTRAVENOUS | Status: DC

## 2016-05-14 MED ORDER — EPHEDRINE 5 MG/ML INJ
10.0000 mg | INTRAVENOUS | Status: DC | PRN
  Filled 2016-05-14: qty 4

## 2016-05-14 MED ORDER — PHENYLEPHRINE 40 MCG/ML (10ML) SYRINGE FOR IV PUSH (FOR BLOOD PRESSURE SUPPORT)
80.0000 ug | PREFILLED_SYRINGE | INTRAVENOUS | Status: DC | PRN
  Filled 2016-05-14: qty 5

## 2016-05-14 MED ORDER — ACETAMINOPHEN 325 MG PO TABS: 650.0000 mg | ORAL_TABLET | ORAL | Status: DC | PRN

## 2016-05-14 MED ORDER — IBUPROFEN 600 MG PO TABS
600.0000 mg | ORAL_TABLET | Freq: Four times a day (QID) | ORAL | Status: DC
  Administered 2016-05-14 – 2016-05-15 (×4): 600 mg via ORAL
  Filled 2016-05-14 (×4): qty 1

## 2016-05-14 MED ORDER — LACTATED RINGERS IV SOLN: 500.0000 mL | INTRAVENOUS | Status: DC | PRN

## 2016-05-14 MED ORDER — PHENYLEPHRINE 40 MCG/ML (10ML) SYRINGE FOR IV PUSH (FOR BLOOD PRESSURE SUPPORT)
80.0000 ug | PREFILLED_SYRINGE | INTRAVENOUS | Status: DC | PRN
  Administered 2016-05-14: 80 ug via INTRAVENOUS
  Filled 2016-05-14: qty 5

## 2016-05-14 MED ORDER — DIBUCAINE 1 % RE OINT: 1.0000 "application " | TOPICAL_OINTMENT | RECTAL | Status: DC | PRN

## 2016-05-14 MED ORDER — SIMETHICONE 80 MG PO CHEW: 80.0000 mg | CHEWABLE_TABLET | ORAL | Status: DC | PRN

## 2016-05-14 MED ORDER — FENTANYL 2.5 MCG/ML BUPIVACAINE 1/10 % EPIDURAL INFUSION (WH - ANES)
INTRAMUSCULAR | Status: AC
  Filled 2016-05-14: qty 100

## 2016-05-14 MED ORDER — PRENATAL MULTIVITAMIN CH
1.0000 | ORAL_TABLET | Freq: Every day | ORAL | Status: DC
  Administered 2016-05-15: 1 via ORAL
  Filled 2016-05-14: qty 1

## 2016-05-14 MED ORDER — WITCH HAZEL-GLYCERIN EX PADS: 1.0000 "application " | MEDICATED_PAD | CUTANEOUS | Status: DC | PRN

## 2016-05-14 MED ORDER — OXYTOCIN 40 UNITS IN LACTATED RINGERS INFUSION - SIMPLE MED
1.0000 m[IU]/min | INTRAVENOUS | Status: DC
Start: 2016-05-14 — End: 2016-05-14
  Administered 2016-05-14: 4 m[IU]/min via INTRAVENOUS
  Administered 2016-05-14: 2 m[IU]/min via INTRAVENOUS
  Filled 2016-05-14: qty 1000

## 2016-05-14 MED ORDER — PHENYLEPHRINE 40 MCG/ML (10ML) SYRINGE FOR IV PUSH (FOR BLOOD PRESSURE SUPPORT)
PREFILLED_SYRINGE | INTRAVENOUS | Status: AC
  Filled 2016-05-14: qty 20

## 2016-05-14 MED ORDER — LIDOCAINE HCL (PF) 1 % IJ SOLN
INTRAMUSCULAR | Status: DC | PRN
  Administered 2016-05-14 (×2): 4 mL

## 2016-05-14 MED ORDER — SOD CITRATE-CITRIC ACID 500-334 MG/5ML PO SOLN: 30.0000 mL | ORAL | Status: DC | PRN

## 2016-05-14 MED ORDER — DIPHENHYDRAMINE HCL 25 MG PO CAPS: 25.0000 mg | ORAL_CAPSULE | Freq: Four times a day (QID) | ORAL | Status: DC | PRN

## 2016-05-14 MED ORDER — TERBUTALINE SULFATE 1 MG/ML IJ SOLN
0.2500 mg | Freq: Once | INTRAMUSCULAR | Status: DC | PRN
  Filled 2016-05-14: qty 1

## 2016-05-14 MED ORDER — OXYCODONE-ACETAMINOPHEN 5-325 MG PO TABS: 1.0000 | ORAL_TABLET | ORAL | Status: DC | PRN

## 2016-05-14 MED ORDER — ONDANSETRON HCL 4 MG/2ML IJ SOLN: 4.0000 mg | INTRAMUSCULAR | Status: DC | PRN

## 2016-05-14 MED ORDER — EPHEDRINE 5 MG/ML INJ
INTRAVENOUS | Status: AC
  Filled 2016-05-14: qty 4

## 2016-05-14 MED ORDER — ZOLPIDEM TARTRATE 5 MG PO TABS: 5.0000 mg | ORAL_TABLET | Freq: Every evening | ORAL | Status: DC | PRN

## 2016-05-14 MED ORDER — ONDANSETRON HCL 4 MG PO TABS: 4.0000 mg | ORAL_TABLET | ORAL | Status: DC | PRN

## 2016-05-14 MED ORDER — COCONUT OIL OIL
1.0000 "application " | TOPICAL_OIL | Status: DC | PRN
  Filled 2016-05-14: qty 120

## 2016-05-14 MED ORDER — BENZOCAINE-MENTHOL 20-0.5 % EX AERO
1.0000 "application " | INHALATION_SPRAY | CUTANEOUS | Status: DC | PRN
  Administered 2016-05-14: 1 via TOPICAL
  Filled 2016-05-14: qty 56

## 2016-05-14 MED ORDER — OXYTOCIN BOLUS FROM INFUSION
500.0000 mL | Freq: Once | INTRAVENOUS | Status: AC
  Administered 2016-05-14: 500 mL via INTRAVENOUS

## 2016-05-14 MED ORDER — LIDOCAINE HCL (PF) 1 % IJ SOLN
30.0000 mL | INTRAMUSCULAR | Status: DC | PRN
  Filled 2016-05-14: qty 30

## 2016-05-14 MED ORDER — LACTATED RINGERS IV SOLN
INTRAVENOUS | Status: DC
  Administered 2016-05-14: 09:00:00 via INTRAVENOUS

## 2016-05-14 MED ORDER — ACETAMINOPHEN 325 MG PO TABS
650.0000 mg | ORAL_TABLET | ORAL | Status: DC | PRN
  Administered 2016-05-14: 650 mg via ORAL
  Filled 2016-05-14: qty 2

## 2016-05-14 MED ORDER — FENTANYL 2.5 MCG/ML BUPIVACAINE 1/10 % EPIDURAL INFUSION (WH - ANES)
14.0000 mL/h | INTRAMUSCULAR | Status: DC | PRN
  Administered 2016-05-14 (×2): 14 mL/h via EPIDURAL

## 2016-05-14 MED ORDER — FLEET ENEMA 7-19 GM/118ML RE ENEM: 1.0000 | ENEMA | RECTAL | Status: DC | PRN

## 2016-05-14 MED ORDER — OXYCODONE-ACETAMINOPHEN 5-325 MG PO TABS: 2.0000 | ORAL_TABLET | ORAL | Status: DC | PRN

## 2016-05-14 MED ORDER — LACTATED RINGERS IV SOLN: 500.0000 mL | Freq: Once | INTRAVENOUS | Status: DC

## 2016-05-14 MED ORDER — FENTANYL CITRATE (PF) 100 MCG/2ML IJ SOLN
100.0000 ug | INTRAMUSCULAR | Status: DC | PRN
  Administered 2016-05-14 (×2): 100 ug via INTRAVENOUS
  Filled 2016-05-14 (×2): qty 2

## 2016-05-14 MED ORDER — TETANUS-DIPHTH-ACELL PERTUSSIS 5-2.5-18.5 LF-MCG/0.5 IM SUSP: 0.5000 mL | Freq: Once | INTRAMUSCULAR | Status: DC

## 2016-05-14 MED ORDER — DIPHENHYDRAMINE HCL 50 MG/ML IJ SOLN: 12.5000 mg | INTRAMUSCULAR | Status: DC | PRN

## 2016-05-14 NOTE — Anesthesia Procedure Notes (Signed)
Epidural Patient location during procedure: OB  Staffing Anesthesiologist: Deanna Wiater Performed: anesthesiologist   Preanesthetic Checklist Completed: patient identified, pre-op evaluation, timeout performed, IV checked, risks and benefits discussed and monitors and equipment checked  Epidural Patient position: sitting Prep: site prepped and draped and DuraPrep Patient monitoring: heart rate, continuous pulse ox and blood pressure Approach: midline Location: L3-L4 Injection technique: LOR air and LOR saline  Needle:  Needle type: Tuohy  Needle gauge: 17 G Needle length: 9 cm Needle insertion depth: 6 cm Catheter type: closed end flexible Catheter size: 19 Gauge Catheter at skin depth: 12 cm Test dose: negative  Assessment Sensory level: T8 Events: blood not aspirated, injection not painful, no injection resistance, negative IV test and no paresthesia  Additional Notes Reason for block:procedure for pain     
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
## 2016-05-14 NOTE — Anesthesia Preprocedure Evaluation (Signed)
Anesthesia Evaluation  Patient identified by MRN, date of birth, ID band Patient awake    Reviewed: Allergy & Precautions, NPO status , Patient's Chart, lab work & pertinent test results  Airway Mallampati: II  TM Distance: >3 FB Neck ROM: Full    Dental no notable dental hx.    Pulmonary asthma , former smoker,    Pulmonary exam normal breath sounds clear to auscultation       Cardiovascular negative cardio ROS Normal cardiovascular exam Rhythm:Regular Rate:Normal     Neuro/Psych PSYCHIATRIC DISORDERS Bipolar Disorder negative neurological ROS     GI/Hepatic negative GI ROS, Neg liver ROS,   Endo/Other  negative endocrine ROS  Renal/GU negative Renal ROS     Musculoskeletal negative musculoskeletal ROS (+)   Abdominal   Peds  Hematology negative hematology ROS (+)   Anesthesia Other Findings   Reproductive/Obstetrics (+) Pregnancy                             Anesthesia Physical Anesthesia Plan  ASA: II  Anesthesia Plan: Epidural   Post-op Pain Management:    Induction:   Airway Management Planned:   Additional Equipment:   Intra-op Plan:   Post-operative Plan:   Informed Consent: I have reviewed the patients History and Physical, chart, labs and discussed the procedure including the risks, benefits and alternatives for the proposed anesthesia with the patient or authorized representative who has indicated his/her understanding and acceptance.     Plan Discussed with:   Anesthesia Plan Comments:         Anesthesia Quick Evaluation

## 2016-05-14 NOTE — Progress Notes (Signed)
Connie KnucklesChristian Jamse Cabrera is a 26 y.o. G4P0030 at 3387w5d by ultrasound admitted for rupture of membranes  Subjective:   Objective: BP (!) 101/58   Pulse 76   Temp 98.4 F (36.9 C) (Oral)   Resp 18   Ht 5\' 7"  (1.702 m)   Wt 180 lb (81.6 kg)   LMP 08/16/2015   SpO2 100%   BMI 28.19 kg/m  No intake/output data recorded. No intake/output data recorded.  FHT:  FHR: 125-130 bpm, variability: moderate,  accelerations:  Present,  decelerations:  Absent UC:   regular, every 2-3 minutes SVE:   Dilation: 6.5 Effacement (%): 90 Station: 0 Exam by:: D Herr rn  Labs: Lab Results  Component Value Date   WBC 11.1 (H) 05/14/2016   HGB 12.4 05/14/2016   HCT 36.9 05/14/2016   MCV 88.5 05/14/2016   PLT 194 05/14/2016    Assessment / Plan: Augmentation of labor, progressing well  Labor: Progressing normally Preeclampsia:  no signs or symptoms of toxicity and intake and ouput balanced Fetal Wellbeing:  Category I Pain Control:  Epidural I/D:  n/a Anticipated MOD:  NSVD  Connie Cabrera 05/14/2016, 2:00 PM

## 2016-05-15 LAB — RPR: RPR: NONREACTIVE

## 2016-05-15 MED ORDER — IBUPROFEN 600 MG PO TABS: 600.0000 mg | ORAL_TABLET | Freq: Four times a day (QID) | ORAL | 0 refills | Status: DC

## 2016-05-15 NOTE — Lactation Note (Signed)
This note was copied from a baby's chart. Lactation Consultation Note  Patient Name: Connie Kela MillinChristian Faughn RUEAV'WToday's Date: 05/15/2016  Follow up visit made.  Mom states she put baby to breast this AM using shield and it hurt too much so she has decided to pump and bottle feed.  Baby has taken all the colostrum pumped thus far.  I gave mom alimentum to supplement with if not enough breastmilk available.  Instructed to give 10-20 mls total of expressed milk/formula every 3 hours.  [facility]   Maternal Data    Feeding Feeding Type: Breast Fed Length of feed: 2 min  LATCH Score/Interventions                      Lactation Tools Discussed/Used     Consult Status      Connie FoleyMOULDEN, Connie Pagel S 05/15/2016, 12:38 PM

## 2016-05-15 NOTE — Anesthesia Postprocedure Evaluation (Signed)
Anesthesia Post Note  Patient: Connie Cabrera  Procedure(s) Performed: * No procedures listed *  Patient location during evaluation: Mother Baby Anesthesia Type: Epidural Level of consciousness: awake Pain management: pain level controlled Vital Signs Assessment: post-procedure vital signs reviewed and stable Respiratory status: spontaneous breathing Cardiovascular status: stable Postop Assessment: no headache, epidural receding and patient able to bend at knees Anesthetic complications: no        Last Vitals:  Vitals:   05/14/16 2312 05/15/16 0503  BP: (!) 108/59 114/60  Pulse: 71 71  Resp: 18 20  Temp: 36.4 C 36.8 C    Last Pain:  Vitals:   05/15/16 0536  TempSrc:   PainSc: 6    Pain Goal: Patients Stated Pain Goal: 1 (05/14/16 0905)               Edison PaceWILKERSON,Cambre Matson

## 2016-05-15 NOTE — Discharge Summary (Signed)
OB Discharge Summary  Patient Name: Connie Cabrera DOB: 09/05/1990 MRN: [id number]  Date of admission: 05/14/2016 Delivering MD: Wendee BeaversMCMULLEN, DAVID J   Date of discharge: 05/15/2016  Admitting diagnosis: 37 WEEKS WATER BREAKING Intrauterine pregnancy: 8068w5d     Secondary diagnosis:Active Problems:   Indication for care in labor or delivery  Additional problems:none     Discharge diagnosis: Term Pregnancy Delivered                                                                     Post partum procedures:n/a  Augmentation: Pitocin  Complications: None  Hospital course:  Onset of Labor With Vaginal Delivery     26 y.o. yo [id number] at 8468w5d was admitted in Active Labor on 05/14/2016. Patient had an uncomplicated labor course as follows:  Membrane Rupture Time/Date: 7:05 AM ,05/14/2016   Intrapartum Procedures: Episiotomy: None [1]                                         Lacerations:  None [1]  Patient had a delivery of a Viable infant. 05/14/2016  Information for the patient's newborn:  Jamse BelfastBonnette, Girl Ephriam KnucklesChristian [[id number]][[id number]]  Delivery Method: Vaginal, Spontaneous Delivery (Filed from Delivery Summary)    [name] had an uncomplicated postpartum course.  She is ambulating, tolerating a regular diet, passing flatus, and urinating well. Patient is discharged home in stable condition on 05/15/16.   Physical exam  Vitals:   05/14/16 1804 05/14/16 1909 05/14/16 2312 05/15/16 0503  BP: 110/62 111/62 (!) 108/59 114/60  Pulse: 83 68 71 71  Resp: 16 16 18 20   Temp: 99 F (37.2 C) 98.4 F (36.9 C) 97.5 F (36.4 C) 98.3 F (36.8 C)  TempSrc: Oral Oral Oral   SpO2: 98%  98%   Weight:      Height:       General: alert, cooperative and no distress Lochia: appropriate Uterine Fundus: firm Incision: N/A DVT Evaluation: No evidence of DVT seen on physical exam. Labs: Lab Results  Component Value Date   WBC 11.1 (H) 05/14/2016   HGB 12.4 05/14/2016   HCT 36.9 05/14/2016   MCV 88.5 05/14/2016   PLT 194 05/14/2016   No flowsheet data found.  Discharge instruction: per After Visit Summary and "Baby and Me Booklet".  After Visit Meds:  Allergies as of 05/15/2016   No Known Allergies     Medication List    STOP taking these medications   phenylephrine 10 MG Tabs tablet Commonly known as:  SUDAFED PE   promethazine 12.5 MG tablet Commonly known as:  PHENERGAN     TAKE these medications   albuterol 108 (90 Base) MCG/ACT inhaler Commonly known as:  PROVENTIL HFA;VENTOLIN HFA Inhale 1-2 puffs into the lungs every 6 (six) hours as needed for wheezing or shortness of breath.   hydrocortisone 25 MG suppository Commonly known as:  ANUSOL-HC Place 1 suppository (25 mg total) rectally 2 (two) times daily.   ibuprofen 600 MG tablet Commonly known as:  ADVIL,MOTRIN Take 1 tablet (600 mg total) by mouth every 6 (six) hours.       Diet:  routine diet  Activity: Advance as tolerated. Pelvic rest for 6 weeks.    Outpatient follow up:6 wks Follow up Appt:Future Appointments Date Time Provider Department Center  05/17/2016 8:30 AM Adam Phenix, MD [facility] None  05/24/2016 8:30 AM Catalina Antigua, MD [facility] None   Follow up visit: No Follow-up on file.  Postpartum contraception: Undecided  Newborn Data: Live born female  Birth Weight: 5 lb 11.4 oz (2591 g) APGAR: 8, 9  Baby Feeding: Breast Disposition:home with mother   05/15/2016 Wyvonnia Dusky, CNM

## 2016-05-15 NOTE — Lactation Note (Addendum)
This note was copied from a baby's chart. Lactation Consultation Note Attempted to finger feed baby. Has no suck swallow coordination. Chews and tongue thrust.  Fitted mom w/#20 NS. To tight, Fitted w/#24, had a little room, baby latched, flanged lips and chin tug. Baby suckling well on both NS. Mom didn't like NS and stated she would rather pump and bottle feed.  Had given mom shells to wear in bra. Moms breast tender to touch. Has some edema to areola. Mom couldn't tolerate touching nipple or areola.  Mom using DEBP, states a little uncomfortable, but not to bad, tolerable. Mom stated that she has sensitive nipples. Mom pumps good colostrum. Coconut oil given and applied to nipples after BF. Encouraged STS. Baby suckling on hand. Discussed bottle feeding since baby cueing. Gave 4 ml colostrum after a few minutes of chewing and tongue thrusting on nipple. Attempted to give newly pumped colostrum, baby chewed nipple squirting colostrum into mouth. Wouldn't swallow. spit colostrum out. Then spit up colostrum after feeding.  Baby sleeping. Encouraged mom to rest. Feed w/cues, or attempt to feed in 2-3 hrs. Reported to RN of status.  Patient Name: Connie Tannah Dauphine EKela MillinVWU'JToday's Date: 05/15/2016 Reason for consult: Follow-up assessment;Infant < 6lbs;Difficult latch   Maternal Data Has patient been taught Hand Expression?: Yes Does the patient have breastfeeding experience prior to this delivery?: No  Feeding Feeding Type: Breast Milk Length of feed: 0 min  LATCH Score/Interventions Latch: Repeated attempts needed to sustain latch, nipple held in mouth throughout feeding, stimulation needed to elicit sucking reflex. Intervention(s): Skin to skin;Teach feeding cues;Waking techniques Intervention(s): Adjust position;Assist with latch;Breast massage;Breast compression  Audible Swallowing: None Intervention(s): Skin to skin;Hand expression  Type of Nipple: Flat Intervention(s): Shells;Double  electric pump  Comfort (Breast/Nipple): Soft / non-tender     Hold (Positioning): Full assist, staff holds infant at breast Intervention(s): Breastfeeding basics reviewed;Support Pillows;Position options;Skin to skin  LATCH Score: 4  Lactation Tools Discussed/Used Tools: Pump;Shells;Nipple Shields Nipple shield size: 24 Shell Type: Inverted Breast pump type: Double-Electric Breast Pump   Consult Status Consult Status: Follow-up Date: 05/15/16 Follow-up type: In-patient    Lindley Hiney, Diamond NickelLAURA G 05/15/2016, 3:04 AM

## 2016-05-15 NOTE — Lactation Note (Signed)
This note was copied from a baby's chart. Lactation Consultation Note Baby sleepy. RN set up DEBP. Mom pumped 23 ml colostrum. Baby to sleepy, only took approx. 2 ml per mom. The rest of colostrum in ref. MGM holding baby swaddled. Encouraged mom to do STS. Mom stated she had been doing STS for a good while. Praised mom for pumping and amount of colostrum. Educated on supply and demand. Discussed behavior of baby less than 6 lbs, feeding habits and possible need for supplementing.  Lab came in to draw CBG on baby. [facility], mom stated yes. Patient Name: [name]'UToday's Date: 05/15/2016 Reason for consult: Initial assessment   Maternal Data Has patient been taught Hand Expression?: Yes Does the patient have breastfeeding experience prior to this delivery?: No  Feeding Feeding Type: Breast Fed  LATCH Score/Interventions                      Lactation Tools Discussed/Used Tools: 90F feeding tube / Syringe Breast pump type: Double-Electric Breast Pump   Consult Status Consult Status: Follow-up Date: 05/15/16 Follow-up type: In-patient    Jhonnie Aliano, Diamond NickelLAURA G 05/15/2016, 1:44 AM

## 2016-05-16 ENCOUNTER — Encounter: Payer: Medicaid Other | Admitting: Obstetrics and Gynecology

## 2016-05-16 ENCOUNTER — Ambulatory Visit: Payer: Self-pay

## 2016-05-16 NOTE — Lactation Note (Addendum)
This note was copied from a baby's chart. Lactation Consultation Note Mom only supplementing small amounts. Going by supplementing chart if she was BF then supplementing. Explained to mom since she isn't putting baby to the breast any more, mom needs to go by the formula amount which for baby's age would be 15ml -30ml. Mom stated the baby hasn't been satisfied wanting to feed every 2 hours. Explained since she is little she may need to eat every 2 hrs. They usually eat small amounts frequently. Mom in understanding of feeding plan. Mom is pumping, giving colostrum them formula. [facility] Mom has an appt. Today at 10:30. [facility], [facility] and is ready for [facility]/c home.  Stressed the importance of cont.to write down the output and intake of the baby, take to the MD appt.  Patient Name: Connie Cabrera's Date: 05/16/2016 Reason for consult: Follow-up assessment;Infant < 6lbs   Maternal Data    Feeding    LATCH Score/Interventions                      Lactation Tools Discussed/Used     Consult Status Consult Status: Follow-up Date: 05/16/16 Follow-up type: In-patient    Charyl DancerCARVER, Wilson Dusenbery G 05/16/2016, 4:13 AM

## 2016-05-16 NOTE — Lactation Note (Signed)
This note was copied from a baby's chart. Lactation Consultation Note  Patient Name: Connie Cabrera's Date: 05/16/2016 Reason for consult: Follow-up assessment;Infant < 6lbs Mom plans to pump/bottle feed. [facility]. Stressed importance of pumping every 3 hours for 15-20 minutes to encourage milk production, prevent engorgement and protect milk supply. Breast milk storage guidelines discussed. Supplemental guidelines reviewed. Engorgement care reviewed if needed.  Advised of OP services and support group. Mom denies other questions/concerns.   Maternal Data    Feeding Feeding Type: Bottle Fed - Formula Nipple Type: Slow - flow  LATCH Score/Interventions                      Lactation Tools Discussed/Used Tools: Pump Breast pump type: Double-Electric Breast Pump   Consult Status Consult Status: Complete Date: 05/16/16 Follow-up type: In-patient    Alfred LevinsGranger, Kasie Leccese Ann 05/16/2016, 8:44 AM

## 2016-05-17 ENCOUNTER — Encounter: Payer: Medicaid Other | Admitting: Obstetrics & Gynecology

## 2016-05-21 NOTE — Progress Notes (Signed)
Late Entry/copied from baby's chart: CLINICAL SOCIAL WORK MATERNAL/CHILD NOTE  Patient Details  Name: Connie Cabrera MRN: [id number] Date of Birth: 06/20/1990  Date:  05/15/2016  Clinical Social Worker Initiating Note:   , LCSW Date/ Time Initiated:  05/15/16/1600     Child's Name:  Connie Cabrera   Legal Guardian:  Other (Comment) (Parents: [name] and Hersell Cabrera)   Need for Interpreter:  None   Date of Referral:  05/16/16     Reason for Referral:  Other (Comment) (Hx of Bipolar)   Referral Source:  Central Nursery   Address:  [address] [place], [place] [address]  Phone number:  [phone]   Household Members:  Significant Other   Natural Supports (not living in the home):  Immediate Family, Extended Family, Friends   Professional Supports: None   Employment: Part-time   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  FOB works for [employer]   Education:      Financial Resources:  Medicaid   Other Resources:      Cultural/Religious Considerations Which May Impact Care: None stated.  Strengths:  Ability to meet basic needs , Home prepared for child , Pediatrician chosen    Risk Factors/Current Problems:  None   Cognitive State:  Alert , Distractible , Linear Thinking , Goal Oriented    Mood/Affect:  Euthymic , Calm , Relaxed    CSW Assessment: CSW met with MOB in her first floor room to offer support and complete assessment due to hx of Bipolar.  MOB was pleasant and welcoming, although CSW found her easily distracted and minimally interested in CSW's visit.  She answered her phone throughout assessment numerous times.  Her mother was present and MOB gave permission to speak openly with MGM in the room. MOB states she and baby are doing well.  She states excitement about becoming a mother.  She states she and FOB met at a funeral and she moved to [place] to be with him.  She reports that they have a positive relationship and  that he is involved and supportive.  This is her first baby and his second.  MGM is visiting from [place].  She states, "I give her six months before she and baby are home with me in [place]."  MOB took this comment in stride and states she will not be moving home.  MGM seemed minimally supportive and more interested in talking about herself and how her other daughter moved back home after having a child five years ago.  She reports that her now 22 year old daughter and 5 year old grandson still live with her.  CSW noted that MOB is 10 years older than MGM's first daughter was when she had her child.   MOB was dismissive of her hx of Bipolar, stating she was diagnosed as a teenager when she and her mother were having issues.  MOB states no symptoms or concerns at this time and identifies no need for mental health treatment.  CSW provided education regarding PMADs and encouraged her to speak to a medical professional if she has concerns at any time.  MOB agreed. MOB reports that she has all necessary supplies for infant at home and is aware of SIDS precautions as reviewed by CSW.   MOB states no questions, concerns or needs at this time.  CSW Plan/Description:  Patient/Family Education , No Further Intervention Required/No Barriers to Discharge    ,  Elizabeth, LCSW 05/15/2016, 4:00 PM   

## 2016-05-24 ENCOUNTER — Encounter: Payer: Medicaid Other | Admitting: Obstetrics and Gynecology

## 2016-06-12 ENCOUNTER — Encounter: Payer: Self-pay | Admitting: Obstetrics and Gynecology

## 2016-06-12 ENCOUNTER — Ambulatory Visit (INDEPENDENT_AMBULATORY_CARE_PROVIDER_SITE_OTHER): Payer: Medicaid Other | Admitting: Obstetrics and Gynecology

## 2016-06-12 DIAGNOSIS — F53 Postpartum depression: Secondary | ICD-10-CM

## 2016-06-12 DIAGNOSIS — O99345 Other mental disorders complicating the puerperium: Secondary | ICD-10-CM

## 2016-06-12 MED ORDER — NORGESTIMATE-ETH ESTRADIOL 0.25-35 MG-MCG PO TABS: 1.0000 | ORAL_TABLET | Freq: Every day | ORAL | 11 refills | Status: DC

## 2016-06-12 MED ORDER — SERTRALINE HCL 50 MG PO TABS: 50.0000 mg | ORAL_TABLET | Freq: Every day | ORAL | 3 refills | Status: DC

## 2016-06-12 NOTE — Progress Notes (Signed)
Subjective:     Connie Cabrera is a 26 y.o. female who presents for a postpartum visit. She is 4 weeks postpartum following a spontaneous vaginal delivery. I have fully reviewed the prenatal and intrapartum course. The delivery was at 37.5 gestational weeks due to SROM. Outcome: spontaneous vaginal delivery. Anesthesia: epidural. Postpartum course has been uncomplicated. Baby's course has been uncomplicated. Baby is feeding by bottle - Similac Advance. Bleeding no bleeding. Bowel function is normal. Bladder function is normal. Patient is not sexually active. Contraception method is abstinence. Postpartum depression screening: positive. Patient denies suicidal/homicidal ideations. She has her in laws available for help but doesn't feel that she is able to rest around them. She has a history of bipolar/depression disorder and has discontinued medications since the age of 26.     Review of Systems Pertinent items are noted in HPI.   Objective:    BP 108/70   Pulse 79   Temp 97.5 F (36.4 C)   Wt 165 lb (74.8 kg)   LMP 05/02/2016   Breastfeeding? No   BMI 25.84 kg/m   General:  alert, cooperative and no distress   Breasts:  inspection negative, no nipple discharge or bleeding, no masses or nodularity palpable  Lungs: clear to auscultation bilaterally  Heart:  regular rate and rhythm  Abdomen: soft, non-tender; bowel sounds normal; no masses,  no organomegaly   Vulva:  normal  Vagina: normal vagina, no discharge, exudate, lesion, or erythema  Cervix:  multiparous appearance  Corpus: normal size, contour, position, consistency, mobility, non-tender  Adnexa:  normal adnexa and no mass, fullness, tenderness  Rectal Exam: Not performed.        Assessment:     Normal postpartum exam. Pap smear not done at today's visit.   Plan:    1. Contraception: OCP (estrogen/progesterone) Rx Sprintec provided 2. Patient is medically cleared to resume all activities of daily living.  3. Patient  referred to see jamie due to pp depression. Discussed the benefits of taking an antidepressant in conjunction with meeting with behavioral health specialist. Patient agreed to Rx Zoloft 3. Follow up in: 6 months or as needed.

## 2016-06-12 NOTE — Progress Notes (Deleted)
Subjective:     Connie Cabrera is a 26 y.o. female who presents for a postpartum visit. She is 4 weeks postpartum following a spontaneous vaginal delivery. I have fully reviewed the prenatal and intrapartum course. The delivery was at 37w 5d gestational weeks. Outcome: spontaneous vaginal delivery. Anesthesia: epidural. Postpartum course has been UNREMARKABLE. Baby's course has been UNREMARKABLE. Baby is feeding by {breast/bottle:69}. Bleeding no bleeding. Bowel function is normal. Bladder function is normal. Patient is not sexually active. Contraception method is abstinence. Postpartum depression screening: positive.  {Common ambulatory SmartLinks:19316}  Review of Systems {ros; complete:30496}   Objective:    BP 108/70   Pulse 79   Temp 97.5 F (36.4 C)   Wt 165 lb (74.8 kg)   LMP 05/02/2016   Breastfeeding? No   BMI 25.84 kg/m   General:  {gen appearance:16600}   Breasts:  {breast exam:1202::"inspection negative, no nipple discharge or bleeding, no masses or nodularity palpable"}  Lungs: {lung exam:16931}  Heart:  {heart exam:5510}  Abdomen: {abdomen exam:16834}   Vulva:  {labia exam:12198}  Vagina: {vagina exam:12200}  Cervix:  {cervix exam:14595}  Corpus: {uterus exam:12215}  Adnexa:  {adnexa exam:12223}  Rectal Exam: {rectal/vaginal exam:12274}        Assessment:    *** postpartum exam. Pap smear {done:10129} at today's visit.   Plan:    1. Contraception: {method:5051} 2. *** 3. Follow up in: {1-10:13787} {time; units:19136} or as needed.

## 2016-06-15 ENCOUNTER — Telehealth: Payer: Self-pay | Admitting: *Deleted

## 2016-06-15 ENCOUNTER — Other Ambulatory Visit: Payer: Self-pay | Admitting: Certified Nurse Midwife

## 2016-06-15 DIAGNOSIS — F53 Postpartum depression: Secondary | ICD-10-CM

## 2016-06-15 DIAGNOSIS — O99345 Other mental disorders complicating the puerperium: Secondary | ICD-10-CM

## 2016-06-15 DIAGNOSIS — K047 Periapical abscess without sinus: Secondary | ICD-10-CM

## 2016-06-15 MED ORDER — CEPHALEXIN 500 MG PO CAPS: 500.0000 mg | ORAL_CAPSULE | Freq: Three times a day (TID) | ORAL | 0 refills | Status: DC

## 2016-06-15 NOTE — Telephone Encounter (Signed)
Patient notified

## 2016-06-15 NOTE — Telephone Encounter (Signed)
Patient states she awoke this morning with her face swollen due to an infected tooth- she wants to know if we can send her antibiotic. Patient is post partum and she is not breast feeding. Patient made an appointment with dentist next week- but they told her to call us for antibiotic to take until her appointment so she could have her tooth pulled or worked if needed. (she is getting ready to lose her insurance next week) [facility] if we could [facility] something in for her- also she got very upset at checkout and did not get her referral to behavioral health for her post partum depression and has had no follow up call to schedule. She needs to go can she get that done.

## 2016-06-15 NOTE — Telephone Encounter (Signed)
Please let her know that I have sent in Keflex for her to take.  Thank you.  R.Jadan Hinojos CNM Also, let her know that I have placed a referral for her to see [facility]/  Thank you.

## 2016-07-16 ENCOUNTER — Other Ambulatory Visit: Payer: Self-pay | Admitting: Obstetrics

## 2016-12-04 ENCOUNTER — Encounter (HOSPITAL_COMMUNITY): Payer: Self-pay | Admitting: *Deleted

## 2016-12-04 ENCOUNTER — Inpatient Hospital Stay (HOSPITAL_COMMUNITY)
Admission: AD | Admit: 2016-12-04 | Discharge: 2016-12-04 | Disposition: A | Discharge status: Home / Self Care | Payer: Medicaid Other | Source: Ambulatory Visit | Attending: Obstetrics & Gynecology | Admitting: Obstetrics & Gynecology

## 2016-12-04 DIAGNOSIS — J069 Acute upper respiratory infection, unspecified: Secondary | ICD-10-CM

## 2016-12-04 DIAGNOSIS — Z3201 Encounter for pregnancy test, result positive: Secondary | ICD-10-CM | POA: Insufficient documentation

## 2016-12-04 DIAGNOSIS — R0981 Nasal congestion: Secondary | ICD-10-CM | POA: Diagnosis present

## 2016-12-04 DIAGNOSIS — O9989 Other specified diseases and conditions complicating pregnancy, childbirth and the puerperium: Secondary | ICD-10-CM

## 2016-12-04 DIAGNOSIS — Z3491 Encounter for supervision of normal pregnancy, unspecified, first trimester: Secondary | ICD-10-CM

## 2016-12-04 DIAGNOSIS — Z87891 Personal history of nicotine dependence: Secondary | ICD-10-CM | POA: Insufficient documentation

## 2016-12-04 LAB — URINALYSIS, ROUTINE W REFLEX MICROSCOPIC
Bilirubin Urine: NEGATIVE
GLUCOSE, UA: NEGATIVE mg/dL
Hgb urine dipstick: NEGATIVE
Ketones, ur: 20 mg/dL — AB
NITRITE: NEGATIVE
PH: 6 (ref 5.0–8.0)
Protein, ur: 30 mg/dL — AB
SPECIFIC GRAVITY, URINE: 1.024 (ref 1.005–1.030)

## 2016-12-04 LAB — POCT PREGNANCY, URINE: Preg Test, Ur: POSITIVE — AB

## 2016-12-04 NOTE — Discharge Instructions (Signed)
Upper Respiratory Infection, Adult Most upper respiratory infections (URIs) are caused by a virus. A URI affects the nose, throat, and upper air passages. The most common type of URI is often called "the common cold." Follow these instructions at home:  Take medicines only as told by your doctor.  Gargle warm saltwater or take cough drops to comfort your throat as told by your doctor.  Use a warm mist humidifier or inhale steam from a shower to increase air moisture. This may make it easier to breathe.  Drink enough fluid to keep your pee (urine) clear or pale yellow.  Eat soups and other clear broths.  Have a healthy diet.  Rest as needed.  Go back to work when your fever is gone or your doctor says it is okay. ? You may need to stay home longer to avoid giving your URI to others. ? You can also wear a face mask and wash your hands often to prevent spread of the virus.  Use your inhaler more if you have asthma.  Do not use any tobacco products, including cigarettes, chewing tobacco, or electronic cigarettes. If you need help quitting, ask your doctor. Contact a doctor if:  You are getting worse, not better.  Your symptoms are not helped by medicine.  You have chills.  You are getting more short of breath.  You have brown or red mucus.  You have yellow or brown discharge from your nose.  You have pain in your face, especially when you bend forward.  You have a fever.  You have puffy (swollen) neck glands.  You have pain while swallowing.  You have white areas in the back of your throat. Get help right away if:  You have very bad or constant: ? Headache. ? Ear pain. ? Pain in your forehead, behind your eyes, and over your cheekbones (sinus pain). ? Chest pain.  You have long-lasting (chronic) lung disease and any of the following: ? Wheezing. ? Long-lasting cough. ? Coughing up blood. ? A change in your usual mucus.  You have a stiff neck.  You have  changes in your: ? Vision. ? Hearing. ? Thinking. ? Mood. This information is not intended to replace advice given to you by your health care provider. Make sure you discuss any questions you have with your health care provider. Document Released: 08/30/2007 Document Revised: 11/14/2015 Document Reviewed: 06/18/2013 Elsevier Interactive Patient Education  2018 [employer]vier Inc[employer]  
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
## 2016-12-04 NOTE — MAU Note (Signed)
+  Preg test a wk ago.  Just got back from seeing family, sister had pneumonia.   Congestion, eyes were swollen and puffing, no fever.  Has non-prod cough.  Throwing up and having stomach cramps.  Didn't know what she could take.

## 2016-12-26 ENCOUNTER — Inpatient Hospital Stay (HOSPITAL_COMMUNITY)
Admission: AD | Admit: 2016-12-26 | Discharge: 2016-12-26 | Discharge status: Against Medical Advice | Payer: Medicaid Other | Source: Ambulatory Visit | Attending: Family Medicine | Admitting: Family Medicine

## 2016-12-26 NOTE — MAU Note (Signed)
Pt came to desk asking why she was being treated unfairly because she has been sitting in the lobby and has not been called back. Tried explaining to pt it is based on acuity. Pt ask to have admission band cut off so she could leave

## 2016-12-27 ENCOUNTER — Telehealth: Payer: Self-pay

## 2016-12-27 ENCOUNTER — Encounter (HOSPITAL_COMMUNITY): Payer: Self-pay

## 2016-12-27 ENCOUNTER — Inpatient Hospital Stay (HOSPITAL_COMMUNITY)
Admission: AD | Admit: 2016-12-27 | Discharge: 2016-12-28 | Disposition: A | Discharge status: Home / Self Care | Payer: Medicaid Other | Source: Ambulatory Visit | Attending: Obstetrics & Gynecology | Admitting: Obstetrics & Gynecology

## 2016-12-27 ENCOUNTER — Inpatient Hospital Stay (HOSPITAL_COMMUNITY): Payer: Medicaid Other

## 2016-12-27 DIAGNOSIS — O209 Hemorrhage in early pregnancy, unspecified: Secondary | ICD-10-CM

## 2016-12-27 DIAGNOSIS — O039 Complete or unspecified spontaneous abortion without complication: Secondary | ICD-10-CM | POA: Insufficient documentation

## 2016-12-27 DIAGNOSIS — Z87891 Personal history of nicotine dependence: Secondary | ICD-10-CM | POA: Diagnosis not present

## 2016-12-27 LAB — CBC WITH DIFFERENTIAL/PLATELET
Basophils Absolute: 0 10*3/uL (ref 0.0–0.1)
Basophils Relative: 0 %
EOS PCT: 1 %
Eosinophils Absolute: 0.1 10*3/uL (ref 0.0–0.7)
HCT: 39.8 % (ref 36.0–46.0)
Hemoglobin: 13.6 g/dL (ref 12.0–15.0)
LYMPHS ABS: 4 10*3/uL (ref 0.7–4.0)
LYMPHS PCT: 32 %
MCH: 30.4 pg (ref 26.0–34.0)
MCHC: 34.2 g/dL (ref 30.0–36.0)
MCV: 88.8 fL (ref 78.0–100.0)
MONO ABS: 0.4 10*3/uL (ref 0.1–1.0)
Monocytes Relative: 3 %
Neutro Abs: 8 10*3/uL — ABNORMAL HIGH (ref 1.7–7.7)
Neutrophils Relative %: 64 %
PLATELETS: 207 10*3/uL (ref 150–400)
RBC: 4.48 MIL/uL (ref 3.87–5.11)
RDW: 13.5 % (ref 11.5–15.5)
WBC: 12.5 10*3/uL — ABNORMAL HIGH (ref 4.0–10.5)

## 2016-12-27 LAB — TYPE AND SCREEN
ABO/RH(D): A POS
Antibody Screen: NEGATIVE

## 2016-12-27 MED ORDER — OXYCODONE-ACETAMINOPHEN 5-325 MG PO TABS
1.0000 | ORAL_TABLET | Freq: Once | ORAL | Status: AC
  Administered 2016-12-27: 1 via ORAL
  Filled 2016-12-27: qty 1

## 2016-12-27 MED ORDER — PROMETHAZINE HCL 25 MG PO TABS
25.0000 mg | ORAL_TABLET | Freq: Four times a day (QID) | ORAL | 0 refills | Status: DC | PRN
Start: 2016-12-27 — End: 2017-04-09

## 2016-12-27 MED ORDER — LACTATED RINGERS IV BOLUS (SEPSIS)
1000.0000 mL | Freq: Once | INTRAVENOUS | Status: AC
  Administered 2016-12-27: 1000 mL via INTRAVENOUS

## 2016-12-27 MED ORDER — MISOPROSTOL 200 MCG PO TABS
800.0000 ug | ORAL_TABLET | Freq: Once | ORAL | Status: AC
  Administered 2016-12-27: 800 ug via ORAL
  Filled 2016-12-27: qty 4

## 2016-12-27 MED ORDER — MISOPROSTOL 200 MCG PO TABS: 800.0000 ug | ORAL_TABLET | Freq: Once | ORAL | 0 refills | Status: DC

## 2016-12-27 MED ORDER — OXYCODONE-ACETAMINOPHEN 5-325 MG PO TABS: 1.0000 | ORAL_TABLET | Freq: Four times a day (QID) | ORAL | 0 refills | Status: DC | PRN

## 2016-12-27 MED ORDER — IBUPROFEN 600 MG PO TABS
600.0000 mg | ORAL_TABLET | Freq: Once | ORAL | Status: AC
  Administered 2016-12-27: 600 mg via ORAL
  Filled 2016-12-27: qty 1

## 2016-12-27 MED ORDER — KETOROLAC TROMETHAMINE 30 MG/ML IJ SOLN
30.0000 mg | Freq: Once | INTRAMUSCULAR | Status: AC
  Administered 2016-12-27: 30 mg via INTRAVENOUS
  Filled 2016-12-27: qty 1

## 2016-12-27 NOTE — MAU Note (Signed)
Pt refused blood draw for hcg quant
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
## 2016-12-27 NOTE — Discharge Instructions (Signed)
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
## 2016-12-27 NOTE — MAU Note (Signed)
Pt asked u/s to stop doing exam during transvaginal exam

## 2016-12-28 LAB — HIV ANTIBODY (ROUTINE TESTING W REFLEX): HIV SCREEN 4TH GENERATION: NONREACTIVE

## 2017-01-03 ENCOUNTER — Encounter: Payer: Self-pay | Admitting: Certified Nurse Midwife

## 2017-01-03 ENCOUNTER — Ambulatory Visit (INDEPENDENT_AMBULATORY_CARE_PROVIDER_SITE_OTHER): Payer: Medicaid Other | Admitting: Certified Nurse Midwife

## 2017-01-03 ENCOUNTER — Ambulatory Visit: Payer: Medicaid Other | Admitting: Obstetrics

## 2017-01-03 VITALS — BP 103/67 | HR 75 | Wt 162.0 lb

## 2017-01-03 DIAGNOSIS — Z8759 Personal history of other complications of pregnancy, childbirth and the puerperium: Secondary | ICD-10-CM

## 2017-01-03 DIAGNOSIS — Z30011 Encounter for initial prescription of contraceptive pills: Secondary | ICD-10-CM

## 2017-01-03 DIAGNOSIS — F53 Postpartum depression: Secondary | ICD-10-CM

## 2017-01-03 DIAGNOSIS — O99345 Other mental disorders complicating the puerperium: Principal | ICD-10-CM

## 2017-01-03 MED ORDER — SERTRALINE HCL 50 MG PO TABS: 50.0000 mg | ORAL_TABLET | Freq: Every day | ORAL | 3 refills | Status: DC

## 2017-01-03 MED ORDER — PRENATE PIXIE 10-0.6-0.4-200 MG PO CAPS: 1.0000 | ORAL_CAPSULE | Freq: Every day | ORAL | 12 refills | Status: DC

## 2017-01-03 MED ORDER — LEVONORGEST-ETH ESTRADIOL-IRON 0.1-20 MG-MCG(21) PO TABS: 1.0000 | ORAL_TABLET | Freq: Every day | ORAL | 12 refills | Status: DC
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
## 2017-01-04 ENCOUNTER — Other Ambulatory Visit: Payer: Self-pay | Admitting: Certified Nurse Midwife

## 2017-01-04 LAB — CBC WITH DIFFERENTIAL/PLATELET
BASOS ABS: 0 10*3/uL (ref 0.0–0.2)
Basos: 0 %
EOS (ABSOLUTE): 0.1 10*3/uL (ref 0.0–0.4)
EOS: 1 %
HEMATOCRIT: 35.6 % (ref 34.0–46.6)
HEMOGLOBIN: 11.4 g/dL (ref 11.1–15.9)
Immature Grans (Abs): 0 10*3/uL (ref 0.0–0.1)
Immature Granulocytes: 0 %
LYMPHS ABS: 2.8 10*3/uL (ref 0.7–3.1)
Lymphs: 40 %
MCH: 29.8 pg (ref 26.6–33.0)
MCHC: 32 g/dL (ref 31.5–35.7)
MCV: 93 fL (ref 79–97)
MONOCYTES: 7 %
MONOS ABS: 0.5 10*3/uL (ref 0.1–0.9)
NEUTROS ABS: 3.6 10*3/uL (ref 1.4–7.0)
Neutrophils: 52 %
Platelets: 225 10*3/uL (ref 150–379)
RBC: 3.83 x10E6/uL (ref 3.77–5.28)
RDW: 14.2 % (ref 12.3–15.4)
WBC: 7 10*3/uL (ref 3.4–10.8)

## 2017-01-04 LAB — BETA HCG QUANT (REF LAB): hCG Quant: 144 m[IU]/mL

## 2017-01-19 ENCOUNTER — Encounter: Payer: Self-pay | Admitting: Certified Nurse Midwife

## 2017-03-05 ENCOUNTER — Ambulatory Visit: Payer: Medicaid Other | Admitting: Certified Nurse Midwife

## 2017-04-09 ENCOUNTER — Encounter: Payer: Self-pay | Admitting: Certified Nurse Midwife

## 2017-04-09 ENCOUNTER — Ambulatory Visit (INDEPENDENT_AMBULATORY_CARE_PROVIDER_SITE_OTHER): Payer: Medicaid Other | Admitting: Certified Nurse Midwife

## 2017-04-09 ENCOUNTER — Other Ambulatory Visit (HOSPITAL_COMMUNITY)
Admission: RE | Admit: 2017-04-09 | Discharge: 2017-04-09 | Disposition: A | Discharge status: Home / Self Care | Payer: Medicaid Other | Source: Ambulatory Visit | Attending: Certified Nurse Midwife | Admitting: Certified Nurse Midwife

## 2017-04-09 VITALS — BP 116/69 | HR 60 | Ht 67.0 in | Wt 158.1 lb

## 2017-04-09 DIAGNOSIS — F3131 Bipolar disorder, current episode depressed, mild: Secondary | ICD-10-CM

## 2017-04-09 DIAGNOSIS — O99345 Other mental disorders complicating the puerperium: Secondary | ICD-10-CM

## 2017-04-09 DIAGNOSIS — Z113 Encounter for screening for infections with a predominantly sexual mode of transmission: Secondary | ICD-10-CM

## 2017-04-09 DIAGNOSIS — F53 Postpartum depression: Secondary | ICD-10-CM

## 2017-04-09 DIAGNOSIS — F419 Anxiety disorder, unspecified: Secondary | ICD-10-CM

## 2017-04-09 DIAGNOSIS — Z01419 Encounter for gynecological examination (general) (routine) without abnormal findings: Secondary | ICD-10-CM | POA: Insufficient documentation

## 2017-04-09 DIAGNOSIS — Z0000 Encounter for general adult medical examination without abnormal findings: Secondary | ICD-10-CM | POA: Diagnosis not present

## 2017-04-09 DIAGNOSIS — F319 Bipolar disorder, unspecified: Secondary | ICD-10-CM | POA: Insufficient documentation

## 2017-04-09 DIAGNOSIS — N898 Other specified noninflammatory disorders of vagina: Secondary | ICD-10-CM

## 2017-04-09 DIAGNOSIS — Z30011 Encounter for initial prescription of contraceptive pills: Secondary | ICD-10-CM

## 2017-04-09 DIAGNOSIS — N912 Amenorrhea, unspecified: Secondary | ICD-10-CM

## 2017-04-09 MED ORDER — SERTRALINE HCL 50 MG PO TABS: 50.0000 mg | ORAL_TABLET | Freq: Every day | ORAL | 3 refills | Status: DC

## 2017-04-09 MED ORDER — NORGESTIMATE-ETH ESTRADIOL 0.25-35 MG-MCG PO TABS
1.0000 | ORAL_TABLET | Freq: Every day | ORAL | 11 refills | Status: DC
Start: 2017-04-09 — End: 2017-11-14

## 2017-04-09 NOTE — Progress Notes (Signed)
Pt presents for annual, pap, and all STD testing today. Wishes to discuss recent SAB with CNM.
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
## 2017-04-10 LAB — CERVICOVAGINAL ANCILLARY ONLY
BACTERIAL VAGINITIS: POSITIVE — AB
CANDIDA VAGINITIS: NEGATIVE
Chlamydia: NEGATIVE
Neisseria Gonorrhea: NEGATIVE
TRICH (WINDOWPATH): POSITIVE — AB

## 2017-04-10 LAB — RPR: RPR Ser Ql: NONREACTIVE

## 2017-04-10 LAB — HIV ANTIBODY (ROUTINE TESTING W REFLEX): HIV SCREEN 4TH GENERATION: NONREACTIVE

## 2017-04-10 LAB — HEPATITIS B SURFACE ANTIGEN: HEP B S AG: NEGATIVE

## 2017-04-10 LAB — HCG, SERUM, QUALITATIVE: HCG, BETA SUBUNIT, QUAL, SERUM: NEGATIVE m[IU]/mL (ref <6)

## 2017-04-10 LAB — HEPATITIS C ANTIBODY: Hep C Virus Ab: <0.1 s/co ratio (ref 0.0–0.9)

## 2017-04-12 LAB — CYTOLOGY - PAP
Diagnosis: UNDETERMINED — AB
HPV: NOT DETECTED

## 2017-04-13 ENCOUNTER — Other Ambulatory Visit: Payer: Self-pay | Admitting: Certified Nurse Midwife

## 2017-04-13 ENCOUNTER — Telehealth: Payer: Self-pay

## 2017-04-13 DIAGNOSIS — F331 Major depressive disorder, recurrent, moderate: Secondary | ICD-10-CM

## 2017-04-13 NOTE — Telephone Encounter (Signed)
Patient notified

## 2017-04-13 NOTE — Telephone Encounter (Signed)
-----   Message from Roe Coombsachelle A Denney, CNM sent at 04/13/2017  8:19 AM EST ----- Regarding: RE: referral Contact: [phone] Please tell her I put in a psychiatry referral perviously.  Verdie Mosherneetra should be reaching out to her.  I know that she needs more than psychology.  She needs medical management of Bi-Polar disorder.   Thank you Rachelle  ----- Message ----- From: Maretta BeesMcGlashan, Krystyna Cleckley J, RMA Sent: 04/12/2017   5:03 PM To: Roe Coombsachelle A Denney, CNM Subject: referral                                       Patient is requesting referral to a real certified Psychiatrist, stated that [facility] cannot/have not helped her.

## 2017-04-18 ENCOUNTER — Other Ambulatory Visit: Payer: Self-pay | Admitting: Certified Nurse Midwife

## 2017-04-18 DIAGNOSIS — A599 Trichomoniasis, unspecified: Secondary | ICD-10-CM

## 2017-04-18 MED ORDER — METRONIDAZOLE 500 MG PO TABS: 2000.0000 mg | ORAL_TABLET | Freq: Once | ORAL | 0 refills | Status: AC

## 2017-05-14 ENCOUNTER — Ambulatory Visit: Payer: Medicaid Other | Admitting: Certified Nurse Midwife

## 2017-05-15 ENCOUNTER — Other Ambulatory Visit (HOSPITAL_COMMUNITY)
Admission: RE | Admit: 2017-05-15 | Discharge: 2017-05-15 | Disposition: A | Discharge status: Home / Self Care | Payer: Medicaid Other | Source: Ambulatory Visit | Attending: Certified Nurse Midwife | Admitting: Certified Nurse Midwife

## 2017-05-15 ENCOUNTER — Ambulatory Visit: Payer: Medicaid Other | Admitting: Certified Nurse Midwife

## 2017-05-15 ENCOUNTER — Encounter: Payer: Self-pay | Admitting: Certified Nurse Midwife

## 2017-05-15 VITALS — BP 109/66 | HR 62 | Wt 154.4 lb

## 2017-05-15 DIAGNOSIS — R87610 Atypical squamous cells of undetermined significance on cytologic smear of cervix (ASC-US): Secondary | ICD-10-CM | POA: Diagnosis not present

## 2017-05-15 DIAGNOSIS — Z01411 Encounter for gynecological examination (general) (routine) with abnormal findings: Secondary | ICD-10-CM | POA: Diagnosis not present

## 2017-05-15 DIAGNOSIS — Z202 Contact with and (suspected) exposure to infections with a predominantly sexual mode of transmission: Secondary | ICD-10-CM

## 2017-05-15 DIAGNOSIS — Z01419 Encounter for gynecological examination (general) (routine) without abnormal findings: Secondary | ICD-10-CM | POA: Diagnosis present

## 2017-05-15 DIAGNOSIS — Z1151 Encounter for screening for human papillomavirus (HPV): Secondary | ICD-10-CM | POA: Diagnosis not present

## 2017-05-15 NOTE — Progress Notes (Signed)
Pt requests TOC to be done via urine. Pt does not desire BC at this time.

## 2017-05-16 LAB — CERVICOVAGINAL ANCILLARY ONLY: TRICH (WINDOWPATH): NEGATIVE

## 2017-05-20 IMAGING — US US MFM OB LIMITED
1 series · 15 of 19 positions shown · non-contrast
Comparison: none

[Series 1: us mfm ob limited · 15 of 19 slices shown]
[im 1/19]
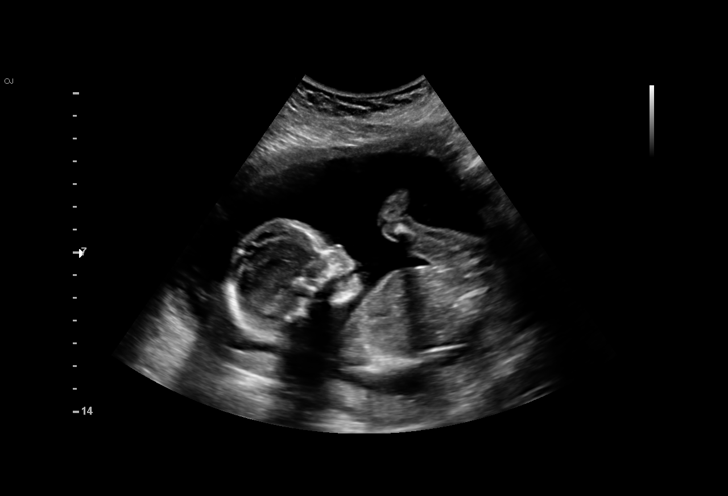
[im 2/19]
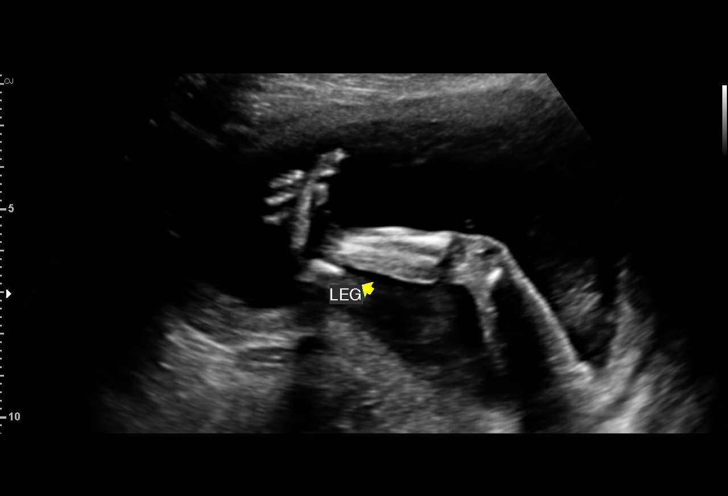
[im 4/19]
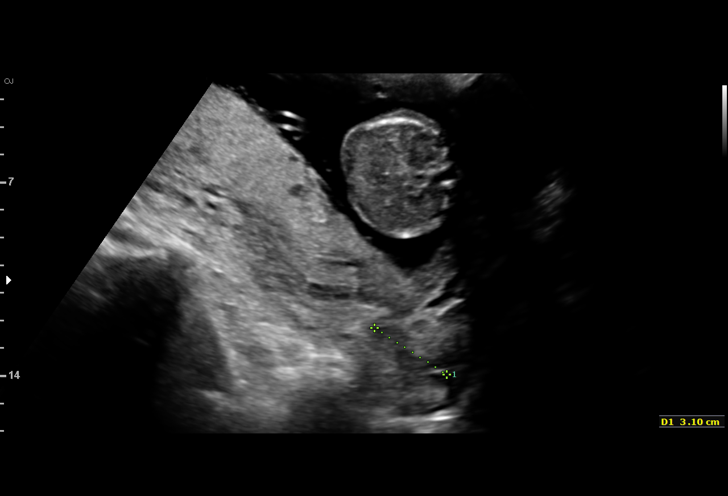
[im 5/19]
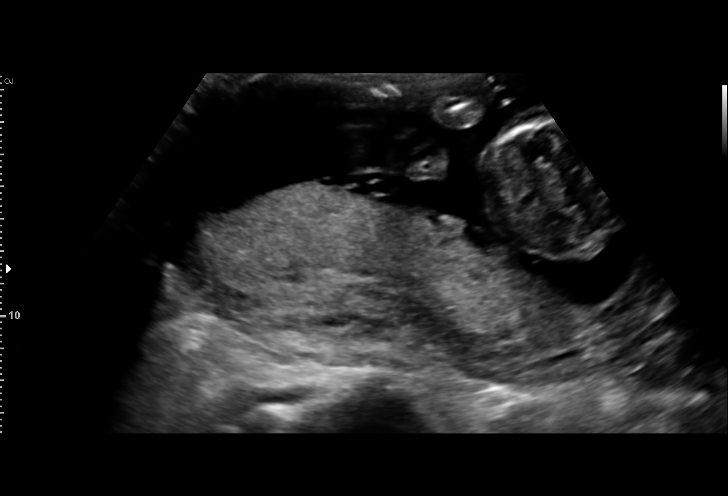
[im 6/19]
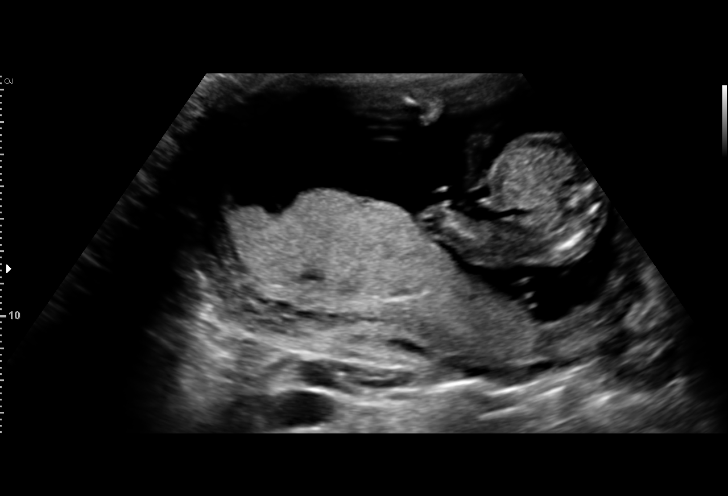
[im 7/19]
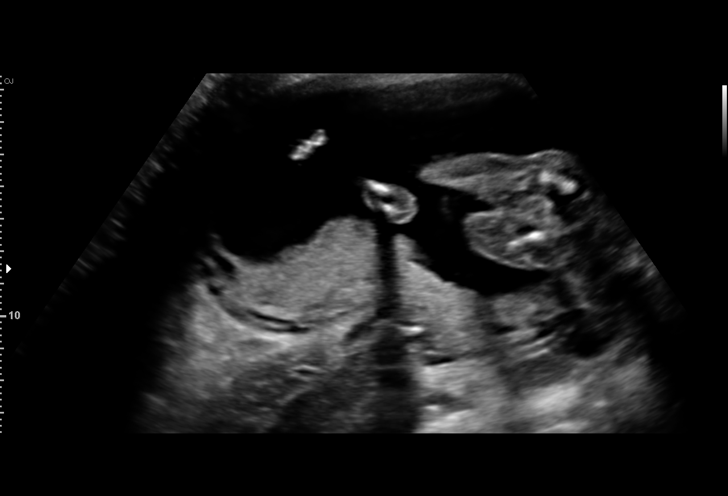
[im 9/19]
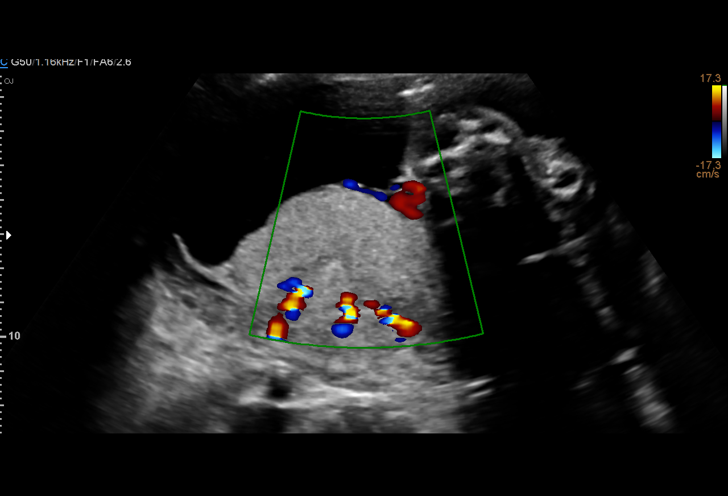
[im 10/19]
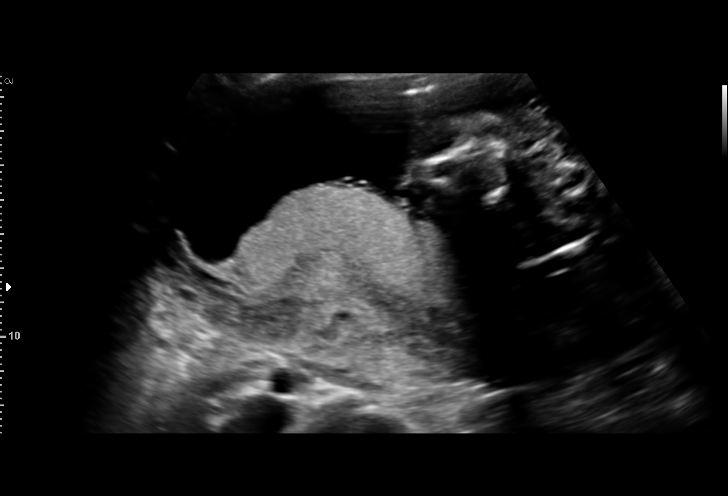
[im 11/19]
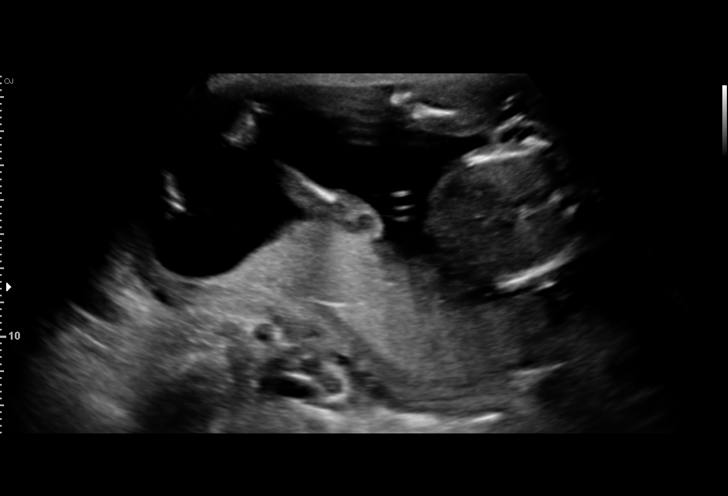
[im 13/19]
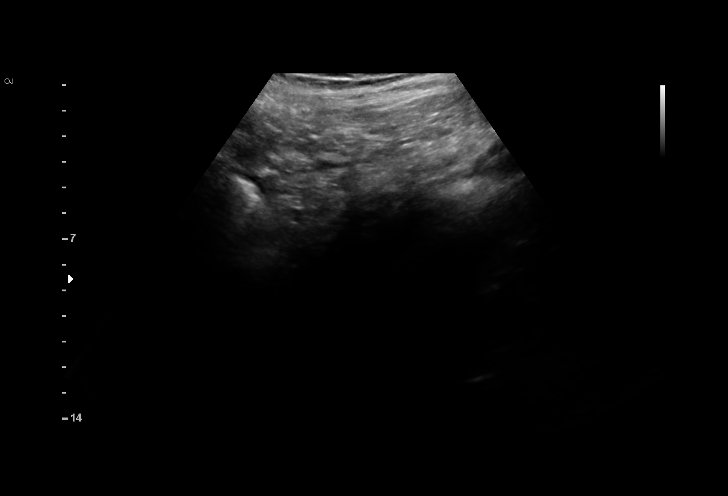
[im 14/19]
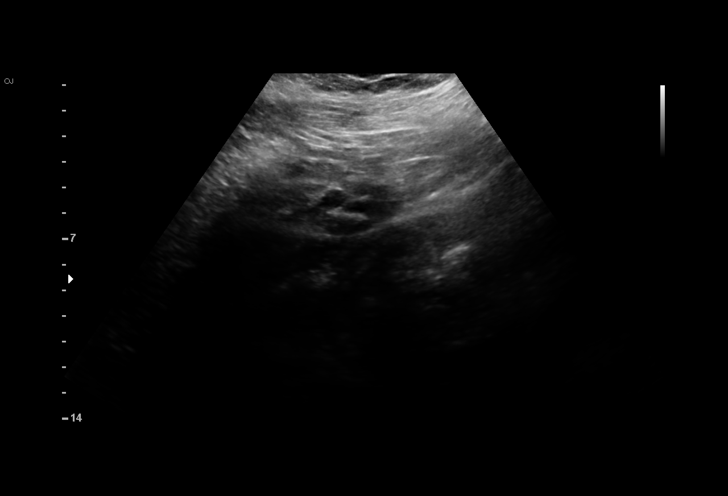
[im 15/19]
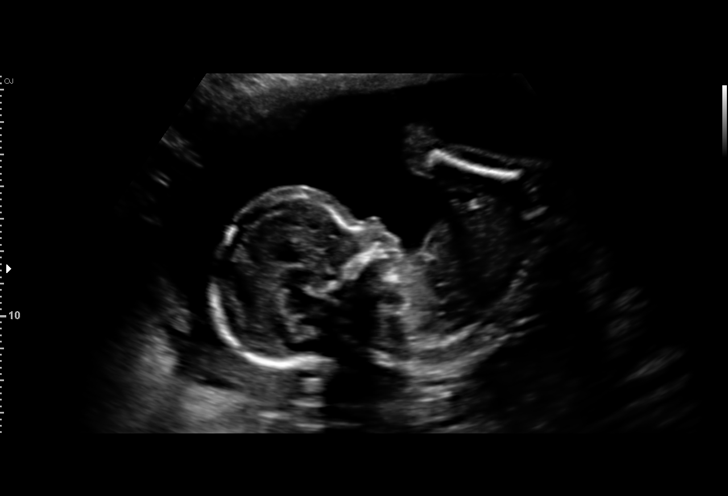
[im 16/19]
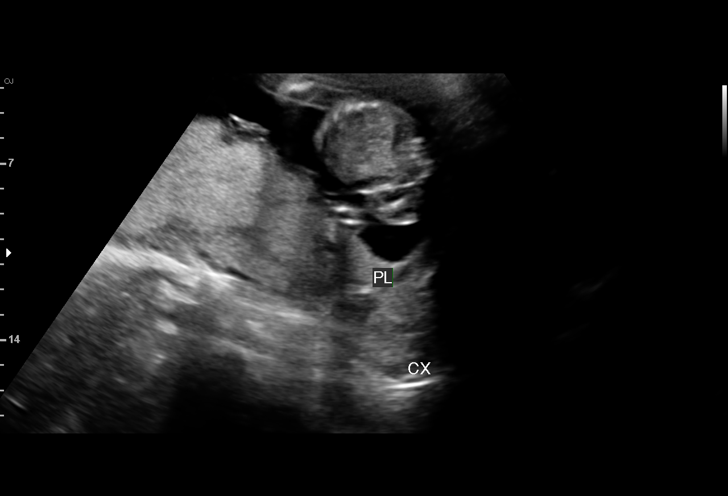
[im 18/19]
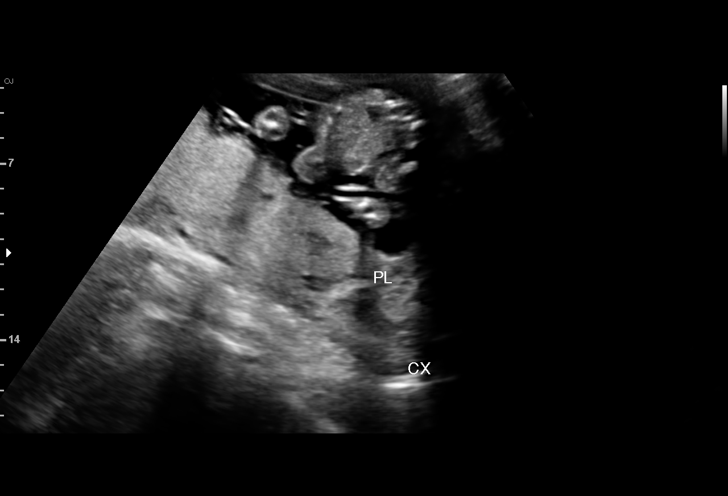
[im 19/19]
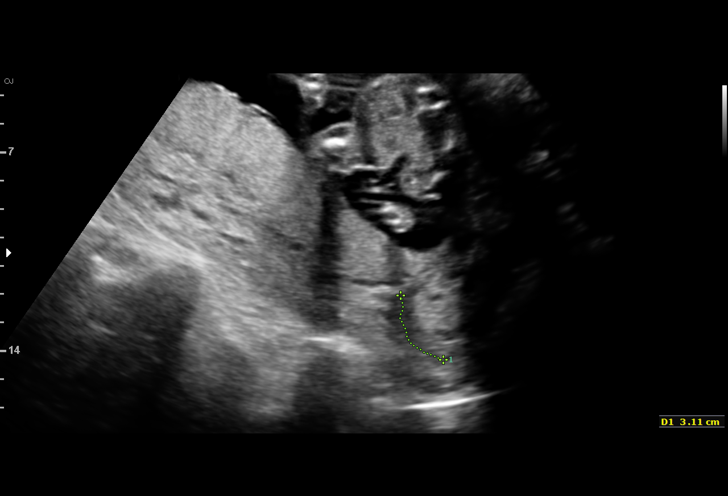

[15 of 19 positions shown; findings below may reference images not displayed]

Road [HOSPITAL]

Indications

19 weeks gestation of pregnancy
Vaginal bleeding in pregnancy, second
trimester
OB History

Gravidity:    4         Term:   0        Prem:   0        SAB:   2
TOP:          1       Ectopic:  0        Living: 0
Fetal Evaluation

Num Of Fetuses:     1
Fetal Heart         152
Rate(bpm):
Cardiac Activity:   Observed
Presentation:       Breech
Placenta:           Posterior Previa
P. Cord Insertion:  Visualized

Amniotic Fluid
AFI FV:      Subjectively within normal limits

Largest Pocket(cm)
6.6
Gestational Age

LMP:           20w 1d       Date:   08/16/15                 EDD:   05/22/16
Best:          19w 0d    Det. By:   Early Ultrasound         EDD:   05/30/16
(10/06/15)
Cervix Uterus Adnexa

Cervix
Length:            3.1  cm.
Normal appearance by transabdominal scan.

Uterus
No abnormality visualized.

Left Ovary
Not visualized. No adnexal mass visualized.

Right Ovary
Not visualized. No adnexal mass visualized.
Impression

SIUP at 19+0 weeks
Normal amniotic fluid volume
Asymmetric, complete placenta previa; no subchorionic fluid
collections/hemorrhage identified
Recommendations

Follow-up as clinically indicated

## 2017-06-06 IMAGING — US US MFM OB TRANSVAGINAL
1 series · 11 of 11 positions shown · non-contrast
Comparison: none

[Series 1: us mfm ob transvaginal · 11 acquisitions, 11 frames shown]
[im 1/11]
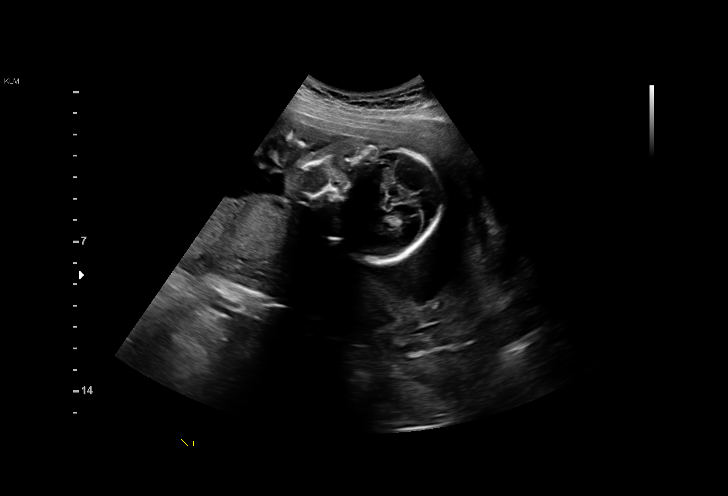
[im 2/11]
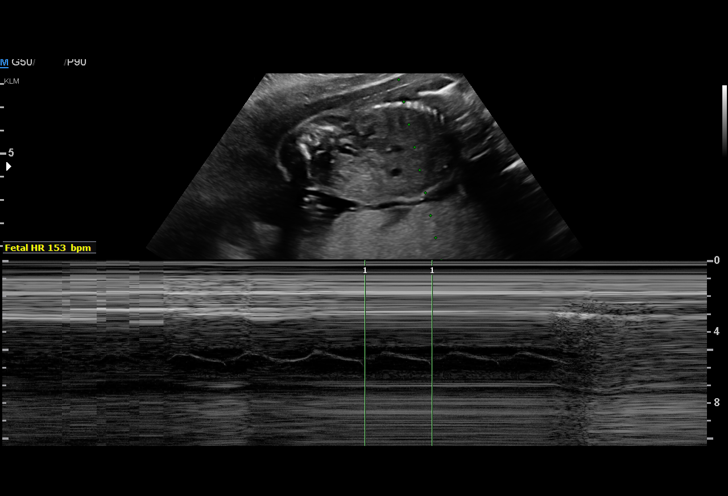
[im 3/11]
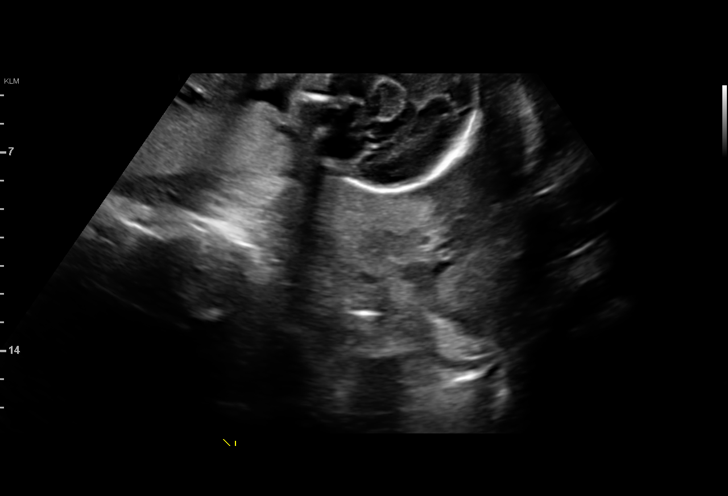
[im 4/11]
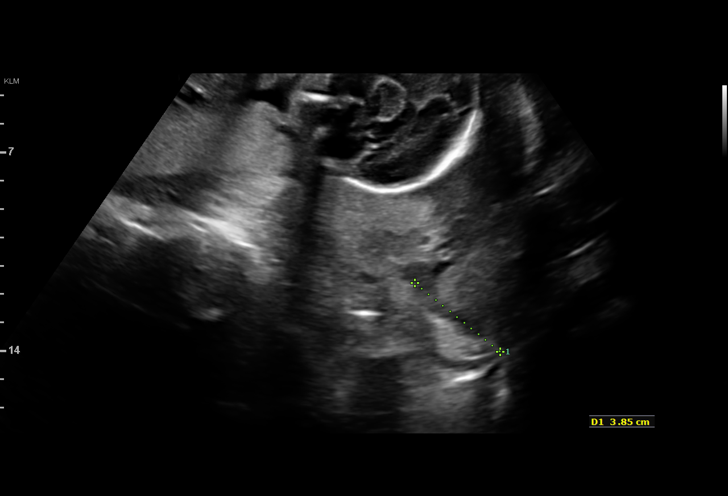
[im 5/11]
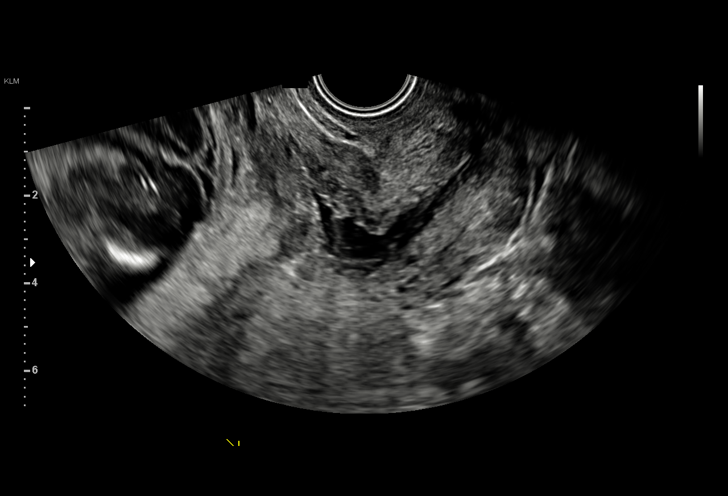
[im 6/11]
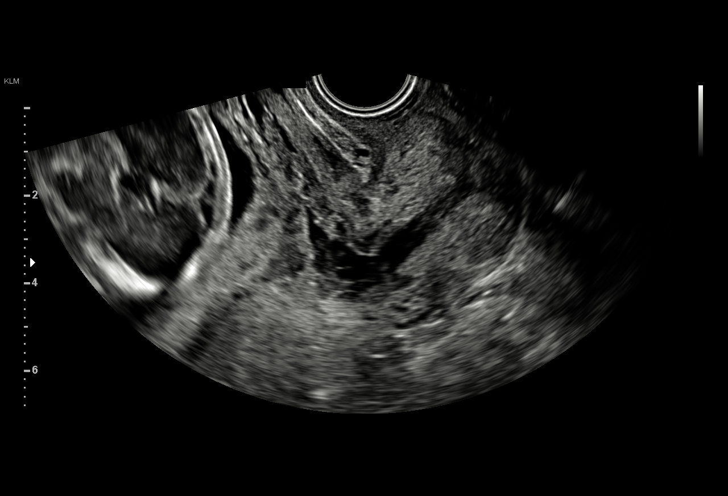
[im 7/11]
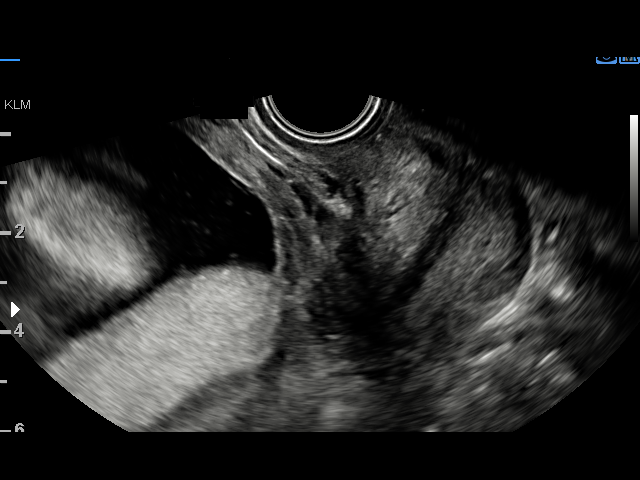
[im 8/11]
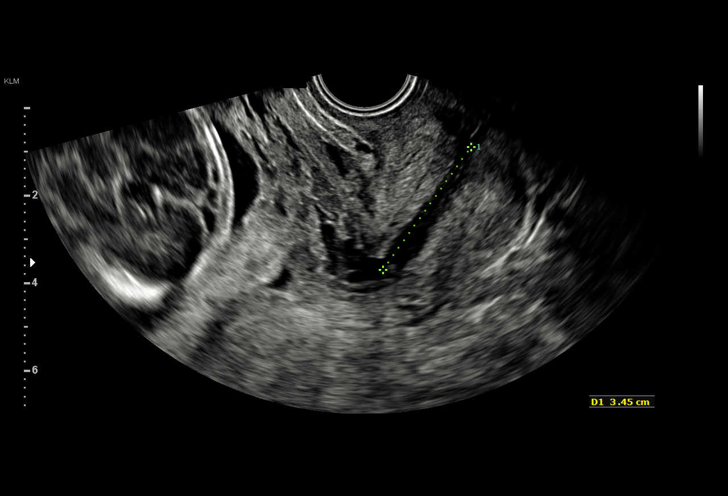
[im 9/11]
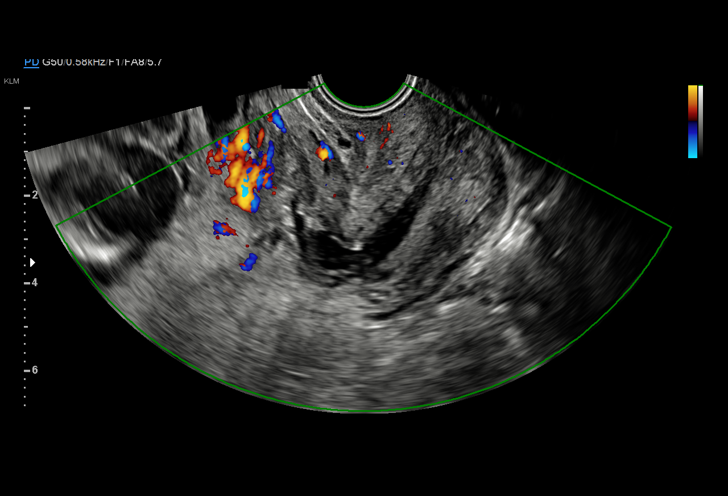
[im 10/11]
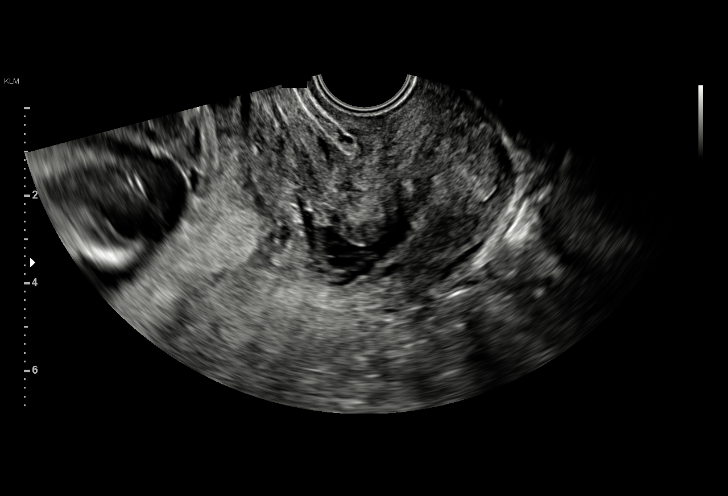
[im 11/11]
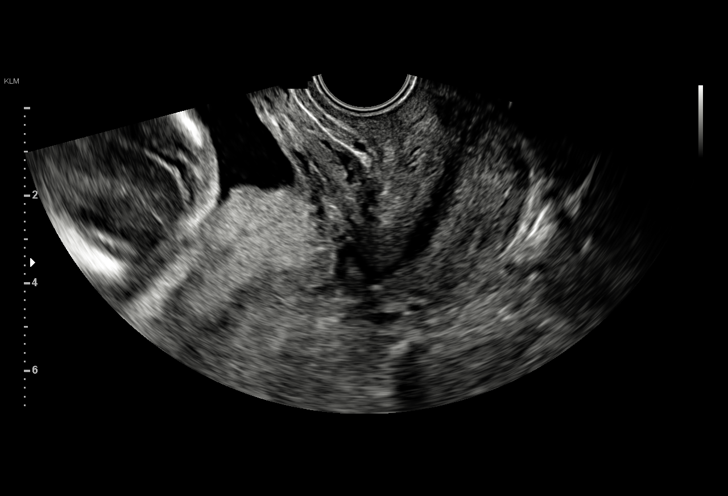

[11 of 11 positions shown; findings below may reference images not displayed]

pm)

Road [HOSPITAL]

1  KENNETH W BODDEN         [id number]      [id number]     [id number]
Indications

21 weeks gestation of pregnancy
Placenta previa with hemorrhage, second
trimester
OB History

Blood Type:            Height:  5'7"   Weight (lb):  160       BMI:
Gravidity:    4         Term:   0        Prem:   0        SAB:   2
TOP:          1       Ectopic:  0        Living: 0
Fetal Evaluation

Num Of Fetuses:     1
Fetal Heart         153
Rate(bpm):
Cardiac Activity:   Observed
Presentation:       Cephalic
Placenta:           Posterior Previa
Gestational Age

LMP:           22w 4d        Date:  08/16/15                 EDD:   05/22/16
Best:          21w 3d     Det. By:  Early Ultrasound         EDD:   05/30/16
(10/06/15)
Cervix Uterus Adnexa

Cervix
Length:           3.45  cm.
Normal appearance by transvaginal scan
Impression

Single IUP at 21w 3d
active singleton fetus
Limited ultrasound performed due to vaginal bleeding
Cervical length 3.45 cm without funneling, but the
endocervical canal is more dilated than typically seen at 6-7
mm in diameter
A complete, posteror placenta previa is noted with a thin (not
measurable amount of heterogenous debris that is directly
over the internal os and appears to drain into the
endocervical canal (ie, this is probably blood clot)
sonographer reports blood on the ultrasound probe
Recommendations

1. correlate with sterile speculum exam
2. given bleeding described, this patient is probably best
served by 5-7 days of inpatient observation depending on
your clinical judgement
3. I would consider rescanning by TVUS prior to discharge to
rule out evolution/onset of retroplacental hemorrhage
4. upon achievement of viable gestational age, I would
consider administration of antenatal corticosteroids and NICU
consult depending on patient desires for gestational age of
intervention (however, at this gestational age, the fetus is not
viable).

## 2017-08-22 IMAGING — US US MFM OB FOLLOW-UP
3 series · 13 of 28 positions shown · non-contrast
Comparison: none

[Series 1: us mfm ob follow-up · 49 acquisitions, 9 frames shown (1 of 3)]
[im 3/49]
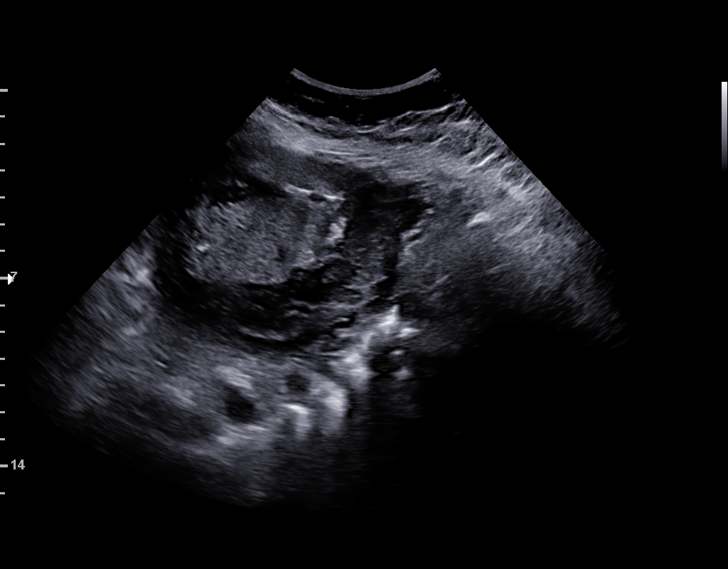
[im 8/49]
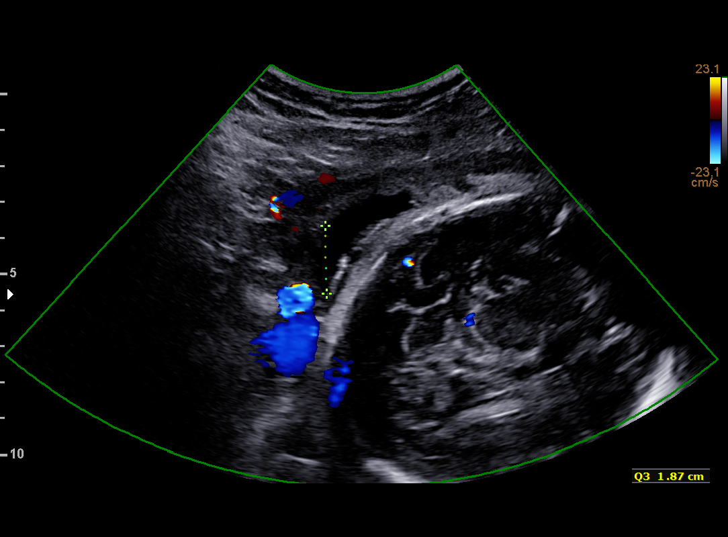
[im 13/49]
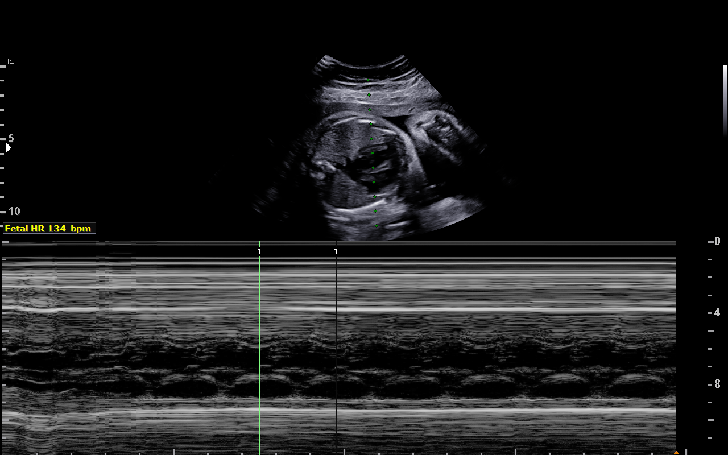
[im 18/49]
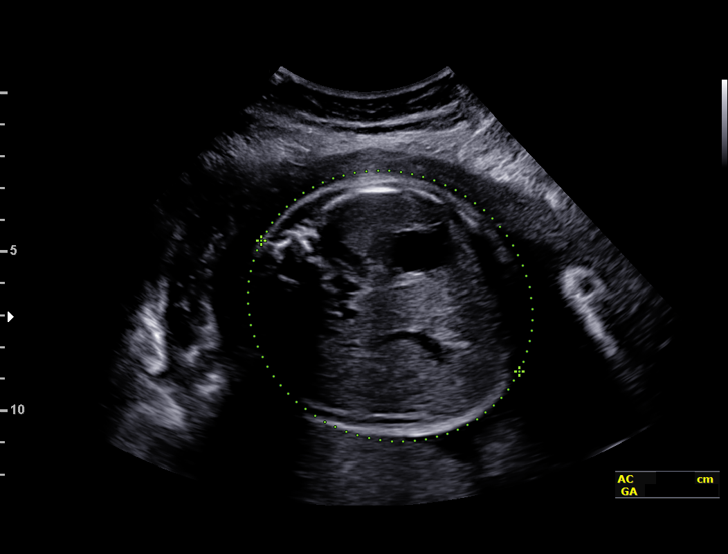
[im 23/49]
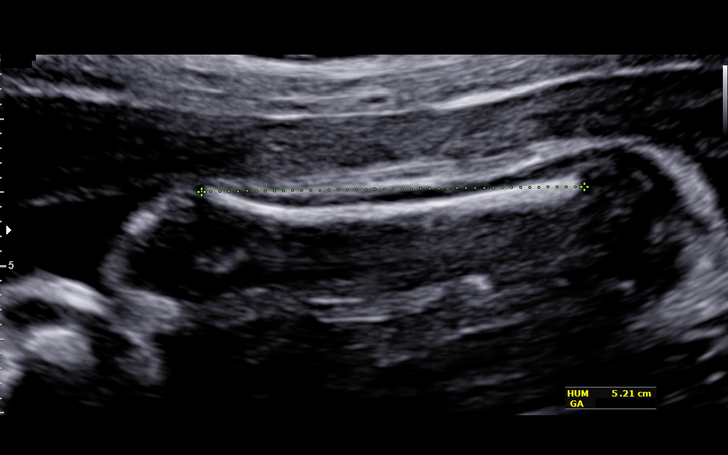
[im 28/49]
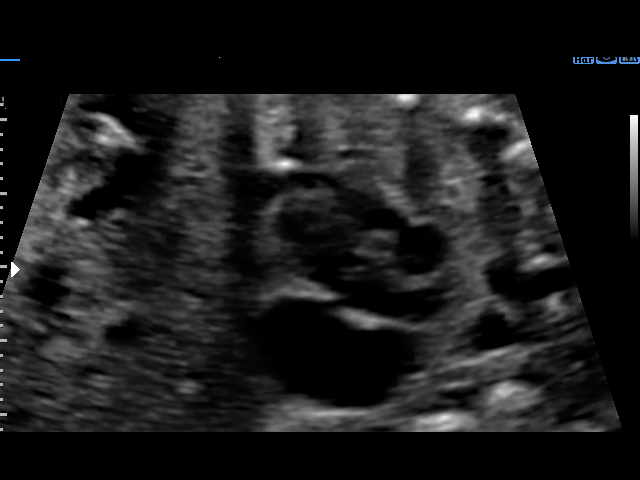
[im 36/49]
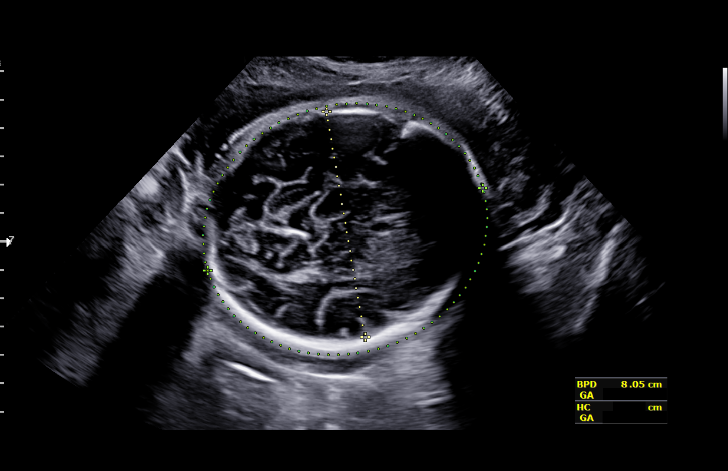
[im 41/49]
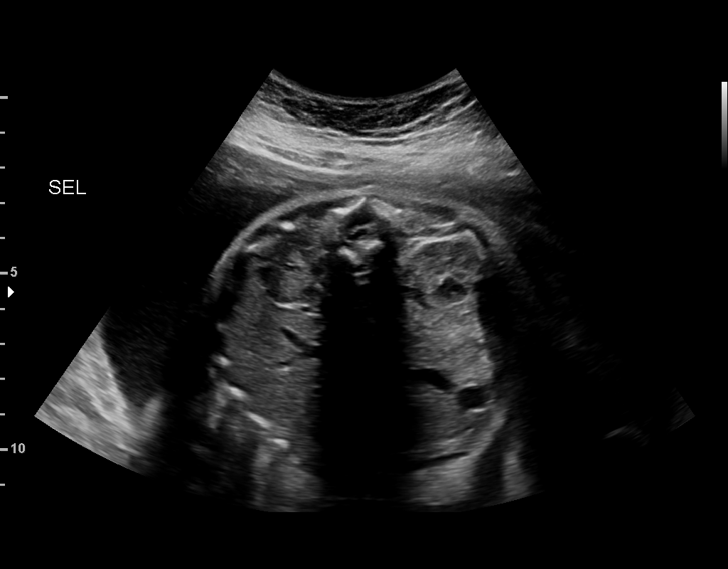
[im 46/49]
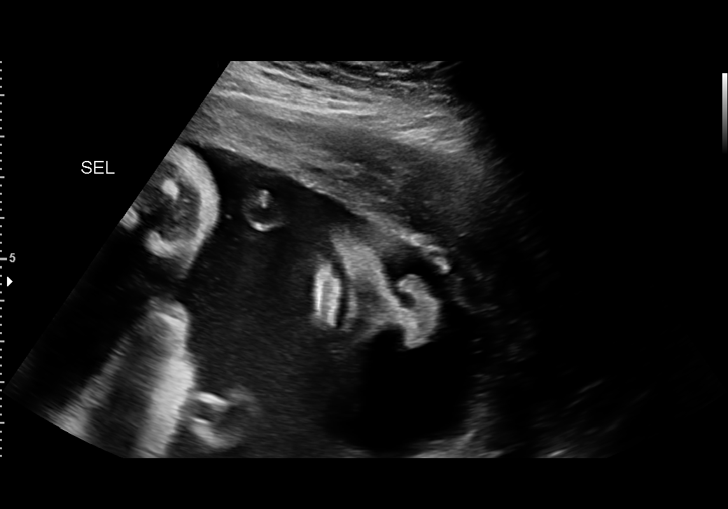

[Series 3: us mfm ob follow-up · 6 acquisitions, 2 frames shown (2 of 3)]
[im 1/6]
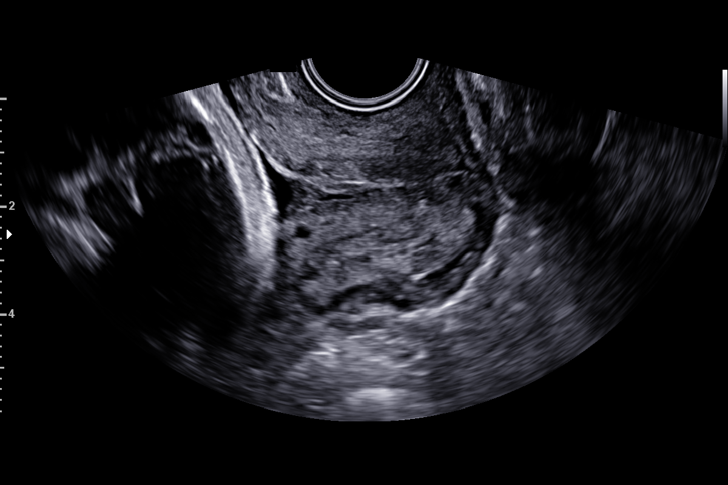
[im 6/6]
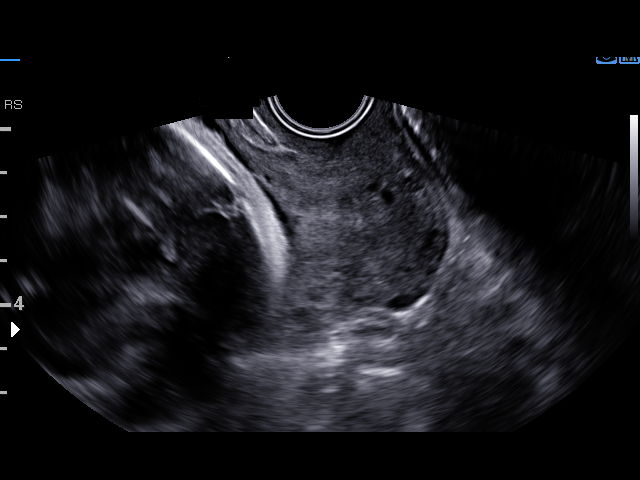

[Series 4: us mfm ob follow-up · 2 of 11 slices shown (3 of 3)]
[im 3/11]
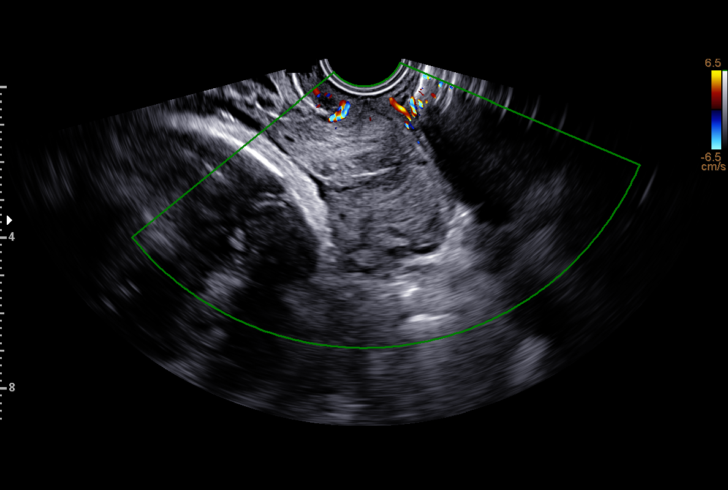
[im 8/11]
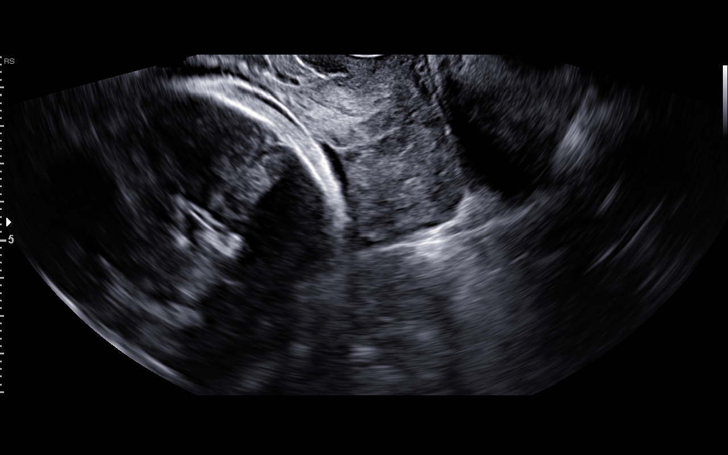

[13 of 28 positions shown; findings below may reference images not displayed]

Road [HOSPITAL]

1  RAJANAND PARDESI            [id number]      [id number]     [id number]
2  RAJANAND PARDESI            [id number]      [id number]     [id number]
Indications

32 weeks gestation of pregnancy
2 vessel umbilical cord
Other mental disorder complicating
pregnancy, third trimester (bipolar)
Placenta previa specified as without
hemorrhage, third trimester
OB History

Blood Type:            Height:  5'7"   Weight (lb):  160      BMI:
Gravidity:    4         Term:   0        Prem:   0        SAB:   2
TOP:          1       Ectopic:  0        Living: 0
Fetal Evaluation

Num Of Fetuses:     1
Fetal Heart         134
Rate(bpm):
Cardiac Activity:   Observed
Presentation:       Cephalic
Placenta:           Posterior, above cervical os

Amniotic Fluid
AFI FV:      Subjectively within normal limits

AFI Sum(cm)     %Tile       Largest Pocket(cm)
18.83           70

RUQ(cm)       RLQ(cm)       LUQ(cm)        LLQ(cm)
5.59
Biometry

BPD:      79.6  mm     G. Age:  32w 0d         28  %    CI:        72.69   %   70 - 86
FL/HC:      20.6   %   19.1 -
HC:      296.9  mm     G. Age:  32w 6d         24  %    HC/AC:      1.08       0.96 -
AC:       275   mm     G. Age:  31w 4d         25  %    FL/BPD:     76.9   %   71 - 87
FL:       61.2  mm     G. Age:  31w 5d         22  %    FL/AC:      22.3   %   20 - 24
HUM:      52.1  mm     G. Age:  30w 3d         13  %

Est. FW:    1424  gm      4 lb 1 oz     44  %
Gestational Age

LMP:           33w 4d       Date:   08/16/15                 EDD:   05/22/16
U/S Today:     32w 0d                                        EDD:   06/02/16
Best:          32w 3d    Det. By:   Early Ultrasound         EDD:   05/30/16
(10/06/15)
Anatomy

Cranium:               Appears normal         Aortic Arch:            Previously seen
Cavum:                 Previously seen        Ductal Arch:            Previously seen
Ventricles:            Appears normal         Diaphragm:              Previously seen
Choroid Plexus:        Previously seen        Stomach:                Appears normal, left
sided
Cerebellum:            Previously seen        Abdomen:                Appears normal
Posterior Fossa:       Previously seen        Abdominal Wall:         Previously seen
Nuchal Fold:           Previously seen        Cord Vessels:           2 vessel cord,
absent Szilaj Fegyver
Face:                  Orbits and profile     Kidneys:                Appear normal
previously seen
Lips:                  Previously seen        Bladder:                Appears normal
Thoracic:              Appears normal         Spine:                  Limited views
previously seen
Heart:                 Previously seen        Upper Extremities:      Previously seen
RVOT:                  Previously seen        Lower Extremities:      Previously seen
LVOT:                  Previously seen

Other:  Female gender previously visualized. Nasal bone previously
visualized. Heels and 5th digit previously visualized. Technically
difficult due to fetal position.
Cervix Uterus Adnexa

Cervix
Length:           3.41  cm.
Measured transvaginally.

Uterus
No abnormality visualized.

Left Ovary
Not visualized.

Right Ovary
Not visualized.

Adnexa:       No abnormality visualized. No adnexal mass
visualized.
Impression

SIUP at 32+3 weeks
Single umbilical artery
All other interval fetal anatomy was seen and appeared
normal; anatomic survey complete
Normal amniotic fluid volume
Appropriate interval growth with EFW at the 44th %tile
EV views of cervix: normal length without funneling; no previa
Posterior placenta above os
Recommendations

Follow-up ultrasound for growth in 4 weeks

## 2017-09-24 ENCOUNTER — Other Ambulatory Visit: Payer: Self-pay | Admitting: Certified Nurse Midwife

## 2017-09-24 ENCOUNTER — Telehealth: Payer: Self-pay

## 2017-09-24 NOTE — Telephone Encounter (Signed)
Pt called requesting refill for flagyl to treat her trichomonas infections. She states that her partner did not get treated, and she believes that she has contracted the infection again. Pt advised to schedule appt to be seen. Pt states that she did not want to come back to our office, and that she would [facility].

## 2017-09-24 NOTE — Telephone Encounter (Signed)
Please review for refill.  

## 2017-10-10 ENCOUNTER — Other Ambulatory Visit: Payer: Self-pay | Admitting: Certified Nurse Midwife

## 2017-11-14 ENCOUNTER — Inpatient Hospital Stay (HOSPITAL_COMMUNITY)
Admission: AD | Admit: 2017-11-14 | Discharge: 2017-11-14 | Disposition: A | Discharge status: Home / Self Care | Payer: Self-pay | Source: Ambulatory Visit | Attending: Obstetrics and Gynecology | Admitting: Obstetrics and Gynecology

## 2017-11-14 ENCOUNTER — Inpatient Hospital Stay (HOSPITAL_COMMUNITY): Payer: Self-pay

## 2017-11-14 ENCOUNTER — Encounter (HOSPITAL_COMMUNITY): Payer: Self-pay | Admitting: *Deleted

## 2017-11-14 DIAGNOSIS — O26891 Other specified pregnancy related conditions, first trimester: Secondary | ICD-10-CM

## 2017-11-14 DIAGNOSIS — Z3491 Encounter for supervision of normal pregnancy, unspecified, first trimester: Secondary | ICD-10-CM

## 2017-11-14 DIAGNOSIS — Z3A01 Less than 8 weeks gestation of pregnancy: Secondary | ICD-10-CM

## 2017-11-14 DIAGNOSIS — O26899 Other specified pregnancy related conditions, unspecified trimester: Secondary | ICD-10-CM

## 2017-11-14 DIAGNOSIS — R109 Unspecified abdominal pain: Secondary | ICD-10-CM

## 2017-11-14 DIAGNOSIS — Z87891 Personal history of nicotine dependence: Secondary | ICD-10-CM | POA: Insufficient documentation

## 2017-11-14 LAB — CBC
HEMATOCRIT: 40.2 % (ref 36.0–46.0)
Hemoglobin: 13.5 g/dL (ref 12.0–15.0)
MCH: 30.2 pg (ref 26.0–34.0)
MCHC: 33.6 g/dL (ref 30.0–36.0)
MCV: 89.9 fL (ref 78.0–100.0)
Platelets: 172 10*3/uL (ref 150–400)
RBC: 4.47 MIL/uL (ref 3.87–5.11)
RDW: 13.1 % (ref 11.5–15.5)
WBC: 9.4 10*3/uL (ref 4.0–10.5)

## 2017-11-14 LAB — URINALYSIS, ROUTINE W REFLEX MICROSCOPIC
Bilirubin Urine: NEGATIVE
Glucose, UA: NEGATIVE mg/dL
HGB URINE DIPSTICK: NEGATIVE
KETONES UR: NEGATIVE mg/dL
LEUKOCYTES UA: NEGATIVE
Nitrite: NEGATIVE
PROTEIN: NEGATIVE mg/dL
Specific Gravity, Urine: 1.019 (ref 1.005–1.030)
pH: 7 (ref 5.0–8.0)

## 2017-11-14 LAB — WET PREP, GENITAL
CLUE CELLS WET PREP: NONE SEEN
SPERM: NONE SEEN
Trich, Wet Prep: NONE SEEN
YEAST WET PREP: NONE SEEN

## 2017-11-14 LAB — POCT PREGNANCY, URINE: PREG TEST UR: POSITIVE — AB

## 2017-11-14 LAB — HCG, QUANTITATIVE, PREGNANCY: hCG, Beta Chain, Quant, S: 32942 m[IU]/mL — ABNORMAL HIGH (ref <5)

## 2017-11-14 MED ORDER — PRENATAL 27-0.8 MG PO TABS: 1.0000 | ORAL_TABLET | Freq: Every day | ORAL | 9 refills | Status: DC

## 2017-11-14 MED ORDER — PROMETHAZINE HCL 25 MG PO TABS: 25.0000 mg | ORAL_TABLET | Freq: Four times a day (QID) | ORAL | 0 refills | Status: DC | PRN

## 2017-11-14 NOTE — MAU Note (Signed)
Pt presents with complaint of lower abd cramping, positive home preg test last week, no bleeding.

## 2017-11-14 NOTE — Discharge Instructions (Signed)
[facility]  ° ° °[facility]       Phone: [phone] ° °[facility] Phone: [phone] ° °[facility] [place]  Phone: [phone] ° °[facility] [place] [facility]  Phone: [phone] ° °[facility]  Phone: [phone] ° °[facility]       Phone: [phone] ° °[facility]    Phone: [phone]  ° °[facility]    Phone: [phone] ° °[facility]    Phone: [phone] ° °[facility]    Phone: [phone] ° °[facility]    Phone: [phone] ° °[facility]       Phone: [phone]  ° °[facility]  Phone: [phone] ° °[facility]    Phone: [phone] ° °[facility]   Phone: [phone] ° °[facility]      Phone: [phone] ° °[facility]    Phone: [phone] ° °Safe Medications in Pregnancy  ° °Acne: °Benzoyl Peroxide °Salicylic Acid ° °Backache/Headache: °Tylenol: 2 regular strength every 4 hours OR °             2 Extra strength every 6 hours ° °Colds/Coughs/Allergies: °Benadryl (alcohol free) 25 mg every 6 hours as needed °Breath right strips °Claritin °Cepacol throat lozenges °Chloraseptic throat spray °Cold-Eeze- up to three times per day °Cough drops, alcohol free °Flonase (by prescription only) °Guaifenesin °Mucinex °Robitussin DM (plain only, alcohol free) °Saline nasal spray/drops °Sudafed (pseudoephedrine) & Actifed ** use only after [redacted] weeks gestation and if you do not have high blood pressure °Tylenol °Vicks Vaporub °Zinc lozenges °Zyrtec  ° °Constipation: °Colace °Ducolax suppositories °Fleet enema °Glycerin suppositories °Metamucil °Milk of  magnesia °Miralax °Senokot °Smooth move tea ° °Diarrhea: °Kaopectate °Imodium A-D ° °*NO pepto Bismol ° °Hemorrhoids: °Anusol °Anusol HC °Preparation H °Tucks ° °Indigestion: °Tums °Maalox °Mylanta °Zantac  °Pepcid ° °Insomnia: °Benadryl (alcohol free) 25mg every 6 hours as needed °Tylenol PM °Unisom, no Gelcaps ° °Leg Cramps: °Tums °MagGel ° °Nausea/Vomiting:  °Bonine °Dramamine °Emetrol °Ginger extract °Sea bands °Meclizine  °Nausea medication to take during pregnancy:  °Unisom (doxylamine succinate 25 mg tablets) Take one tablet daily at bedtime. If symptoms are not adequately controlled, the dose can be increased to a maximum recommended dose of two tablets daily (1/2 tablet in the morning, 1/2 tablet mid-afternoon and one at bedtime). °Vitamin B6 100mg tablets. Take one tablet twice a day (up to 200 mg per day). ° °Skin Rashes: °Aveeno products °Benadryl cream or 25mg every 6 hours as needed °Calamine Lotion °1% cortisone cream ° °Yeast infection: °Gyne-lotrimin 7 °Monistat 7 ° ° °**If taking multiple medications, please check labels to avoid duplicating the same active ingredients °**take medication as directed on the label °** Do not exceed 4000 mg of tylenol in 24 hours °**Do not take medications that contain aspirin or ibuprofen ° ° ° ° °First Trimester of Pregnancy °The first trimester of pregnancy is from week 1 until the end of week 13 (months 1 through 3). A week after a sperm fertilizes an egg, the egg will implant on the wall of the uterus. This embryo will begin to develop into a baby. Genes from you and your partner will form the baby. The female genes will determine whether the baby will be a boy or a girl. At 6-8   weeks, the eyes and face will be formed, and the heartbeat can be seen on ultrasound. At the end of 12 weeks, all the baby's organs will be formed. °Now that you are pregnant, you will want to do everything you can to have a healthy baby. Two of the most important things are to get good  prenatal care and to follow your health care provider's instructions. Prenatal care is all the medical care you receive before the baby's birth. This care will help prevent, find, and treat any problems during the pregnancy and childbirth. °Body changes during your first trimester °Your body goes through many changes during pregnancy. The changes vary from woman to woman. °· You may gain or lose a couple of pounds at first. °· You may feel sick to your stomach (nauseous) and you may throw up (vomit). If the vomiting is uncontrollable, call your health care provider. °· You may tire easily. °· You may develop headaches that can be relieved by medicines. All medicines should be approved by your health care provider. °· You may urinate more often. Painful urination may mean you have a bladder infection. °· You may develop heartburn as a result of your pregnancy. °· You may develop constipation because certain hormones are causing the muscles that push stool through your intestines to slow down. °· You may develop hemorrhoids or swollen veins (varicose veins). °· Your breasts may begin to grow larger and become tender. Your nipples may stick out more, and the tissue that surrounds them (areola) may become darker. °· Your gums may bleed and may be sensitive to brushing and flossing. °· Dark spots or blotches (chloasma, mask of pregnancy) may develop on your face. This will likely fade after the baby is born. °· Your menstrual periods will stop. °· You may have a loss of appetite. °· You may develop cravings for certain kinds of food. °· You may have changes in your emotions from day to day, such as being excited to be pregnant or being concerned that something may go wrong with the pregnancy and baby. °· You may have more vivid and strange dreams. °· You may have changes in your hair. These can include thickening of your hair, rapid growth, and changes in texture. Some women also have hair loss during or after pregnancy,  or hair that feels dry or thin. Your hair will most likely return to normal after your baby is born. ° °What to expect at prenatal visits °During a routine prenatal visit: °· You will be weighed to make sure you and the baby are growing normally. °· Your blood pressure will be taken. °· Your abdomen will be measured to track your baby's growth. °· The fetal heartbeat will be listened to between weeks 10 and 14 of your pregnancy. °· Test results from any previous visits will be discussed. ° °Your health care provider may ask you: °· How you are feeling. °· If you are feeling the baby move. °· If you have had any abnormal symptoms, such as leaking fluid, bleeding, severe headaches, or abdominal cramping. °· If you are using any tobacco products, including cigarettes, chewing tobacco, and electronic cigarettes. °· If you have any questions. ° °Other tests that may be performed during your first trimester include: °· Blood tests to find your blood type and to check for the presence of any previous infections. The tests will also be used to check for low iron levels (anemia) and protein on red blood cells (Rh antibodies). Depending   on your risk factors, or if you previously had diabetes during pregnancy, you may have tests to check for high blood sugar that affects pregnant women (gestational diabetes). °· Urine tests to check for infections, diabetes, or protein in the urine. °· An ultrasound to confirm the proper growth and development of the baby. °· Fetal screens for spinal cord problems (spina bifida) and Down syndrome. °· HIV (human immunodeficiency virus) testing. Routine prenatal testing includes screening for HIV, unless you choose not to have this test. °· You may need other tests to make sure you and the baby are doing well. ° °Follow these instructions at home: °Medicines °· Follow your health care provider's instructions regarding medicine use. Specific medicines may be either safe or unsafe to take during  pregnancy. °· Take a prenatal vitamin that contains at least 600 micrograms (mcg) of folic acid. °· If you develop constipation, try taking a stool softener if your health care provider approves. °Eating and drinking °· Eat a balanced diet that includes fresh fruits and vegetables, whole grains, good sources of protein such as meat, eggs, or tofu, and low-fat dairy. Your health care provider will help you determine the amount of weight gain that is right for you. °· Avoid raw meat and uncooked cheese. These carry germs that can cause birth defects in the baby. °· Eating four or five small meals rather than three large meals a day may help relieve nausea and vomiting. If you start to feel nauseous, eating a few soda crackers can be helpful. Drinking liquids between meals, instead of during meals, also seems to help ease nausea and vomiting. °· Limit foods that are high in fat and processed sugars, such as fried and sweet foods. °· To prevent constipation: °? Eat foods that are high in fiber, such as fresh fruits and vegetables, whole grains, and beans. °? Drink enough fluid to keep your urine clear or pale yellow. °Activity °· Exercise only as directed by your health care provider. Most women can continue their usual exercise routine during pregnancy. Try to exercise for 30 minutes at least 5 days a week. Exercising will help you: °? Control your weight. °? Stay in shape. °? Be prepared for labor and delivery. °· Experiencing pain or cramping in the lower abdomen or lower back is a good sign that you should stop exercising. Check with your health care provider before continuing with normal exercises. °· Try to avoid standing for long periods of time. Move your legs often if you must stand in one place for a long time. °· Avoid heavy lifting. °· Wear low-heeled shoes and practice good posture. °· You may continue to have sex unless your health care provider tells you not to. °Relieving pain and discomfort °· Wear a  good support bra to relieve breast tenderness. °· Take warm sitz baths to soothe any pain or discomfort caused by hemorrhoids. Use hemorrhoid cream if your health care provider approves. °· Rest with your legs elevated if you have leg cramps or low back pain. °· If you develop varicose veins in your legs, wear support hose. Elevate your feet for 15 minutes, 3-4 times a day. Limit salt in your diet. °Prenatal care °· Schedule your prenatal visits by the twelfth week of pregnancy. They are usually scheduled monthly at first, then more often in the last 2 months before delivery. °· Write down your questions. Take them to your prenatal visits. °· Keep all your prenatal visits as told by your health care   provider. This is important. °Safety °· Wear your seat belt at all times when driving. °· Make a list of emergency phone numbers, including numbers for family, friends, the hospital, and police and fire departments. °General instructions °· Ask your health care provider for a referral to a local prenatal education class. Begin classes no later than the beginning of month 6 of your pregnancy. °· Ask for help if you have counseling or nutritional needs during pregnancy. Your health care provider can offer advice or refer you to specialists for help with various needs. °· Do not use hot tubs, steam rooms, or saunas. °· Do not douche or use tampons or scented sanitary pads. °· Do not cross your legs for long periods of time. °· Avoid cat litter boxes and soil used by cats. These carry germs that can cause birth defects in the baby and possibly loss of the fetus by miscarriage or stillbirth. °· Avoid all smoking, herbs, alcohol, and medicines not prescribed by your health care provider. Chemicals in these products affect the formation and growth of the baby. °· Do not use any products that contain nicotine or tobacco, such as cigarettes and e-cigarettes. If you need help quitting, ask your health care provider. You may  receive counseling support and other resources to help you quit. °· Schedule a dentist appointment. At home, brush your teeth with a soft toothbrush and be gentle when you floss. °Contact a health care provider if: °· You have dizziness. °· You have mild pelvic cramps, pelvic pressure, or nagging pain in the abdominal area. °· You have persistent nausea, vomiting, or diarrhea. °· You have a bad smelling vaginal discharge. °· You have pain when you urinate. °· You notice increased swelling in your face, hands, legs, or ankles. °· You are exposed to fifth disease or chickenpox. °· You are exposed to German measles (rubella) and have never had it. °Get help right away if: °· You have a fever. °· You are leaking fluid from your vagina. °· You have spotting or bleeding from your vagina. °· You have severe abdominal cramping or pain. °· You have rapid weight gain or loss. °· You vomit blood or material that looks like coffee grounds. °· You develop a severe headache. °· You have shortness of breath. °· You have any kind of trauma, such as from a fall or a car accident. °Summary °· The first trimester of pregnancy is from week 1 until the end of week 13 (months 1 through 3). °· Your body goes through many changes during pregnancy. The changes vary from woman to woman. °· You will have routine prenatal visits. During those visits, your health care provider will examine you, discuss any test results you may have, and talk with you about how you are feeling. °This information is not intended to replace advice given to you by your health care provider. Make sure you discuss any questions you have with your health care provider. °Document Released: 03/07/2001 Document Revised: 02/23/2016 Document Reviewed: 02/23/2016 °Elsevier Interactive Patient Education © 2018 [employer] ° °
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
## 2017-11-15 LAB — GC/CHLAMYDIA PROBE AMP (~~LOC~~) NOT AT ARMC
CHLAMYDIA, DNA PROBE: NEGATIVE
Neisseria Gonorrhea: NEGATIVE

## 2017-12-13 ENCOUNTER — Inpatient Hospital Stay (HOSPITAL_COMMUNITY)
Admission: AD | Admit: 2017-12-13 | Discharge: 2017-12-13 | Disposition: A | Discharge status: Home / Self Care | Payer: Medicaid Other | Source: Ambulatory Visit | Attending: Obstetrics & Gynecology | Admitting: Obstetrics & Gynecology

## 2017-12-13 ENCOUNTER — Other Ambulatory Visit: Payer: Self-pay

## 2017-12-13 ENCOUNTER — Encounter (HOSPITAL_COMMUNITY): Payer: Self-pay

## 2017-12-13 DIAGNOSIS — O219 Vomiting of pregnancy, unspecified: Secondary | ICD-10-CM

## 2017-12-13 DIAGNOSIS — Z202 Contact with and (suspected) exposure to infections with a predominantly sexual mode of transmission: Secondary | ICD-10-CM | POA: Insufficient documentation

## 2017-12-13 DIAGNOSIS — R059 Cough, unspecified: Secondary | ICD-10-CM

## 2017-12-13 DIAGNOSIS — R197 Diarrhea, unspecified: Secondary | ICD-10-CM | POA: Insufficient documentation

## 2017-12-13 DIAGNOSIS — O210 Mild hyperemesis gravidarum: Secondary | ICD-10-CM | POA: Insufficient documentation

## 2017-12-13 DIAGNOSIS — J45909 Unspecified asthma, uncomplicated: Secondary | ICD-10-CM

## 2017-12-13 DIAGNOSIS — O98811 Other maternal infectious and parasitic diseases complicating pregnancy, first trimester: Secondary | ICD-10-CM | POA: Insufficient documentation

## 2017-12-13 DIAGNOSIS — Z3A10 10 weeks gestation of pregnancy: Secondary | ICD-10-CM | POA: Insufficient documentation

## 2017-12-13 DIAGNOSIS — R05 Cough: Secondary | ICD-10-CM | POA: Insufficient documentation

## 2017-12-13 DIAGNOSIS — B373 Candidiasis of vulva and vagina: Secondary | ICD-10-CM | POA: Insufficient documentation

## 2017-12-13 DIAGNOSIS — O26891 Other specified pregnancy related conditions, first trimester: Secondary | ICD-10-CM | POA: Insufficient documentation

## 2017-12-13 DIAGNOSIS — Z87891 Personal history of nicotine dependence: Secondary | ICD-10-CM | POA: Insufficient documentation

## 2017-12-13 LAB — WET PREP, GENITAL
Clue Cells Wet Prep HPF POC: NONE SEEN
SPERM: NONE SEEN
Trich, Wet Prep: NONE SEEN

## 2017-12-13 LAB — URINALYSIS, ROUTINE W REFLEX MICROSCOPIC
Bilirubin Urine: NEGATIVE
Glucose, UA: NEGATIVE mg/dL
HGB URINE DIPSTICK: NEGATIVE
Ketones, ur: NEGATIVE mg/dL
NITRITE: NEGATIVE
Protein, ur: NEGATIVE mg/dL
SPECIFIC GRAVITY, URINE: 1.014 (ref 1.005–1.030)
pH: 7 (ref 5.0–8.0)

## 2017-12-13 MED ORDER — SALINE SPRAY 0.65 % NA SOLN: 1.0000 | NASAL | 0 refills | Status: DC | PRN

## 2017-12-13 MED ORDER — MENTHOL 5.8 MG MT LOZG: 1.0000 | LOZENGE | OROMUCOSAL | 0 refills | Status: DC | PRN

## 2017-12-13 MED ORDER — TERCONAZOLE 0.4 % VA CREA: 1.0000 | TOPICAL_CREAM | Freq: Every day | VAGINAL | 0 refills | Status: DC

## 2017-12-13 MED ORDER — LOPERAMIDE HCL 2 MG PO CAPS: 2.0000 mg | ORAL_CAPSULE | Freq: Four times a day (QID) | ORAL | 0 refills | Status: DC | PRN
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
## 2017-12-13 NOTE — Discharge Instructions (Signed)

## 2017-12-13 NOTE — MAU Note (Signed)
Pt presents to MAU with c/o cough, wheezing that started on Monday 9/16 and fever up to 100.5 that started yesterday. She states that she has also had diarrhea x 3 days. Pt voices concerns about STD's due to white discharge and itching. Pt denies VB.

## 2017-12-14 LAB — GC/CHLAMYDIA PROBE AMP (~~LOC~~) NOT AT ARMC
Chlamydia: NEGATIVE
NEISSERIA GONORRHEA: NEGATIVE

## 2017-12-26 ENCOUNTER — Telehealth: Payer: Self-pay | Admitting: Family Medicine

## 2017-12-26 NOTE — Telephone Encounter (Signed)
Patient was seen in the MAU for issues she was having. When asked by the provider why she had not started her care yet. She stated because she did not want to go back to the office she was seen at last time. The provider told her she could be seen here because the same doctors work in this office as well. After speaking with the patient, she informed me her reason for not wanting to go back to [facility] is the front office staff is rude, and have nasty attitudes. She said they judge you too, and I just don't want to go back there. I apologized for the behavior, and got her scheduled in this office.

## 2018-01-08 ENCOUNTER — Encounter (HOSPITAL_COMMUNITY): Payer: Self-pay

## 2018-01-08 ENCOUNTER — Other Ambulatory Visit: Payer: Self-pay

## 2018-01-08 ENCOUNTER — Inpatient Hospital Stay (HOSPITAL_COMMUNITY)
Admission: AD | Admit: 2018-01-08 | Discharge: 2018-01-08 | Disposition: A | Discharge status: Home / Self Care | Payer: No Typology Code available for payment source | Source: Ambulatory Visit | Attending: Obstetrics and Gynecology | Admitting: Obstetrics and Gynecology

## 2018-01-08 ENCOUNTER — Inpatient Hospital Stay (HOSPITAL_BASED_OUTPATIENT_CLINIC_OR_DEPARTMENT_OTHER): Payer: No Typology Code available for payment source

## 2018-01-08 ENCOUNTER — Inpatient Hospital Stay (HOSPITAL_COMMUNITY): Payer: No Typology Code available for payment source

## 2018-01-08 DIAGNOSIS — Y92414 Local residential or business street as the place of occurrence of the external cause: Secondary | ICD-10-CM | POA: Insufficient documentation

## 2018-01-08 DIAGNOSIS — Z3A14 14 weeks gestation of pregnancy: Secondary | ICD-10-CM

## 2018-01-08 DIAGNOSIS — R1032 Left lower quadrant pain: Secondary | ICD-10-CM | POA: Diagnosis not present

## 2018-01-08 DIAGNOSIS — Z87891 Personal history of nicotine dependence: Secondary | ICD-10-CM

## 2018-01-08 DIAGNOSIS — O26892 Other specified pregnancy related conditions, second trimester: Secondary | ICD-10-CM

## 2018-01-08 DIAGNOSIS — O26899 Other specified pregnancy related conditions, unspecified trimester: Secondary | ICD-10-CM

## 2018-01-08 DIAGNOSIS — O9A212 Injury, poisoning and certain other consequences of external causes complicating pregnancy, second trimester: Secondary | ICD-10-CM

## 2018-01-08 DIAGNOSIS — R109 Unspecified abdominal pain: Secondary | ICD-10-CM

## 2018-01-08 DIAGNOSIS — O26891 Other specified pregnancy related conditions, first trimester: Secondary | ICD-10-CM | POA: Diagnosis not present

## 2018-01-08 DIAGNOSIS — Z3A08 8 weeks gestation of pregnancy: Secondary | ICD-10-CM

## 2018-01-08 DIAGNOSIS — Z3482 Encounter for supervision of other normal pregnancy, second trimester: Secondary | ICD-10-CM

## 2018-01-08 LAB — URINALYSIS, ROUTINE W REFLEX MICROSCOPIC
Bilirubin Urine: NEGATIVE
GLUCOSE, UA: NEGATIVE mg/dL
HGB URINE DIPSTICK: NEGATIVE
Ketones, ur: NEGATIVE mg/dL
NITRITE: NEGATIVE
Protein, ur: NEGATIVE mg/dL
SPECIFIC GRAVITY, URINE: 1.023 (ref 1.005–1.030)
pH: 7 (ref 5.0–8.0)

## 2018-01-08 MED ORDER — ACETAMINOPHEN 325 MG PO TABS
650.0000 mg | ORAL_TABLET | Freq: Once | ORAL | Status: AC
  Administered 2018-01-08: 650 mg via ORAL
  Filled 2018-01-08: qty 2

## 2018-01-08 NOTE — MAU Note (Signed)
Pt has returned for U/S, denies any new symptoms, states her pain is the same as before. Per S. Weinhold CNM, pt may wait in lobby for now for U/S.

## 2018-01-08 NOTE — MAU Note (Signed)
Pt received Tylenol prior to being discharged, approved by provider to let pt leave before 20 min waiting period. Pt is to return for [place] once dropping child off with grandparent.
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
## 2018-01-08 NOTE — Discharge Instructions (Signed)
Second Trimester of Pregnancy The second trimester is from week 13 through week 28, month 4 through 6. This is often the time in pregnancy that you feel your best. Often times, morning sickness has lessened or quit. You may have more energy, and you may get hungry more often. Your unborn baby (fetus) is growing rapidly. At the end of the sixth month, he or she is about 9 inches long and weighs about 1 pounds. You will likely feel the baby move (quickening) between 18 and 20 weeks of pregnancy.  Research childbirth classes and hospital preregistration at [email or web address]  Follow these instructions at home:  Avoid all smoking, herbs, and alcohol. Avoid drugs not approved by your doctor.  Do not use any tobacco products, including cigarettes, chewing tobacco, and electronic cigarettes. If you need help quitting, ask your doctor. You may get counseling or other support to help you quit.  Only take medicine as told by your doctor. Some medicines are safe and some are not during pregnancy.  Exercise only as told by your doctor. Stop exercising if you start having cramps.  Eat regular, healthy meals.  Wear a good support bra if your breasts are tender.  Do not use hot tubs, steam rooms, or saunas.  Wear your seat belt when driving.  Avoid raw meat, uncooked cheese, and liter boxes and soil used by cats.  Take your prenatal vitamins.  Take 1500-2000 milligrams of calcium daily starting at the 20th week of pregnancy until you deliver your baby.  Try taking medicine that helps you poop (stool softener) as needed, and if your doctor approves. Eat more fiber by eating fresh fruit, vegetables, and whole grains. Drink enough fluids to keep your pee (urine) clear or pale yellow.  Take warm water baths (sitz baths) to soothe pain or discomfort caused by hemorrhoids. Use hemorrhoid cream if your doctor approves.  If you have puffy, bulging veins (varicose veins), wear support hose. Raise  (elevate) your feet for 15 minutes, 3-4 times a day. Limit salt in your diet.  Avoid heavy lifting, wear low heals, and sit up straight.  Rest with your legs raised if you have leg cramps or low back pain.  Visit your dentist if you have not gone during your pregnancy. Use a soft toothbrush to brush your teeth. Be gentle when you floss.  You can have sex (intercourse) unless your doctor tells you not to.  Go to your doctor visits.  Get help if:  You feel dizzy.  You have mild cramps or pressure in your lower belly (abdomen).  You have a nagging pain in your belly area.  You continue to feel sick to your stomach (nauseous), throw up (vomit), or have watery poop (diarrhea).  You have bad smelling fluid coming from your vagina.  You have pain with peeing (urination). Get help right away if:  You have a fever.  You are leaking fluid from your vagina.  You have spotting or bleeding from your vagina.  You have severe belly cramping or pain.  You lose or gain weight rapidly.  You have trouble catching your breath and have chest pain.  You notice sudden or extreme puffiness (swelling) of your face, hands, ankles, feet, or legs.  You have not felt the baby move in over an hour.  You have severe headaches that do not go away with medicine.  You have vision changes. This information is not intended to replace advice given to you by your health care provider. Make   sure you discuss any questions you have with your health care provider. Document Released: 06/07/2009 Document Revised: 08/19/2015 Document Reviewed: 05/14/2012 Elsevier Interactive Patient Education  2017 Elsevier Inc.    

## 2018-01-10 ENCOUNTER — Ambulatory Visit: Payer: Self-pay | Admitting: Clinical

## 2018-01-10 ENCOUNTER — Ambulatory Visit (INDEPENDENT_AMBULATORY_CARE_PROVIDER_SITE_OTHER): Payer: Self-pay | Admitting: Family Medicine

## 2018-01-10 ENCOUNTER — Other Ambulatory Visit (HOSPITAL_COMMUNITY)
Admission: RE | Admit: 2018-01-10 | Discharge: 2018-01-10 | Disposition: A | Discharge status: Home / Self Care | Payer: Medicaid Other | Source: Ambulatory Visit | Attending: Family Medicine | Admitting: Family Medicine

## 2018-01-10 ENCOUNTER — Encounter: Payer: Self-pay | Admitting: Family Medicine

## 2018-01-10 VITALS — BP 120/62 | HR 70 | Wt 161.2 lb

## 2018-01-10 DIAGNOSIS — Z3480 Encounter for supervision of other normal pregnancy, unspecified trimester: Secondary | ICD-10-CM | POA: Diagnosis present

## 2018-01-10 DIAGNOSIS — J4530 Mild persistent asthma, uncomplicated: Secondary | ICD-10-CM

## 2018-01-10 DIAGNOSIS — F3131 Bipolar disorder, current episode depressed, mild: Secondary | ICD-10-CM

## 2018-01-10 LAB — POCT URINALYSIS DIP (DEVICE)
BILIRUBIN URINE: NEGATIVE
GLUCOSE, UA: NEGATIVE mg/dL
Hgb urine dipstick: NEGATIVE
KETONES UR: NEGATIVE mg/dL
Leukocytes, UA: NEGATIVE
Nitrite: NEGATIVE
PH: 5.5 (ref 5.0–8.0)
Protein, ur: NEGATIVE mg/dL
Specific Gravity, Urine: >=1.03 (ref 1.005–1.030)
Urobilinogen, UA: 0.2 mg/dL (ref 0.0–1.0)

## 2018-01-10 MED ORDER — FLUTICASONE PROPIONATE HFA 110 MCG/ACT IN AERO: 2.0000 | INHALATION_SPRAY | Freq: Two times a day (BID) | RESPIRATORY_TRACT | 12 refills | Status: DC

## 2018-01-10 NOTE — Patient Instructions (Signed)

## 2018-01-10 NOTE — BH Specialist Note (Signed)
Integrated Behavioral Health Initial Visit  MRN: [id number] Name: Connie Cabrera  Number of Integrated Behavioral Health Clinician visits:: 1/6 Session Start time: 4:12  Session End time: 4:18 Total time: 15 minutes  Type of Service: Integrated Behavioral Health- Individual/Family Interpretor:No. Interpretor Name and Language: n/a   Warm Hand Off Completed.       SUBJECTIVE: Connie Cabrera is a 27 y.o. female accompanied by n/a Patient was referred by Tinnie Gens, MD for Initial OB introduction to integrated behavioral health services . Patient reports the following symptoms/concerns: Pt has no particular concerns today Duration of problem: n/a; Severity of problem: n/a  OBJECTIVE: Mood: Normal and Affect: Appropriate Risk of harm to self or others: No plan to harm self or others  LIFE CONTEXT: Family and Social: - School/Work: - Self-Care: - Life Changes: Current pregnancy   GOALS ADDRESSED:n/a  INTERVENTIONS:  Standardized Assessments completed: GAD-7 and PHQ 9  ASSESSMENT: Patient currently experiencing Supervision of other normal pregnancy, antepartum.   Patient may benefit from Initial OB introduction to integrated behavioral health services .  PLAN: 1. Follow up with behavioral health clinician on : As needed 2. Behavioral recommendations:  -Continue taking prenatal vitamin, as recommended by medical provider 3. Referral(s): Integrated [employer] (In Clinic) 4. "From scale of 1-10, how likely are you to follow plan?": 10  Rae Lips, LCSW  Depression screen [facility] 2/9 01/10/2018  Decreased Interest 1  Down, Depressed, Hopeless 1  PHQ - 2 Score 2  Altered sleeping 2  Tired, decreased energy 2  Change in appetite 2  Feeling bad or failure about yourself  1  Trouble concentrating 0  Moving slowly or fidgety/restless 0  Suicidal thoughts 0  PHQ-9 Score 9   GAD 7 : Generalized Anxiety Score 01/10/2018  Nervous, Anxious, on  Edge 1  Control/stop worrying 1  Worry too much - different things 1  Trouble relaxing 1  Restless 1  Easily annoyed or irritable 2  Afraid - awful might happen 0  Total GAD 7 Score 7

## 2018-01-11 DIAGNOSIS — Z3480 Encounter for supervision of other normal pregnancy, unspecified trimester: Secondary | ICD-10-CM | POA: Insufficient documentation

## 2018-01-11 LAB — GC/CHLAMYDIA PROBE AMP (~~LOC~~) NOT AT ARMC
Chlamydia: NEGATIVE
NEISSERIA GONORRHEA: NEGATIVE
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
## 2018-01-12 LAB — CULTURE, OB URINE

## 2018-01-12 LAB — URINE CULTURE, OB REFLEX: ORGANISM ID, BACTERIA: NO GROWTH

## 2018-01-15 ENCOUNTER — Encounter: Payer: Self-pay | Admitting: *Deleted

## 2018-01-18 LAB — OBSTETRIC PANEL, INCLUDING HIV
Antibody Screen: NEGATIVE
Basophils Absolute: 0 10*3/uL (ref 0.0–0.2)
Basos: 0 %
EOS (ABSOLUTE): 0.1 10*3/uL (ref 0.0–0.4)
EOS: 1 %
HEMATOCRIT: 39.1 % (ref 34.0–46.6)
HEMOGLOBIN: 13.3 g/dL (ref 11.1–15.9)
HEP B S AG: NEGATIVE
HIV Screen 4th Generation wRfx: NONREACTIVE
IMMATURE GRANS (ABS): 0.1 10*3/uL (ref 0.0–0.1)
IMMATURE GRANULOCYTES: 1 %
Lymphocytes Absolute: 3 10*3/uL (ref 0.7–3.1)
Lymphs: 28 %
MCH: 30.5 pg (ref 26.6–33.0)
MCHC: 34 g/dL (ref 31.5–35.7)
MCV: 90 fL (ref 79–97)
MONOCYTES: 6 %
Monocytes Absolute: 0.6 10*3/uL (ref 0.1–0.9)
NEUTROS ABS: 7.1 10*3/uL — AB (ref 1.4–7.0)
Neutrophils: 64 %
Platelets: 206 10*3/uL (ref 150–450)
RBC: 4.36 x10E6/uL (ref 3.77–5.28)
RDW: 12.5 % (ref 12.3–15.4)
RH TYPE: POSITIVE
RPR: NONREACTIVE
Rubella Antibodies, IGG: 4.04 index (ref >0.99)
WBC: 10.9 10*3/uL — AB (ref 3.4–10.8)

## 2018-01-18 LAB — SMN1 COPY NUMBER ANALYSIS (SMA CARRIER SCREENING)

## 2018-01-18 LAB — HEMOGLOBIN A1C
ESTIMATED AVERAGE GLUCOSE: 94 mg/dL
Hgb A1c MFr Bld: 4.9 % (ref 4.8–5.6)

## 2018-01-18 LAB — CYSTIC FIBROSIS GENE TEST

## 2018-01-21 ENCOUNTER — Encounter: Payer: Self-pay | Admitting: *Deleted

## 2018-02-04 ENCOUNTER — Encounter (HOSPITAL_COMMUNITY): Payer: Self-pay

## 2018-02-07 ENCOUNTER — Ambulatory Visit (INDEPENDENT_AMBULATORY_CARE_PROVIDER_SITE_OTHER): Payer: Medicaid Other | Admitting: Obstetrics and Gynecology

## 2018-02-07 VITALS — BP 119/69 | HR 87 | Wt 175.4 lb

## 2018-02-07 DIAGNOSIS — Z3480 Encounter for supervision of other normal pregnancy, unspecified trimester: Secondary | ICD-10-CM

## 2018-02-07 DIAGNOSIS — F3131 Bipolar disorder, current episode depressed, mild: Secondary | ICD-10-CM

## 2018-02-07 DIAGNOSIS — Z3482 Encounter for supervision of other normal pregnancy, second trimester: Secondary | ICD-10-CM | POA: Diagnosis not present
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
## 2018-02-07 NOTE — Patient Instructions (Signed)

## 2018-02-07 NOTE — Addendum Note (Signed)
Addended by: Henrietta DineNEAL, Daniya Aramburo S on: 02/07/2018 02:12 PM   Modules accepted: Orders

## 2018-02-12 ENCOUNTER — Ambulatory Visit (HOSPITAL_COMMUNITY)
Admission: RE | Admit: 2018-02-12 | Discharge: 2018-02-12 | Disposition: A | Discharge status: Home / Self Care | Payer: Medicaid Other | Source: Ambulatory Visit | Attending: Family Medicine | Admitting: Family Medicine

## 2018-02-12 DIAGNOSIS — Z363 Encounter for antenatal screening for malformations: Secondary | ICD-10-CM | POA: Insufficient documentation

## 2018-02-12 DIAGNOSIS — Z3480 Encounter for supervision of other normal pregnancy, unspecified trimester: Secondary | ICD-10-CM

## 2018-02-12 DIAGNOSIS — Z3A19 19 weeks gestation of pregnancy: Secondary | ICD-10-CM | POA: Diagnosis not present

## 2018-03-06 ENCOUNTER — Other Ambulatory Visit: Payer: Self-pay

## 2018-03-06 DIAGNOSIS — Z3480 Encounter for supervision of other normal pregnancy, unspecified trimester: Secondary | ICD-10-CM

## 2018-03-06 MED ORDER — PRENATE PIXIE 10-0.6-0.4-200 MG PO CAPS: 1.0000 | ORAL_CAPSULE | Freq: Every day | ORAL | 5 refills | Status: DC

## 2018-03-06 NOTE — Telephone Encounter (Signed)
[facility] faxed over to refill prenatal vitamins, Per protocol sent in refill to pharmacy.

## 2018-03-07 ENCOUNTER — Ambulatory Visit (INDEPENDENT_AMBULATORY_CARE_PROVIDER_SITE_OTHER): Payer: Medicaid Other | Admitting: Obstetrics and Gynecology

## 2018-03-07 VITALS — BP 109/70 | HR 62 | Wt 176.0 lb

## 2018-03-07 DIAGNOSIS — Z3480 Encounter for supervision of other normal pregnancy, unspecified trimester: Secondary | ICD-10-CM

## 2018-03-07 DIAGNOSIS — Z3482 Encounter for supervision of other normal pregnancy, second trimester: Secondary | ICD-10-CM

## 2018-03-07 DIAGNOSIS — J4530 Mild persistent asthma, uncomplicated: Secondary | ICD-10-CM

## 2018-03-07 MED ORDER — LIDOCAINE VISCOUS HCL 2 % MT SOLN: 5.0000 mL | OROMUCOSAL | 0 refills | Status: DC | PRN

## 2018-03-07 MED ORDER — PROMETHAZINE-DM 6.25-15 MG/5ML PO SYRP: 5.0000 mL | ORAL_SOLUTION | Freq: Four times a day (QID) | ORAL | 0 refills | Status: DC | PRN
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
## 2018-03-27 NOTE — L&D Delivery Note (Signed)
Delivery Note Pt progressed rapidly to completely dilated at 0439 after onset of labor/SROM occurring at 0230. She pushed well and at 4:45 AM a viable female was delivered precipitously via Vaginal, Spontaneous (Presentation: ROA).  APGAR: 8,9 ; weight 3450gm.  Cord clamped and cut by FOB after delay of ; hospital cord blood sample collected. Placenta status: spont ,intact .  Cord: 3 vessel  Anesthesia:  None Episiotomy: None Lacerations: None Est. Blood Loss (mL): 100cc  Mom to postpartum.  Baby to Couplet care / Skin to Skin.  Arabella Merles CNM 06/29/2018, 5:12 AM  Please schedule this patient for Postpartum visit in: 4 weeks with the following provider: Any provider For C/S patients schedule nurse incision check in weeks 2 weeks: no Low risk pregnancy complicated by: asthma, abnl pap smear Delivery mode:  SVD Anticipated Birth Control:  declines PP Procedures needed: none  Schedule Integrated BH visit: no (has therapist for bipolar)

## 2018-03-28 ENCOUNTER — Encounter (HOSPITAL_COMMUNITY): Payer: Self-pay | Admitting: *Deleted

## 2018-03-28 ENCOUNTER — Inpatient Hospital Stay (HOSPITAL_COMMUNITY)
Admission: AD | Admit: 2018-03-28 | Discharge: 2018-03-28 | Disposition: A | Discharge status: Home / Self Care | Payer: Medicaid Other | Attending: Obstetrics & Gynecology | Admitting: Obstetrics & Gynecology

## 2018-03-28 ENCOUNTER — Other Ambulatory Visit: Payer: Self-pay

## 2018-03-28 DIAGNOSIS — A5901 Trichomonal vulvovaginitis: Secondary | ICD-10-CM | POA: Diagnosis not present

## 2018-03-28 DIAGNOSIS — O23592 Infection of other part of genital tract in pregnancy, second trimester: Secondary | ICD-10-CM

## 2018-03-28 DIAGNOSIS — O98312 Other infections with a predominantly sexual mode of transmission complicating pregnancy, second trimester: Secondary | ICD-10-CM | POA: Diagnosis not present

## 2018-03-28 DIAGNOSIS — O26892 Other specified pregnancy related conditions, second trimester: Secondary | ICD-10-CM

## 2018-03-28 DIAGNOSIS — Z3A25 25 weeks gestation of pregnancy: Secondary | ICD-10-CM | POA: Diagnosis not present

## 2018-03-28 DIAGNOSIS — R109 Unspecified abdominal pain: Secondary | ICD-10-CM

## 2018-03-28 DIAGNOSIS — Z87891 Personal history of nicotine dependence: Secondary | ICD-10-CM | POA: Insufficient documentation

## 2018-03-28 DIAGNOSIS — Z0371 Encounter for suspected problem with amniotic cavity and membrane ruled out: Secondary | ICD-10-CM

## 2018-03-28 LAB — URINALYSIS, ROUTINE W REFLEX MICROSCOPIC
Bacteria, UA: NONE SEEN
Bilirubin Urine: NEGATIVE
Glucose, UA: NEGATIVE mg/dL
Hgb urine dipstick: NEGATIVE
Ketones, ur: NEGATIVE mg/dL
Nitrite: NEGATIVE
Protein, ur: NEGATIVE mg/dL
Specific Gravity, Urine: 1.019 (ref 1.005–1.030)
pH: 7 (ref 5.0–8.0)

## 2018-03-28 LAB — WET PREP, GENITAL
Clue Cells Wet Prep HPF POC: NONE SEEN
Sperm: NONE SEEN
Yeast Wet Prep HPF POC: NONE SEEN

## 2018-03-28 LAB — AMNISURE RUPTURE OF MEMBRANE (ROM) NOT AT ARMC: Amnisure ROM: NEGATIVE

## 2018-03-28 MED ORDER — METRONIDAZOLE 500 MG PO TABS
2000.0000 mg | ORAL_TABLET | Freq: Once | ORAL | Status: AC
  Administered 2018-03-28: 2000 mg via ORAL
  Filled 2018-03-28: qty 4
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
## 2018-03-28 NOTE — MAU Note (Signed)
Is having some terrible pains, cramping. Been going on since like Christmas.  Has a d/c - yellow, doesn't really have an odor, sometimes is watery like she is leaking.

## 2018-03-28 NOTE — MAU Note (Signed)
Pt unable to tolerate SSE or SVE - blind swabs collected.

## 2018-03-29 LAB — GC/CHLAMYDIA PROBE AMP (~~LOC~~) NOT AT ARMC
Chlamydia: NEGATIVE
Neisseria Gonorrhea: NEGATIVE

## 2018-04-03 ENCOUNTER — Ambulatory Visit (INDEPENDENT_AMBULATORY_CARE_PROVIDER_SITE_OTHER): Payer: Medicaid Other | Admitting: Nurse Practitioner

## 2018-04-03 ENCOUNTER — Encounter: Payer: Self-pay | Admitting: Family Medicine

## 2018-04-03 VITALS — BP 112/79 | HR 90 | Wt 179.1 lb

## 2018-04-03 DIAGNOSIS — Z3482 Encounter for supervision of other normal pregnancy, second trimester: Secondary | ICD-10-CM

## 2018-04-03 DIAGNOSIS — Z3480 Encounter for supervision of other normal pregnancy, unspecified trimester: Secondary | ICD-10-CM

## 2018-04-03 DIAGNOSIS — O99891 Other specified diseases and conditions complicating pregnancy: Secondary | ICD-10-CM

## 2018-04-03 DIAGNOSIS — M549 Dorsalgia, unspecified: Secondary | ICD-10-CM

## 2018-04-03 DIAGNOSIS — Z3A26 26 weeks gestation of pregnancy: Secondary | ICD-10-CM

## 2018-04-03 DIAGNOSIS — O9989 Other specified diseases and conditions complicating pregnancy, childbirth and the puerperium: Secondary | ICD-10-CM

## 2018-04-03 NOTE — Patient Instructions (Signed)
[facility] Chiropracter [phone] Call and see if visit would be covered by Medicaid Pregnancy Yoga or any type of pregnancy exercise or pool exercise
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
## 2018-04-04 ENCOUNTER — Encounter: Payer: Self-pay | Admitting: Obstetrics and Gynecology

## 2018-04-15 ENCOUNTER — Other Ambulatory Visit: Payer: Self-pay | Admitting: *Deleted

## 2018-04-15 DIAGNOSIS — Z3480 Encounter for supervision of other normal pregnancy, unspecified trimester: Secondary | ICD-10-CM

## 2018-04-16 ENCOUNTER — Other Ambulatory Visit: Payer: Self-pay

## 2018-04-16 ENCOUNTER — Telehealth: Payer: Self-pay | Admitting: Family Medicine

## 2018-04-16 ENCOUNTER — Encounter: Payer: Self-pay | Admitting: Medical

## 2018-04-16 ENCOUNTER — Ambulatory Visit (INDEPENDENT_AMBULATORY_CARE_PROVIDER_SITE_OTHER): Payer: Medicaid Other | Admitting: Medical

## 2018-04-16 VITALS — BP 108/65 | HR 84 | Wt 186.9 lb

## 2018-04-16 DIAGNOSIS — Z23 Encounter for immunization: Secondary | ICD-10-CM | POA: Diagnosis not present

## 2018-04-16 DIAGNOSIS — F3131 Bipolar disorder, current episode depressed, mild: Secondary | ICD-10-CM

## 2018-04-16 DIAGNOSIS — O9989 Other specified diseases and conditions complicating pregnancy, childbirth and the puerperium: Secondary | ICD-10-CM

## 2018-04-16 DIAGNOSIS — M549 Dorsalgia, unspecified: Secondary | ICD-10-CM

## 2018-04-16 DIAGNOSIS — Z3480 Encounter for supervision of other normal pregnancy, unspecified trimester: Secondary | ICD-10-CM | POA: Diagnosis not present
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
## 2018-04-16 NOTE — Telephone Encounter (Signed)
Called pt to let her know that her accomodation letter has been completed and ready for pick-up. No answer, LVM stating the above and to bring in $15 payment if not already paid.

## 2018-04-16 NOTE — Patient Instructions (Signed)
Fetal Movement Counts °Patient Name: ________________________________________________ Patient Due Date: ____________________ °What is a fetal movement count? ° °A fetal movement count is the number of times that you feel your baby move during a certain amount of time. This may also be called a fetal kick count. A fetal movement count is recommended for every pregnant woman. You may be asked to start counting fetal movements as early as week 28 of your pregnancy. °Pay attention to when your baby is most active. You may notice your baby's sleep and wake cycles. You may also notice things that make your baby move more. You should do a fetal movement count: °· When your baby is normally most active. °· At the same time each day. °A good time to count movements is while you are resting, after having something to eat and drink. °How do I count fetal movements? °1. Find a quiet, comfortable area. Sit, or lie down on your side. °2. Write down the date, the start time and stop time, and the number of movements that you felt between those two times. Take this information with you to your health care visits. °3. For 2 hours, count kicks, flutters, swishes, rolls, and jabs. You should feel at least 10 movements during 2 hours. °4. You may stop counting after you have felt 10 movements. °5. If you do not feel 10 movements in 2 hours, have something to eat and drink. Then, keep resting and counting for 1 hour. If you feel at least 4 movements during that hour, you may stop counting. °Contact a health care provider if: °· You feel fewer than 4 movements in 2 hours. °· Your baby is not moving like he or she usually does. °Date: ____________ Start time: ____________ Stop time: ____________ Movements: ____________ °Date: ____________ Start time: ____________ Stop time: ____________ Movements: ____________ °Date: ____________ Start time: ____________ Stop time: ____________ Movements: ____________ °Date: ____________ Start time:  ____________ Stop time: ____________ Movements: ____________ °Date: ____________ Start time: ____________ Stop time: ____________ Movements: ____________ °Date: ____________ Start time: ____________ Stop time: ____________ Movements: ____________ °Date: ____________ Start time: ____________ Stop time: ____________ Movements: ____________ °Date: ____________ Start time: ____________ Stop time: ____________ Movements: ____________ °Date: ____________ Start time: ____________ Stop time: ____________ Movements: ____________ °This information is not intended to replace advice given to you by your health care provider. Make sure you discuss any questions you have with your health care provider. °Document Released: 04/12/2006 Document Revised: 11/10/2015 Document Reviewed: 04/22/2015 °Elsevier Interactive Patient Education © 2019 [employer] °Signs and Symptoms of Labor °Labor is your body's natural process of moving your baby, placenta, and umbilical cord out of your uterus. The process of labor usually starts when your baby is full-term, between 37 and 40 weeks of pregnancy. °How will I know when I am close to going into labor? °As your body prepares for labor and the birth of your baby, you may notice the following symptoms in the weeks and days before true labor starts: °· Having a strong desire to get your home ready to receive your new baby. This is called nesting. Nesting may be a sign that labor is approaching, and it may occur several weeks before birth. Nesting may involve cleaning and organizing your home. °· Passing a small amount of thick, bloody mucus out of your vagina (normal bloody show or losing your mucus plug). This may happen more than a week before labor begins, or it might occur right before labor begins as the opening of the cervix   starts to widen (dilate). For some women, the entire mucus plug passes at once. For others, smaller portions of the mucus plug may gradually pass over several  days. °· Your baby moving (dropping) lower in your pelvis to get into position for birth (lightening). When this happens, you may feel more pressure on your bladder and pelvic bone and less pressure on your ribs. This may make it easier to breathe. It may also cause you to need to urinate more often and have problems with bowel movements. °· Having "practice contractions" (Braxton Hicks contractions) that occur at irregular (unevenly spaced) intervals that are more than 10 minutes apart. This is also called false labor. False labor contractions are common after exercise or sexual activity, and they will stop if you change position, rest, or drink fluids. These contractions are usually mild and do not get stronger over time. They may feel like: °? A backache or back pain. °? Mild cramps, similar to menstrual cramps. °? Tightening or pressure in your abdomen. °Other early symptoms that labor may be starting soon include: °· Nausea or loss of appetite. °· Diarrhea. °· Having a sudden burst of energy, or feeling very tired. °· Mood changes. °· Having trouble sleeping. °How will I know when labor has begun? °Signs that true labor has begun may include: °· Having contractions that come at regular (evenly spaced) intervals and increase in intensity. This may feel like more intense tightening or pressure in your abdomen that moves to your back. °? Contractions may also feel like rhythmic pain in your upper thighs or back that comes and goes at regular intervals. °? For first-time mothers, this change in intensity of contractions often occurs at a more gradual pace. °? Women who have given birth before may notice a more rapid progression of contraction changes. °· Having a feeling of pressure in the vaginal area. °· Your water breaking (rupture of membranes). This is when the sac of fluid that surrounds your baby breaks. When this happens, you will notice fluid leaking from your vagina. This may be clear or blood-tinged.  Labor usually starts within 24 hours of your water breaking, but it may take longer to begin. °? Some women notice this as a gush of fluid. °? Others notice that their underwear repeatedly becomes damp. °Follow these instructions at home: ° °· When labor starts, or if your water breaks, call your health care provider or nurse care line. Based on your situation, they will determine when you should go in for an exam. °· When you are in early labor, you may be able to rest and manage symptoms at home. Some strategies to try at home include: °? Breathing and relaxation techniques. °? Taking a warm bath or shower. °? Listening to music. °? Using a heating pad on the lower back for pain. If you are directed to use heat: °§ Place a towel between your skin and the heat source. °§ Leave the heat on for 20-30 minutes. °§ Remove the heat if your skin turns bright red. This is especially important if you are unable to feel pain, heat, or cold. You may have a greater risk of getting burned. °Get help right away if: °· You have painful, regular contractions that are 5 minutes apart or less. °· Labor starts before you are [redacted] weeks along in your pregnancy. °· You have a fever. °· You have a headache that does not go away. °· You have bright red blood coming from your vagina. °·   You do not feel your baby moving. °· You have a sudden onset of: °? Severe headache with vision problems. °? Nausea, vomiting, or diarrhea. °? Chest pain or shortness of breath. °These symptoms may be an emergency. If your health care provider recommends that you go to the hospital or birth center where you plan to deliver, do not drive yourself. Have someone else drive you, or call emergency services (911 in the [place] °Summary °· Labor is your body's natural process of moving your baby, placenta, and umbilical cord out of your uterus. °· The process of labor usually starts when your baby is full-term, between 37 and 40 weeks of pregnancy. °· When labor  starts, or if your water breaks, call your health care provider or nurse care line. Based on your situation, they will determine when you should go in for an exam. °This information is not intended to replace advice given to you by your health care provider. Make sure you discuss any questions you have with your health care provider. °Document Released: 08/18/2016 Document Revised: 08/18/2016 Document Reviewed: 08/18/2016 °Elsevier Interactive Patient Education © 2019 Else[employer] Inc. ° °

## 2018-04-17 LAB — GLUCOSE TOLERANCE, 2 HOURS W/ 1HR
GLUCOSE, FASTING: 84 mg/dL (ref 65–91)
Glucose, 1 hour: 138 mg/dL (ref 65–179)
Glucose, 2 hour: 90 mg/dL (ref 65–152)

## 2018-04-17 LAB — CBC
Hematocrit: 34.2 % (ref 34.0–46.6)
Hemoglobin: 11.5 g/dL (ref 11.1–15.9)
MCH: 30.9 pg (ref 26.6–33.0)
MCHC: 33.6 g/dL (ref 31.5–35.7)
MCV: 92 fL (ref 79–97)
Platelets: 171 10*3/uL (ref 150–450)
RBC: 3.72 x10E6/uL — ABNORMAL LOW (ref 3.77–5.28)
RDW: 12.6 % (ref 11.7–15.4)
WBC: 10.9 10*3/uL — ABNORMAL HIGH (ref 3.4–10.8)

## 2018-04-17 LAB — RPR: RPR Ser Ql: NONREACTIVE

## 2018-04-17 LAB — HIV ANTIBODY (ROUTINE TESTING W REFLEX): HIV Screen 4th Generation wRfx: NONREACTIVE

## 2018-04-24 DIAGNOSIS — Z029 Encounter for administrative examinations, unspecified: Secondary | ICD-10-CM

## 2018-04-29 ENCOUNTER — Other Ambulatory Visit (HOSPITAL_COMMUNITY)
Admission: RE | Admit: 2018-04-29 | Discharge: 2018-04-29 | Disposition: A | Discharge status: Home / Self Care | Payer: Medicaid Other | Source: Ambulatory Visit | Attending: Student | Admitting: Student

## 2018-04-29 ENCOUNTER — Encounter: Payer: Self-pay | Admitting: Advanced Practice Midwife

## 2018-04-29 ENCOUNTER — Ambulatory Visit (INDEPENDENT_AMBULATORY_CARE_PROVIDER_SITE_OTHER): Payer: Medicaid Other | Admitting: Advanced Practice Midwife

## 2018-04-29 VITALS — BP 116/74 | HR 84 | Wt 192.5 lb

## 2018-04-29 DIAGNOSIS — A599 Trichomoniasis, unspecified: Secondary | ICD-10-CM | POA: Diagnosis present

## 2018-04-29 DIAGNOSIS — O2243 Hemorrhoids in pregnancy, third trimester: Secondary | ICD-10-CM

## 2018-04-29 MED ORDER — HYDROCORTISONE ACETATE 25 MG RE SUPP: 25.0000 mg | Freq: Two times a day (BID) | RECTAL | 3 refills | Status: DC

## 2018-04-29 MED ORDER — DOCUSATE SODIUM 100 MG PO CAPS: 100.0000 mg | ORAL_CAPSULE | Freq: Two times a day (BID) | ORAL | 1 refills | Status: DC

## 2018-04-29 NOTE — Patient Instructions (Signed)
Hemorrhoids Hemorrhoids are swollen veins that may develop:  In the butt (rectum). These are called internal hemorrhoids.  Around the opening of the butt (anus). These are called external hemorrhoids. Hemorrhoids can cause pain, itching, or bleeding. Most of the time, they do not cause serious problems. They usually get better with diet changes, lifestyle changes, and other home treatments. What are the causes? This condition may be caused by:  Having trouble pooping (constipation).  Pushing hard (straining) to poop.  Watery poop (diarrhea).  Pregnancy.  Being very overweight (obese).  Sitting for long periods of time.  Heavy lifting or other activity that causes you to strain.  Anal sex.  Riding a bike for a long period of time. What are the signs or symptoms? Symptoms of this condition include:  Pain.  Itching or soreness in the butt.  Bleeding from the butt.  Leaking poop.  Swelling in the area.  One or more lumps around the opening of your butt. How is this diagnosed? A doctor can often diagnose this condition by looking at the affected area. The doctor may also:  Do an exam that involves feeling the area with a gloved hand (digital rectal exam).  Examine the area inside your butt using a small tube (anoscope).  Order blood tests. This may be done if you have lost a lot of blood.  Have you get a test that involves looking inside the colon using a flexible tube with a camera on the end (sigmoidoscopy or colonoscopy). How is this treated? This condition can usually be treated at home. Your doctor may tell you to change what you eat, make lifestyle changes, or try home treatments. If these do not help, procedures can be done to remove the hemorrhoids or make them smaller. These may involve:  Placing rubber bands at the base of the hemorrhoids to cut off their blood supply.  Injecting medicine into the hemorrhoids to shrink them.  Shining a type of light  energy onto the hemorrhoids to cause them to fall off.  Doing surgery to remove the hemorrhoids or cut off their blood supply. Follow these instructions at home: Eating and drinking   Eat foods that have a lot of fiber in them. These include whole grains, beans, nuts, fruits, and vegetables.  Ask your doctor about taking products that have added fiber (fibersupplements).  Reduce the amount of fat in your diet. You can do this by: ? Eating low-fat dairy products. ? Eating less red meat. ? Avoiding processed foods.  Drink enough fluid to keep your pee (urine) pale yellow. Managing pain and swelling   Take a warm-water bath (sitz bath) for 20 minutes to ease pain. Do this 3-4 times a day. You may do this in a bathtub or using a portable sitz bath that fits over the toilet.  If told, put ice on the painful area. It may be helpful to use ice between your warm baths. ? Put ice in a plastic bag. ? Place a towel between your skin and the bag. ? Leave the ice on for 20 minutes, 2-3 times a day. General instructions  Take over-the-counter and prescription medicines only as told by your doctor. ? Medicated creams and medicines may be used as told.  Exercise often. Ask your doctor how much and what kind of exercise is best for you.  Go to the bathroom when you have the urge to poop. Do not wait.  Avoid pushing too hard when you poop.  Keep your   butt dry and clean. Use wet toilet paper or moist towelettes after pooping.  Do not sit on the toilet for a long time.  Keep all follow-up visits as told by your doctor. This is important. Contact a doctor if you:  Have pain and swelling that do not get better with treatment or medicine.  Have trouble pooping.  Cannot poop.  Have pain or swelling outside the area of the hemorrhoids. Get help right away if you have:  Bleeding that will not stop. Summary  Hemorrhoids are swollen veins in the butt or around the opening of the butt.   They can cause pain, itching, or bleeding.  Eat foods that have a lot of fiber in them. These include whole grains, beans, nuts, fruits, and vegetables.  Take a warm-water bath (sitz bath) for 20 minutes to ease pain. Do this 3-4 times a day. This information is not intended to replace advice given to you by your health care provider. Make sure you discuss any questions you have with your health care provider. Document Released: 12/21/2007 Document Revised: 08/02/2017 Document Reviewed: 08/02/2017 Elsevier Interactive Patient Education  2019 Else[employer] Inc[employer]  
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
## 2018-04-29 NOTE — Progress Notes (Signed)
Hemorrhoids are really bothering her.

## 2018-04-30 ENCOUNTER — Telehealth: Payer: Self-pay | Admitting: Emergency Medicine

## 2018-04-30 ENCOUNTER — Encounter: Payer: Self-pay | Admitting: Student

## 2018-04-30 LAB — CERVICOVAGINAL ANCILLARY ONLY: Trichomonas: NEGATIVE

## 2018-05-01 ENCOUNTER — Telehealth: Payer: Self-pay | Admitting: *Deleted

## 2018-05-01 NOTE — Telephone Encounter (Signed)
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 States she is calling re: the paperwork we sent over- states it does not state on question 2 the medical condition which they assume is pregnancy but it still needs to state that. Also need her EDD. Would like to update her paperwork and resend it. Her phone number is [phone] if we have questions and fax number is: [phone].

## 2018-05-09 ENCOUNTER — Telehealth: Payer: Self-pay | Admitting: *Deleted

## 2018-05-09 NOTE — Telephone Encounter (Signed)
Connie Cabrera called this am and left a message that she is stuffy/ congested/ cold / cough. Also states her stomach gets tight off and on and is not sure if is braxton hicks or what; also c/o sharp pains lower left side. Wants to know if she should come to hospital or make appointment in our office.   Per chart review is [redacted] weeks pregnant. Called patient and left message I am calling her back and will send a message via MYChart but if she is having severe symtoms or frequent contractions to go to [facility] for evaluation; otherwise may call us back.

## 2018-05-09 NOTE — Telephone Encounter (Signed)
Connie Cabrera called office before I sent MyChart message and call transferred to nurse.  Connie Cabrera c/o feeling tightning in her abdomen and states not good with keeping up with when she has them but states they were more often before  but now less often. I instructed her to write down when she feels them and if she is having 4 or 5 in an hour to come to [facility] for evaluation. I also advised her to be sure she is drinking plenty of water as dehydration can cause some contractions. She also c/o intermittent sharp pains=6 in her pubic area around to left lower pelvic. We discussed may be round ligament and can take tylenol for that and position for comfort but if she feels her pain is severe to come to [facility] for evaluation.  We also discussed that she may take delsym, robitussin plain/dm, or mucinex plain/dm for her symtoms. Also advised her if her symtoms worsen and trouble breathing since she has asthma to come to [facility] for evaluation. She voices understanding.

## 2018-05-15 ENCOUNTER — Ambulatory Visit (INDEPENDENT_AMBULATORY_CARE_PROVIDER_SITE_OTHER): Payer: Medicaid Other | Admitting: Medical

## 2018-05-15 ENCOUNTER — Encounter: Payer: Self-pay | Admitting: Medical

## 2018-05-15 ENCOUNTER — Encounter: Payer: Self-pay | Admitting: Family Medicine

## 2018-05-15 DIAGNOSIS — Z3483 Encounter for supervision of other normal pregnancy, third trimester: Secondary | ICD-10-CM

## 2018-05-15 DIAGNOSIS — Z3A32 32 weeks gestation of pregnancy: Secondary | ICD-10-CM

## 2018-05-15 DIAGNOSIS — Z3480 Encounter for supervision of other normal pregnancy, unspecified trimester: Secondary | ICD-10-CM

## 2018-05-15 NOTE — Patient Instructions (Signed)
Fetal Movement Counts Patient Name: ________________________________________________ Patient Due Date: ____________________ What is a fetal movement count?  A fetal movement count is the number of times that you feel your baby move during a certain amount of time. This may also be called a fetal kick count. A fetal movement count is recommended for every pregnant woman. You may be asked to start counting fetal movements as early as week 28 of your pregnancy. Pay attention to when your baby is most active. You may notice your baby's sleep and wake cycles. You may also notice things that make your baby move more. You should do a fetal movement count:  When your baby is normally most active.  At the same time each day. A good time to count movements is while you are resting, after having something to eat and drink. How do I count fetal movements? 1. Find a quiet, comfortable area. Sit, or lie down on your side. 2. Write down the date, the start time and stop time, and the number of movements that you felt between those two times. Take this information with you to your health care visits. 3. For 2 hours, count kicks, flutters, swishes, rolls, and jabs. You should feel at least 10 movements during 2 hours. 4. You may stop counting after you have felt 10 movements. 5. If you do not feel 10 movements in 2 hours, have something to eat and drink. Then, keep resting and counting for 1 hour. If you feel at least 4 movements during that hour, you may stop counting. Contact a health care provider if:  You feel fewer than 4 movements in 2 hours.  Your baby is not moving like he or she usually does. Date: ____________ Start time: ____________ Stop time: ____________ Movements: ____________ Date: ____________ Start time: ____________ Stop time: ____________ Movements: ____________ Date: ____________ Start time: ____________ Stop time: ____________ Movements: ____________ Date: ____________ Start time:  ____________ Stop time: ____________ Movements: ____________ Date: ____________ Start time: ____________ Stop time: ____________ Movements: ____________ Date: ____________ Start time: ____________ Stop time: ____________ Movements: ____________ Date: ____________ Start time: ____________ Stop time: ____________ Movements: ____________ Date: ____________ Start time: ____________ Stop time: ____________ Movements: ____________ Date: ____________ Start time: ____________ Stop time: ____________ Movements: ____________ This information is not intended to replace advice given to you by your health care provider. Make sure you discuss any questions you have with your health care provider. Document Released: 04/12/2006 Document Revised: 11/10/2015 Document Reviewed: 04/22/2015 Elsevier Interactive Patient Education  2019 [employer] Braxton Hicks Contractions Contractions of the uterus can occur throughout pregnancy, but they are not always a sign that you are in labor. You may have practice contractions called Braxton Hicks contractions. These false labor contractions are sometimes confused with true labor. What are Braxton Hicks contractions? Braxton Hicks contractions are tightening movements that occur in the muscles of the uterus before labor. Unlike true labor contractions, these contractions do not result in opening (dilation) and thinning of the cervix. Toward the end of pregnancy (32-34 weeks), Braxton Hicks contractions can happen more often and may become stronger. These contractions are sometimes difficult to tell apart from true labor because they can be very uncomfortable. You should not feel embarrassed if you go to the hospital with false labor. Sometimes, the only way to tell if you are in true labor is for your health care provider to look for changes in the cervix. The health care provider will do a physical exam and may monitor your contractions. If   you are not in true labor, the exam  should show that your cervix is not dilating and your water has not broken. If there are no other health problems associated with your pregnancy, it is completely safe for you to be sent home with false labor. You may continue to have Braxton Hicks contractions until you go into true labor. How to tell the difference between true labor and false labor True labor  Contractions last 30-70 seconds.  Contractions become very regular.  Discomfort is usually felt in the top of the uterus, and it spreads to the lower abdomen and low back.  Contractions do not go away with walking.  Contractions usually become more intense and increase in frequency.  The cervix dilates and gets thinner. False labor  Contractions are usually shorter and not as strong as true labor contractions.  Contractions are usually irregular.  Contractions are often felt in the front of the lower abdomen and in the groin.  Contractions may go away when you walk around or change positions while lying down.  Contractions get weaker and are shorter-lasting as time goes on.  The cervix usually does not dilate or become thin. Follow these instructions at home:   Take over-the-counter and prescription medicines only as told by your health care provider.  Keep up with your usual exercises and follow other instructions from your health care provider.  Eat and drink lightly if you think you are going into labor.  If Braxton Hicks contractions are making you uncomfortable: ? Change your position from lying down or resting to walking, or change from walking to resting. ? Sit and rest in a tub of warm water. ? Drink enough fluid to keep your urine pale yellow. Dehydration may cause these contractions. ? Do slow and deep breathing several times an hour.  Keep all follow-up prenatal visits as told by your health care provider. This is important. Contact a health care provider if:  You have a fever.  You have continuous  pain in your abdomen. Get help right away if:  Your contractions become stronger, more regular, and closer together.  You have fluid leaking or gushing from your vagina.  You pass blood-tinged mucus (bloody show).  You have bleeding from your vagina.  You have low back pain that you never had before.  You feel your baby's head pushing down and causing pelvic pressure.  Your baby is not moving inside you as much as it used to. Summary  Contractions that occur before labor are called Braxton Hicks contractions, false labor, or practice contractions.  Braxton Hicks contractions are usually shorter, weaker, farther apart, and less regular than true labor contractions. True labor contractions usually become progressively stronger and regular, and they become more frequent.  Manage discomfort from Braxton Hicks contractions by changing position, resting in a warm bath, drinking plenty of water, or practicing deep breathing. This information is not intended to replace advice given to you by your health care provider. Make sure you discuss any questions you have with your health care provider. Document Released: 07/27/2016 Document Revised: 12/26/2016 Document Reviewed: 07/27/2016 Elsevier Interactive Patient Education  2019 [employer] Inc.  
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
## 2018-05-21 NOTE — Telephone Encounter (Signed)
Opened in error

## 2018-05-23 ENCOUNTER — Telehealth: Payer: Self-pay | Admitting: *Deleted

## 2018-05-23 ENCOUNTER — Inpatient Hospital Stay (HOSPITAL_COMMUNITY)
Admission: AD | Admit: 2018-05-23 | Discharge: 2018-05-23 | Disposition: A | Discharge status: Home / Self Care | Payer: Medicaid Other | Attending: Obstetrics & Gynecology | Admitting: Obstetrics & Gynecology

## 2018-05-23 ENCOUNTER — Encounter (HOSPITAL_COMMUNITY): Payer: Self-pay

## 2018-05-23 DIAGNOSIS — J45909 Unspecified asthma, uncomplicated: Secondary | ICD-10-CM

## 2018-05-23 DIAGNOSIS — R0602 Shortness of breath: Secondary | ICD-10-CM | POA: Diagnosis present

## 2018-05-23 DIAGNOSIS — O99513 Diseases of the respiratory system complicating pregnancy, third trimester: Secondary | ICD-10-CM | POA: Diagnosis not present

## 2018-05-23 DIAGNOSIS — O9989 Other specified diseases and conditions complicating pregnancy, childbirth and the puerperium: Secondary | ICD-10-CM | POA: Diagnosis not present

## 2018-05-23 DIAGNOSIS — M549 Dorsalgia, unspecified: Secondary | ICD-10-CM | POA: Diagnosis not present

## 2018-05-23 DIAGNOSIS — Z3A33 33 weeks gestation of pregnancy: Secondary | ICD-10-CM

## 2018-05-23 DIAGNOSIS — O99891 Other specified diseases and conditions complicating pregnancy: Secondary | ICD-10-CM

## 2018-05-23 LAB — URINALYSIS, ROUTINE W REFLEX MICROSCOPIC
Bilirubin Urine: NEGATIVE
Glucose, UA: NEGATIVE mg/dL
Hgb urine dipstick: NEGATIVE
Ketones, ur: 5 mg/dL — AB
Leukocytes,Ua: NEGATIVE
Nitrite: NEGATIVE
Protein, ur: NEGATIVE mg/dL
Specific Gravity, Urine: 1.02 (ref 1.005–1.030)
pH: 7 (ref 5.0–8.0)

## 2018-05-23 LAB — INFLUENZA PANEL BY PCR (TYPE A & B)
Influenza A By PCR: NEGATIVE
Influenza B By PCR: NEGATIVE

## 2018-05-23 LAB — GROUP A STREP BY PCR: GROUP A STREP BY PCR: NOT DETECTED

## 2018-05-23 MED ORDER — IPRATROPIUM-ALBUTEROL 0.5-2.5 (3) MG/3ML IN SOLN
3.0000 mL | Freq: Once | RESPIRATORY_TRACT | Status: AC
  Administered 2018-05-23: 3 mL via RESPIRATORY_TRACT
  Filled 2018-05-23: qty 3

## 2018-05-23 NOTE — Telephone Encounter (Signed)
Patient called and left message stating she has been taking care of her sick child all week who has pneumonia and rhinitis. States she also sent a message on Mychart.  Wants to know if she should be seen - c/o really bad cramps, edema, throat on fire , sick.    I reviewed her Mychart message and called [place] to discuss. We discussed she has various complaints - she denies vomiting; does c/o headaches, states the edema was bad at bedtime last night; but better this am.. she c/o baby movement is a lot more than usual .  We discussed if she only complaint was a sore throat to go to PCP or urgent care but since she is c/o headaches, edema, baby movement different and that when she walks with left foot her leg feels like is is going to collapse- to go to [facility] at [facility]. She voices understanding.

## 2018-05-23 NOTE — Discharge Instructions (Signed)
Heartburn During Pregnancy °Heartburn is a type of pain or discomfort that can happen in the throat or chest. It is often described as a burning sensation. Heartburn is common during pregnancy because: °· A hormone (progesterone) that is released during pregnancy may relax the valve (lower esophageal sphincter, or LES) that separates the esophagus from the stomach. This allows stomach acid to move up into the esophagus, causing heartburn. °· The uterus gets larger and pushes up on the stomach, which pushes more acid into the esophagus. This is especially true in the later stages of pregnancy. °Heartburn usually goes away or gets better after giving birth. °What are the causes? °Heartburn is caused by stomach acid backing up into the esophagus (reflux). Reflux can be triggered by: °· Changing hormone levels. °· Large meals. °· Certain foods and beverages, such as coffee, chocolate, onions, and peppermint. °· Exercise. °· Increased stomach acid production. °What increases the risk? °You are more likely to experience heartburn during pregnancy if you: °· Had heartburn prior to becoming pregnant. °· Have been pregnant more than once before. °· Are overweight or obese. °The likelihood that you will get heartburn also increases as you get farther along in your pregnancy, especially during the last trimester. °What are the signs or symptoms? °Symptoms of this condition include: °· Burning pain in the chest or lower throat. °· Bitter taste in the mouth. °· Coughing. °· Problems swallowing. °· Vomiting. °· Hoarse voice. °· Asthma. °Symptoms may get worse when you lie down or bend over. Symptoms are often worse at night. °How is this diagnosed? °This condition is diagnosed based on: °· Your medical history. °· Your symptoms. °· Blood tests to check for a certain type of bacteria associated with heartburn. °· Whether taking heartburn medicine relieves your symptoms. °· Examination of the stomach and esophagus using a tube with  a light and camera on the end (endoscopy). °How is this treated? °Treatment varies depending on how severe your symptoms are. Your health care provider may recommend: °· Over-the-counter medicines (antacids or acid reducers) for mild heartburn. °· Prescription medicines to decrease stomach acid or to protect your stomach lining. °· Certain changes in your diet. °· Raising the head of your bed so it is higher than the foot of the bed. This helps prevent stomach acid from backing up into the esophagus when you are lying down. °Follow these instructions at home: °Eating and drinking °· Do not drink alcohol during your pregnancy. °· Identify foods and beverages that make your symptoms worse, and avoid them. °· Beverages that you may want to avoid include: °? Coffee and tea (with or without caffeine). °? Energy drinks and sports drinks. °? Carbonated drinks or sodas. °? Citrus fruit juices. °· Foods that you may want to avoid include: °? Chocolate and cocoa. °? Peppermint and mint flavorings. °? Garlic, onions, and horseradish. °? Spicy and acidic foods, including peppers, chili powder, curry powder, vinegar, hot sauces, and barbecue sauce. °? Citrus fruits, such as oranges, lemons, and limes. °? Tomato-based foods, such as red sauce, chili, and salsa. °? Fried and fatty foods, such as donuts, french fries, potato chips, and high-fat dressings. °? High-fat meats, such as hot dogs, cold cuts, sausage, ham, and bacon. °? High-fat dairy items, such as whole milk, butter, and cheese. °· Eat small, frequent meals instead of large meals. °· Avoid drinking large amounts of liquid with your meals. °· Avoid eating meals during the 2-3 hours before bedtime. °· Avoid lying down right   after you eat.  Do not exercise right after you eat. Medicines  Take over-the-counter and prescription medicines only as told by your health care provider.  Do not take aspirin, ibuprofen, or other NSAIDs unless your health care provider tells  you to do that.  You may be instructed to avoid medicines that contain sodium bicarbonate. General instructions   If directed, raise the head of your bed about 6 inches (15 cm) by putting blocks under the legs. Sleeping with more pillows does not effectively relieve heartburn because it only changes the position of your head.  Do not use any products that contain nicotine or tobacco, such as cigarettes and e-cigarettes. If you need help quitting, ask your health care provider.  Wear loose-fitting clothing.  Try to reduce your stress, such as with yoga or meditation. If you need help managing stress, ask your health care provider.  Maintain a healthy weight. If you are overweight, work with your health care provider to safely lose weight.  Keep all follow-up visits as told by your health care provider. This is important. Contact a health care provider if:  You develop new symptoms.  Your symptoms do not improve with treatment.  You have unexplained weight loss.  You have difficulty swallowing.  You make loud sounds when you breathe (wheeze).  You have a cough that does not go away.  You have frequent heartburn for more than 2 weeks.  You have nausea or vomiting that does not get better with treatment.  You have pain in your abdomen. Get help right away if:  You have severe chest pain that spreads to your arm, neck, or jaw.  You feel sweaty, dizzy, or light-headed.  You have shortness of breath.  You have pain when swallowing.  You vomit, and your vomit looks like blood or coffee grounds.  Your stool is bloody or black. This information is not intended to replace advice given to you by your health care provider. Make sure you discuss any questions you have with your health care provider. Document Released: 03/10/2000 Document Revised: 11/29/2015 Document Reviewed: 11/29/2015 Elsevier Interactive Patient Education  2019 [employer] Back Pain in Pregnancy Back pain  during pregnancy is common. Back pain may be caused by several factors that are related to changes during your pregnancy. Follow these instructions at home: Managing pain, stiffness, and swelling      If directed, for sudden (acute) back pain, put ice on the painful area. ? Put ice in a plastic bag. ? Place a towel between your skin and the bag. ? Leave the ice on for 20 minutes, 2-3 times per day.  If directed, apply heat to the affected area before you exercise. Use the heat source that your health care provider recommends, such as a moist heat pack or a heating pad. ? Place a towel between your skin and the heat source. ? Leave the heat on for 20-30 minutes. ? Remove the heat if your skin turns bright red. This is especially important if you are unable to feel pain, heat, or cold. You may have a greater risk of getting burned.  If directed, massage the affected area. Activity  Exercise as told by your health care provider. Gentle exercise is the best way to prevent or manage back pain.  Listen to your body when lifting. If lifting hurts, ask for help or bend your knees. This uses your leg muscles instead of your back muscles.  Squat down when picking up something from  the floor. Do not bend over.  Only use bed rest for short periods as told by your health care provider. Bed rest should only be used for the most severe episodes of back pain. Standing, sitting, and lying down  Do not stand in one place for long periods of time.  Use good posture when sitting. Make sure your head rests over your shoulders and is not hanging forward. Use a pillow on your lower back if necessary.  Try sleeping on your side, preferably the left side, with a pregnancy support pillow or 1-2 regular pillows between your legs. ? If you have back pain after a night's rest, your bed may be too soft. ? A firm mattress may provide more support for your back during pregnancy. General instructions  Do not  wear high heels.  Eat a healthy diet. Try to gain weight within your health care provider's recommendations.  Use a maternity girdle, elastic sling, or back brace as told by your health care provider.  Take over-the-counter and prescription medicines only as told by your health care provider.  Work with a physical therapist or massage therapist to find ways to manage back pain. Acupuncture or massage therapy may be helpful.  Keep all follow-up visits as told by your health care provider. This is important. Contact a health care provider if:  Your back pain interferes with your daily activities.  You have increasing pain in other parts of your body. Get help right away if:  You develop numbness, tingling, weakness, or problems with the use of your arms or legs.  You develop severe back pain that is not controlled with medicine.  You have a change in bowel or bladder control.  You develop shortness of breath, dizziness, or you faint.  You develop nausea, vomiting, or sweating.  You have back pain that is a rhythmic, cramping pain similar to labor pains. Labor pain is usually 1-2 minutes apart, lasts for about 1 minute, and involves a bearing down feeling or pressure in your pelvis.  You have back pain and your water breaks or you have vaginal bleeding.  You have back pain or numbness that travels down your leg.  Your back pain developed after you fell.  You develop pain on one side of your back.  You see blood in your urine.  You develop skin blisters in the area of your back pain. Summary  Back pain may be caused by several factors that are related to changes during your pregnancy.  Follow instructions as told by your health care provider for managing pain, stiffness, and swelling.  Exercise as told by your health care provider. Gentle exercise is the best way to prevent or manage back pain.  Take over-the-counter and prescription medicines only as told by your health  care provider.  Keep all follow-up visits as told by your health care provider. This is important. This information is not intended to replace advice given to you by your health care provider. Make sure you discuss any questions you have with your health care provider. Document Released: 06/21/2005 Document Revised: 08/29/2017 Document Reviewed: 08/29/2017 Elsevier Interactive Patient Education  2019 [employer]  Fetal Movement Counts Patient Name: ________________________________________________ Patient Due Date: ____________________ What is a fetal movement count?  A fetal movement count is the number of times that you feel your baby move during a certain amount of time. This may also be called a fetal kick count. A fetal movement count is recommended for every pregnant woman.  You may be asked to start counting fetal movements as early as week 28 of your pregnancy. Pay attention to when your baby is most active. You may notice your baby's sleep and wake cycles. You may also notice things that make your baby move more. You should do a fetal movement count:  When your baby is normally most active.  At the same time each day. A good time to count movements is while you are resting, after having something to eat and drink. How do I count fetal movements? 1. Find a quiet, comfortable area. Sit, or lie down on your side. 2. Write down the date, the start time and stop time, and the number of movements that you felt between those two times. Take this information with you to your health care visits. 3. For 2 hours, count kicks, flutters, swishes, rolls, and jabs. You should feel at least 10 movements during 2 hours. 4. You may stop counting after you have felt 10 movements. 5. If you do not feel 10 movements in 2 hours, have something to eat and drink. Then, keep resting and counting for 1 hour. If you feel at least 4 movements during that hour, you may stop counting. Contact a health care  provider if:  You feel fewer than 4 movements in 2 hours.  Your baby is not moving like he or she usually does. Date: ____________ Start time: ____________ Stop time: ____________ Movements: ____________ Date: ____________ Start time: ____________ Stop time: ____________ Movements: ____________ Date: ____________ Start time: ____________ Stop time: ____________ Movements: ____________ Date: ____________ Start time: ____________ Stop time: ____________ Movements: ____________ Date: ____________ Start time: ____________ Stop time: ____________ Movements: ____________ Date: ____________ Start time: ____________ Stop time: ____________ Movements: ____________ Date: ____________ Start time: ____________ Stop time: ____________ Movements: ____________ Date: ____________ Start time: ____________ Stop time: ____________ Movements: ____________ Date: ____________ Start time: ____________ Stop time: ____________ Movements: ____________ This information is not intended to replace advice given to you by your health care provider. Make sure you discuss any questions you have with your health care provider. Document Released: 04/12/2006 Document Revised: 11/10/2015 Document Reviewed: 04/22/2015 Elsevier Interactive Patient Education  2019 [employer].

## 2018-05-23 NOTE — MAU Note (Signed)
Pt stated she feels SOB concerned because her daughter is home with pneumonia. Also c/o leg and feet are swelling and has pain that shoots down herLeft leg from her back.
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
## 2018-05-27 ENCOUNTER — Telehealth: Payer: Self-pay | Admitting: *Deleted

## 2018-05-27 ENCOUNTER — Telehealth: Payer: Self-pay | Admitting: Family Medicine

## 2018-05-27 NOTE — Telephone Encounter (Signed)
Connie Cabrera called and left a message she was at the hospital Thursday because was having pains.States she is still having them and wanted to see if she could be seen here and not have to go back to the hospital.States she called and the lady on the phone has sarcasm and said don't do that in this office. States can't bring daughter to hospital or our office because she is sick with pneumonia. Also states she  Has been out of work last week to take care of her daughter and they won't take note from child's doctor- want a note from Connie Cabrera's doctor that she can come back to work.

## 2018-05-27 NOTE — Telephone Encounter (Signed)
Patient called and stated she needed to change her appointment. She is not able to come on Wednesday due to working. She was needing to come on Tuesdays, or Wednesday the following week. Patient requested to speak with a nurse. I put her on hold, but then she hung up.

## 2018-05-28 ENCOUNTER — Other Ambulatory Visit: Payer: Self-pay

## 2018-05-28 ENCOUNTER — Telehealth: Payer: Self-pay | Admitting: Family Medicine

## 2018-05-28 DIAGNOSIS — O99891 Other specified diseases and conditions complicating pregnancy: Secondary | ICD-10-CM

## 2018-05-28 DIAGNOSIS — M549 Dorsalgia, unspecified: Secondary | ICD-10-CM

## 2018-05-28 DIAGNOSIS — O9989 Other specified diseases and conditions complicating pregnancy, childbirth and the puerperium: Principal | ICD-10-CM

## 2018-05-28 NOTE — Telephone Encounter (Signed)
Attempted to call patient with her PT appt @ [facility]. No answer, Left a detailed message with the appointment information and to give the office a call if she had any questions or concerns. Also left [facility]'s number if she is needing to reschedule.

## 2018-05-29 ENCOUNTER — Encounter: Payer: Self-pay | Admitting: Medical

## 2018-05-29 ENCOUNTER — Encounter: Payer: Self-pay | Admitting: *Deleted

## 2018-05-29 NOTE — Telephone Encounter (Signed)
I called Connie Cabrera and left a message I am returning her call and that we can give her a note. Call office if any other issues. Per chart review do see a PT referral has been made and patient called; also see that patient ob fu has been rescheduled at her request.

## 2018-05-30 ENCOUNTER — Encounter (HOSPITAL_COMMUNITY): Payer: Self-pay

## 2018-05-30 ENCOUNTER — Other Ambulatory Visit: Payer: Self-pay

## 2018-05-30 ENCOUNTER — Inpatient Hospital Stay (HOSPITAL_COMMUNITY)
Admission: AD | Admit: 2018-05-30 | Discharge: 2018-05-30 | Disposition: A | Discharge status: Home / Self Care | Payer: Medicaid Other | Attending: Obstetrics and Gynecology | Admitting: Obstetrics and Gynecology

## 2018-05-30 ENCOUNTER — Telehealth: Payer: Self-pay | Admitting: *Deleted

## 2018-05-30 DIAGNOSIS — O1203 Gestational edema, third trimester: Secondary | ICD-10-CM | POA: Diagnosis not present

## 2018-05-30 DIAGNOSIS — Z87891 Personal history of nicotine dependence: Secondary | ICD-10-CM | POA: Diagnosis not present

## 2018-05-30 DIAGNOSIS — Z3A34 34 weeks gestation of pregnancy: Secondary | ICD-10-CM | POA: Diagnosis not present

## 2018-05-30 DIAGNOSIS — N898 Other specified noninflammatory disorders of vagina: Secondary | ICD-10-CM

## 2018-05-30 DIAGNOSIS — L293 Anogenital pruritus, unspecified: Secondary | ICD-10-CM | POA: Insufficient documentation

## 2018-05-30 DIAGNOSIS — O26893 Other specified pregnancy related conditions, third trimester: Secondary | ICD-10-CM | POA: Insufficient documentation

## 2018-05-30 DIAGNOSIS — R51 Headache: Secondary | ICD-10-CM | POA: Diagnosis not present

## 2018-05-30 DIAGNOSIS — O163 Unspecified maternal hypertension, third trimester: Secondary | ICD-10-CM | POA: Diagnosis present

## 2018-05-30 LAB — URINALYSIS, ROUTINE W REFLEX MICROSCOPIC
Bilirubin Urine: NEGATIVE
Glucose, UA: NEGATIVE mg/dL
Hgb urine dipstick: NEGATIVE
Ketones, ur: NEGATIVE mg/dL
Nitrite: NEGATIVE
Protein, ur: NEGATIVE mg/dL
Specific Gravity, Urine: 1.004 — ABNORMAL LOW (ref 1.005–1.030)
pH: 7 (ref 5.0–8.0)

## 2018-05-30 LAB — WET PREP, GENITAL
Clue Cells Wet Prep HPF POC: NONE SEEN
Sperm: NONE SEEN
Trich, Wet Prep: NONE SEEN
Yeast Wet Prep HPF POC: NONE SEEN

## 2018-05-30 MED ORDER — BUTALBITAL-APAP-CAFFEINE 50-325-40 MG PO TABS
2.0000 | ORAL_TABLET | Freq: Once | ORAL | Status: AC
  Administered 2018-05-30: 2 via ORAL
  Filled 2018-05-30: qty 2

## 2018-05-30 NOTE — Telephone Encounter (Signed)
Received a voice message from this am stating she didn't do anything really different yesterday but her calves got kinda bigger. States usually in a m is gone but this am is harder to walk; states swelling didn't go down.  Wants to know if is normal.  Also states has been having what she thinks is braxton hicks uc's and her vagina is throbbing- wants to know if she should get checked out.

## 2018-05-30 NOTE — Discharge Instructions (Signed)

## 2018-05-30 NOTE — MAU Note (Signed)
Pt states that she called the on call nurse this morning. Pt states she told the nurse her feet were swelling. She states her vagina is throbbing.   She left this information on a voicemail and received a call back at 1400 and they told her that her symptoms were consistent with pre-e and that she needed to come in and be seen.   Pt states she stopped at [facility] and took a blood pressure she states it was high, but she did not write down the numbers.   Pt states she had weird pain right above her vagina on the way to the room that is consistent.
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
## 2018-05-30 NOTE — Telephone Encounter (Signed)
I called Connie Cabrera and she states she has been sitting with her feet propped up and her feet are " Gnormous- swollen like sausages".States her face may be swollen, doesn't think hands are but they hurt. Also reports having to squint to see screen. States baby is moving well.Reports some uc's  And vagina throbbing. I instructed her to go to [facility] MAU for evaulation for possible preeclampsia and / or labor.  She voices understanding.

## 2018-06-05 ENCOUNTER — Telehealth: Payer: Self-pay

## 2018-06-05 ENCOUNTER — Other Ambulatory Visit: Payer: Self-pay

## 2018-06-05 ENCOUNTER — Inpatient Hospital Stay (HOSPITAL_COMMUNITY)
Admission: AD | Admit: 2018-06-05 | Discharge: 2018-06-05 | Disposition: A | Discharge status: Home / Self Care | Payer: Medicaid Other | Source: Ambulatory Visit | Attending: Obstetrics and Gynecology | Admitting: Obstetrics and Gynecology

## 2018-06-05 ENCOUNTER — Encounter (HOSPITAL_COMMUNITY): Payer: Self-pay

## 2018-06-05 DIAGNOSIS — Z0371 Encounter for suspected problem with amniotic cavity and membrane ruled out: Secondary | ICD-10-CM | POA: Diagnosis not present

## 2018-06-05 DIAGNOSIS — O4703 False labor before 37 completed weeks of gestation, third trimester: Secondary | ICD-10-CM | POA: Diagnosis not present

## 2018-06-05 DIAGNOSIS — Z3A35 35 weeks gestation of pregnancy: Secondary | ICD-10-CM | POA: Diagnosis not present

## 2018-06-05 DIAGNOSIS — O368130 Decreased fetal movements, third trimester, not applicable or unspecified: Secondary | ICD-10-CM | POA: Diagnosis not present

## 2018-06-05 DIAGNOSIS — F3189 Other bipolar disorder: Secondary | ICD-10-CM | POA: Diagnosis not present

## 2018-06-05 DIAGNOSIS — Z87891 Personal history of nicotine dependence: Secondary | ICD-10-CM | POA: Insufficient documentation

## 2018-06-05 DIAGNOSIS — O479 False labor, unspecified: Secondary | ICD-10-CM

## 2018-06-05 LAB — URINALYSIS, ROUTINE W REFLEX MICROSCOPIC
Bilirubin Urine: NEGATIVE
Glucose, UA: NEGATIVE mg/dL
Hgb urine dipstick: NEGATIVE
Ketones, ur: NEGATIVE mg/dL
Leukocytes,Ua: NEGATIVE
Nitrite: NEGATIVE
Protein, ur: 30 mg/dL — AB
Specific Gravity, Urine: 1.031 — ABNORMAL HIGH (ref 1.005–1.030)
pH: 5 (ref 5.0–8.0)

## 2018-06-05 LAB — AMNISURE RUPTURE OF MEMBRANE (ROM) NOT AT ARMC: Amnisure ROM: NEGATIVE

## 2018-06-05 NOTE — Telephone Encounter (Signed)
Pt called and stated that she was having a tight burning sensation and wanted to know if it was normal.  Called pt and pt informed me that she was currently at the hospital.  I advised pt that if she has any concerns to please give [place] a call.
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
## 2018-06-05 NOTE — MAU Note (Signed)
Pt reports 4 ctx in 1 hour that she rates 8/10 in pain.  Pt also reports she felt "trickle of fluid when having a bowel movement" earlier this morning.  Pt denies vag bleeding.  Pt reports some decreased fetal movement as well.

## 2018-06-05 NOTE — Discharge Instructions (Signed)

## 2018-06-12 ENCOUNTER — Other Ambulatory Visit: Payer: Self-pay

## 2018-06-12 ENCOUNTER — Encounter: Payer: Self-pay | Admitting: Medical

## 2018-06-12 ENCOUNTER — Ambulatory Visit (INDEPENDENT_AMBULATORY_CARE_PROVIDER_SITE_OTHER): Payer: Medicaid Other | Admitting: Medical

## 2018-06-12 ENCOUNTER — Other Ambulatory Visit (HOSPITAL_COMMUNITY)
Admission: RE | Admit: 2018-06-12 | Discharge: 2018-06-12 | Disposition: A | Discharge status: Home / Self Care | Payer: Medicaid Other | Source: Ambulatory Visit | Attending: Medical | Admitting: Medical

## 2018-06-12 ENCOUNTER — Ambulatory Visit: Payer: Medicaid Other | Admitting: Physical Therapy

## 2018-06-12 DIAGNOSIS — R87619 Unspecified abnormal cytological findings in specimens from cervix uteri: Secondary | ICD-10-CM | POA: Insufficient documentation

## 2018-06-12 DIAGNOSIS — N870 Mild cervical dysplasia: Secondary | ICD-10-CM | POA: Insufficient documentation

## 2018-06-12 DIAGNOSIS — Z3A36 36 weeks gestation of pregnancy: Secondary | ICD-10-CM

## 2018-06-12 DIAGNOSIS — Z3480 Encounter for supervision of other normal pregnancy, unspecified trimester: Secondary | ICD-10-CM | POA: Diagnosis not present

## 2018-06-12 DIAGNOSIS — Z3483 Encounter for supervision of other normal pregnancy, third trimester: Secondary | ICD-10-CM

## 2018-06-12 NOTE — Patient Instructions (Addendum)
Fetal Movement Counts Patient Name: ________________________________________________ Patient Due Date: ____________________ What is a fetal movement count?  A fetal movement count is the number of times that you feel your baby move during a certain amount of time. This may also be called a fetal kick count. A fetal movement count is recommended for every pregnant woman. You may be asked to start counting fetal movements as early as week 28 of your pregnancy. Pay attention to when your baby is most active. You may notice your baby's sleep and wake cycles. You may also notice things that make your baby move more. You should do a fetal movement count:  When your baby is normally most active.  At the same time each day. A good time to count movements is while you are resting, after having something to eat and drink. How do I count fetal movements? 1. Find a quiet, comfortable area. Sit, or lie down on your side. 2. Write down the date, the start time and stop time, and the number of movements that you felt between those two times. Take this information with you to your health care visits. 3. For 2 hours, count kicks, flutters, swishes, rolls, and jabs. You should feel at least 10 movements during 2 hours. 4. You may stop counting after you have felt 10 movements. 5. If you do not feel 10 movements in 2 hours, have something to eat and drink. Then, keep resting and counting for 1 hour. If you feel at least 4 movements during that hour, you may stop counting. Contact a health care provider if:  You feel fewer than 4 movements in 2 hours.  Your baby is not moving like he or she usually does. Date: ____________ Start time: ____________ Stop time: ____________ Movements: ____________ Date: ____________ Start time: ____________ Stop time: ____________ Movements: ____________ Date: ____________ Start time: ____________ Stop time: ____________ Movements: ____________ Date: ____________ Start time:  ____________ Stop time: ____________ Movements: ____________ Date: ____________ Start time: ____________ Stop time: ____________ Movements: ____________ Date: ____________ Start time: ____________ Stop time: ____________ Movements: ____________ Date: ____________ Start time: ____________ Stop time: ____________ Movements: ____________ Date: ____________ Start time: ____________ Stop time: ____________ Movements: ____________ Date: ____________ Start time: ____________ Stop time: ____________ Movements: ____________ This information is not intended to replace advice given to you by your health care provider. Make sure you discuss any questions you have with your health care provider. Document Released: 04/12/2006 Document Revised: 11/10/2015 Document Reviewed: 04/22/2015 Elsevier Interactive Patient Education  2019 [employer] Braxton Hicks Contractions Contractions of the uterus can occur throughout pregnancy, but they are not always a sign that you are in labor. You may have practice contractions called Braxton Hicks contractions. These false labor contractions are sometimes confused with true labor. What are Braxton Hicks contractions? Braxton Hicks contractions are tightening movements that occur in the muscles of the uterus before labor. Unlike true labor contractions, these contractions do not result in opening (dilation) and thinning of the cervix. Toward the end of pregnancy (32-34 weeks), Braxton Hicks contractions can happen more often and may become stronger. These contractions are sometimes difficult to tell apart from true labor because they can be very uncomfortable. You should not feel embarrassed if you go to the hospital with false labor. Sometimes, the only way to tell if you are in true labor is for your health care provider to look for changes in the cervix. The health care provider will do a physical exam and may monitor your contractions. If   you are not in true labor, the exam  should show that your cervix is not dilating and your water has not broken. If there are no other health problems associated with your pregnancy, it is completely safe for you to be sent home with false labor. You may continue to have Braxton Hicks contractions until you go into true labor. How to tell the difference between true labor and false labor True labor  Contractions last 30-70 seconds.  Contractions become very regular.  Discomfort is usually felt in the top of the uterus, and it spreads to the lower abdomen and low back.  Contractions do not go away with walking.  Contractions usually become more intense and increase in frequency.  The cervix dilates and gets thinner. False labor  Contractions are usually shorter and not as strong as true labor contractions.  Contractions are usually irregular.  Contractions are often felt in the front of the lower abdomen and in the groin.  Contractions may go away when you walk around or change positions while lying down.  Contractions get weaker and are shorter-lasting as time goes on.  The cervix usually does not dilate or become thin. Follow these instructions at home:   Take over-the-counter and prescription medicines only as told by your health care provider.  Keep up with your usual exercises and follow other instructions from your health care provider.  Eat and drink lightly if you think you are going into labor.  If Braxton Hicks contractions are making you uncomfortable: ? Change your position from lying down or resting to walking, or change from walking to resting. ? Sit and rest in a tub of warm water. ? Drink enough fluid to keep your urine pale yellow. Dehydration may cause these contractions. ? Do slow and deep breathing several times an hour.  Keep all follow-up prenatal visits as told by your health care provider. This is important. Contact a health care provider if:  You have a fever.  You have continuous  pain in your abdomen. Get help right away if:  Your contractions become stronger, more regular, and closer together.  You have fluid leaking or gushing from your vagina.  You pass blood-tinged mucus (bloody show).  You have bleeding from your vagina.  You have low back pain that you never had before.  You feel your baby's head pushing down and causing pelvic pressure.  Your baby is not moving inside you as much as it used to. Summary  Contractions that occur before labor are called Braxton Hicks contractions, false labor, or practice contractions.  Braxton Hicks contractions are usually shorter, weaker, farther apart, and less regular than true labor contractions. True labor contractions usually become progressively stronger and regular, and they become more frequent.  Manage discomfort from Braxton Hicks contractions by changing position, resting in a warm bath, drinking plenty of water, or practicing deep breathing. This information is not intended to replace advice given to you by your health care provider. Make sure you discuss any questions you have with your health care provider. Document Released: 07/27/2016 Document Revised: 12/26/2016 Document Reviewed: 07/27/2016 Elsevier Interactive Patient Education  2019 [employer]  
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
## 2018-06-15 LAB — CERVICOVAGINAL ANCILLARY ONLY
Chlamydia: NEGATIVE
Neisseria Gonorrhea: NEGATIVE

## 2018-06-17 LAB — CULTURE, BETA STREP (GROUP B ONLY): Strep Gp B Culture: NEGATIVE

## 2018-06-18 ENCOUNTER — Encounter: Payer: Self-pay | Admitting: *Deleted

## 2018-06-24 ENCOUNTER — Ambulatory Visit (INDEPENDENT_AMBULATORY_CARE_PROVIDER_SITE_OTHER): Payer: Medicaid Other | Admitting: Student

## 2018-06-24 ENCOUNTER — Other Ambulatory Visit: Payer: Self-pay

## 2018-06-24 VITALS — BP 109/73 | HR 88 | Temp 98.0°F | Wt 205.5 lb

## 2018-06-24 DIAGNOSIS — Z3A37 37 weeks gestation of pregnancy: Secondary | ICD-10-CM

## 2018-06-24 DIAGNOSIS — Z3480 Encounter for supervision of other normal pregnancy, unspecified trimester: Secondary | ICD-10-CM

## 2018-06-24 DIAGNOSIS — Z3483 Encounter for supervision of other normal pregnancy, third trimester: Secondary | ICD-10-CM
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
## 2018-06-24 NOTE — Patient Instructions (Signed)

## 2018-06-25 ENCOUNTER — Telehealth: Payer: Self-pay

## 2018-06-25 NOTE — Telephone Encounter (Signed)
Contacted pt about her LOA.  Per Dorathy Kinsman, CNM pt can be taken out of work starting today, 06/25/18.  Notified pt and pt was very thankful and had no further questions.

## 2018-06-29 ENCOUNTER — Encounter (HOSPITAL_COMMUNITY): Payer: Self-pay

## 2018-06-29 ENCOUNTER — Other Ambulatory Visit: Payer: Self-pay

## 2018-06-29 ENCOUNTER — Inpatient Hospital Stay (HOSPITAL_COMMUNITY)
Admission: AD | Admit: 2018-06-29 | Discharge: 2018-07-01 | DRG: 807 | Disposition: A | Discharge status: Home / Self Care | Payer: Medicaid Other | Attending: Obstetrics & Gynecology | Admitting: Obstetrics & Gynecology

## 2018-06-29 DIAGNOSIS — Z87891 Personal history of nicotine dependence: Secondary | ICD-10-CM

## 2018-06-29 DIAGNOSIS — J45909 Unspecified asthma, uncomplicated: Secondary | ICD-10-CM | POA: Diagnosis present

## 2018-06-29 DIAGNOSIS — Z62810 Personal history of physical and sexual abuse in childhood: Secondary | ICD-10-CM

## 2018-06-29 DIAGNOSIS — Z3A38 38 weeks gestation of pregnancy: Secondary | ICD-10-CM

## 2018-06-29 DIAGNOSIS — O9952 Diseases of the respiratory system complicating childbirth: Secondary | ICD-10-CM | POA: Diagnosis present

## 2018-06-29 DIAGNOSIS — O26893 Other specified pregnancy related conditions, third trimester: Secondary | ICD-10-CM | POA: Diagnosis present

## 2018-06-29 DIAGNOSIS — F319 Bipolar disorder, unspecified: Secondary | ICD-10-CM | POA: Diagnosis present

## 2018-06-29 DIAGNOSIS — O99344 Other mental disorders complicating childbirth: Secondary | ICD-10-CM | POA: Diagnosis present

## 2018-06-29 DIAGNOSIS — N870 Mild cervical dysplasia: Secondary | ICD-10-CM | POA: Diagnosis present

## 2018-06-29 DIAGNOSIS — Z3480 Encounter for supervision of other normal pregnancy, unspecified trimester: Secondary | ICD-10-CM

## 2018-06-29 DIAGNOSIS — R87619 Unspecified abnormal cytological findings in specimens from cervix uteri: Secondary | ICD-10-CM | POA: Diagnosis present

## 2018-06-29 LAB — TYPE AND SCREEN
ABO/RH(D): A POS
Antibody Screen: NEGATIVE

## 2018-06-29 LAB — CBC
HCT: 36.6 % (ref 36.0–46.0)
Hemoglobin: 11.7 g/dL — ABNORMAL LOW (ref 12.0–15.0)
MCH: 27.7 pg (ref 26.0–34.0)
MCHC: 32 g/dL (ref 30.0–36.0)
MCV: 86.5 fL (ref 80.0–100.0)
Platelets: 208 10*3/uL (ref 150–400)
RBC: 4.23 MIL/uL (ref 3.87–5.11)
RDW: 13.8 % (ref 11.5–15.5)
WBC: 13.1 10*3/uL — ABNORMAL HIGH (ref 4.0–10.5)
nRBC: 0 % (ref 0.0–0.2)

## 2018-06-29 LAB — RPR: RPR Ser Ql: NONREACTIVE

## 2018-06-29 LAB — POCT FERN TEST: POCT Fern Test: POSITIVE

## 2018-06-29 MED ORDER — OXYTOCIN BOLUS FROM INFUSION
500.0000 mL | Freq: Once | INTRAVENOUS | Status: DC
  Administered 2018-06-29: 500 mL via INTRAVENOUS

## 2018-06-29 MED ORDER — BENZOCAINE-MENTHOL 20-0.5 % EX AERO
1.0000 "application " | INHALATION_SPRAY | CUTANEOUS | Status: DC | PRN
  Administered 2018-06-29: 1 via TOPICAL
  Filled 2018-06-29: qty 56

## 2018-06-29 MED ORDER — EPHEDRINE 5 MG/ML INJ: 10.0000 mg | INTRAVENOUS | Status: DC | PRN

## 2018-06-29 MED ORDER — DIPHENHYDRAMINE HCL 50 MG/ML IJ SOLN: 12.5000 mg | INTRAMUSCULAR | Status: DC | PRN

## 2018-06-29 MED ORDER — SENNOSIDES-DOCUSATE SODIUM 8.6-50 MG PO TABS
2.0000 | ORAL_TABLET | ORAL | Status: DC
  Administered 2018-06-29 – 2018-07-01 (×2): 2 via ORAL
  Filled 2018-06-29 (×2): qty 2

## 2018-06-29 MED ORDER — LACTATED RINGERS IV SOLN: INTRAVENOUS | Status: DC

## 2018-06-29 MED ORDER — PHENYLEPHRINE 40 MCG/ML (10ML) SYRINGE FOR IV PUSH (FOR BLOOD PRESSURE SUPPORT): 80.0000 ug | PREFILLED_SYRINGE | INTRAVENOUS | Status: DC | PRN

## 2018-06-29 MED ORDER — PRENATAL MULTIVITAMIN CH
1.0000 | ORAL_TABLET | Freq: Every day | ORAL | Status: DC
  Administered 2018-06-29 – 2018-07-01 (×3): 1 via ORAL
  Filled 2018-06-29 (×3): qty 1

## 2018-06-29 MED ORDER — OXYTOCIN 40 UNITS IN NORMAL SALINE INFUSION - SIMPLE MED
2.5000 [IU]/h | INTRAVENOUS | Status: DC
  Filled 2018-06-29: qty 1000

## 2018-06-29 MED ORDER — SOD CITRATE-CITRIC ACID 500-334 MG/5ML PO SOLN: 30.0000 mL | ORAL | Status: DC | PRN

## 2018-06-29 MED ORDER — DIPHENHYDRAMINE HCL 25 MG PO CAPS: 25.0000 mg | ORAL_CAPSULE | Freq: Four times a day (QID) | ORAL | Status: DC | PRN

## 2018-06-29 MED ORDER — PHENYLEPHRINE 40 MCG/ML (10ML) SYRINGE FOR IV PUSH (FOR BLOOD PRESSURE SUPPORT)
80.0000 ug | PREFILLED_SYRINGE | INTRAVENOUS | Status: DC | PRN
  Filled 2018-06-29: qty 10

## 2018-06-29 MED ORDER — FENTANYL CITRATE (PF) 100 MCG/2ML IJ SOLN: 100.0000 ug | INTRAMUSCULAR | Status: DC | PRN

## 2018-06-29 MED ORDER — LACTATED RINGERS IV SOLN: 500.0000 mL | INTRAVENOUS | Status: DC | PRN

## 2018-06-29 MED ORDER — IBUPROFEN 600 MG PO TABS
600.0000 mg | ORAL_TABLET | Freq: Four times a day (QID) | ORAL | Status: DC
  Administered 2018-06-29 – 2018-07-01 (×10): 600 mg via ORAL
  Filled 2018-06-29 (×10): qty 1

## 2018-06-29 MED ORDER — SIMETHICONE 80 MG PO CHEW: 80.0000 mg | CHEWABLE_TABLET | ORAL | Status: DC | PRN

## 2018-06-29 MED ORDER — FENTANYL-BUPIVACAINE-NACL 0.5-0.125-0.9 MG/250ML-% EP SOLN
12.0000 mL/h | EPIDURAL | Status: DC | PRN
  Filled 2018-06-29: qty 250

## 2018-06-29 MED ORDER — LIDOCAINE HCL (PF) 1 % IJ SOLN
30.0000 mL | INTRAMUSCULAR | Status: DC | PRN
  Filled 2018-06-29: qty 30

## 2018-06-29 MED ORDER — OXYCODONE HCL 5 MG PO TABS: 5.0000 mg | ORAL_TABLET | ORAL | Status: DC | PRN

## 2018-06-29 MED ORDER — TETANUS-DIPHTH-ACELL PERTUSSIS 5-2.5-18.5 LF-MCG/0.5 IM SUSP: 0.5000 mL | Freq: Once | INTRAMUSCULAR | Status: DC

## 2018-06-29 MED ORDER — ACETAMINOPHEN 325 MG PO TABS: 650.0000 mg | ORAL_TABLET | ORAL | Status: DC | PRN

## 2018-06-29 MED ORDER — DIBUCAINE 1 % RE OINT
1.0000 "application " | TOPICAL_OINTMENT | RECTAL | Status: DC | PRN
  Administered 2018-06-30: 1 via RECTAL
  Filled 2018-06-29: qty 28

## 2018-06-29 MED ORDER — ONDANSETRON HCL 4 MG/2ML IJ SOLN: 4.0000 mg | INTRAMUSCULAR | Status: DC | PRN

## 2018-06-29 MED ORDER — WITCH HAZEL-GLYCERIN EX PADS
1.0000 "application " | MEDICATED_PAD | CUTANEOUS | Status: DC | PRN
  Administered 2018-06-30: 1 via TOPICAL

## 2018-06-29 MED ORDER — COCONUT OIL OIL: 1.0000 "application " | TOPICAL_OIL | Status: DC | PRN

## 2018-06-29 MED ORDER — FLEET ENEMA 7-19 GM/118ML RE ENEM: 1.0000 | ENEMA | RECTAL | Status: DC | PRN

## 2018-06-29 MED ORDER — LACTATED RINGERS IV SOLN
500.0000 mL | Freq: Once | INTRAVENOUS | Status: AC
  Administered 2018-06-29: 500 mL via INTRAVENOUS

## 2018-06-29 MED ORDER — ZOLPIDEM TARTRATE 5 MG PO TABS: 5.0000 mg | ORAL_TABLET | Freq: Every evening | ORAL | Status: DC | PRN

## 2018-06-29 MED ORDER — ONDANSETRON HCL 4 MG/2ML IJ SOLN: 4.0000 mg | Freq: Four times a day (QID) | INTRAMUSCULAR | Status: DC | PRN

## 2018-06-29 MED ORDER — ONDANSETRON HCL 4 MG PO TABS: 4.0000 mg | ORAL_TABLET | ORAL | Status: DC | PRN

## 2018-06-29 NOTE — Lactation Note (Signed)
This note was copied from a baby's chart. Lactation Consultation Note  Patient Name: Connie Cabrera [id number] Date: 06/29/2018 Reason for consult: Initial assessment;Early term 37-38.6wks;1st time breastfeeding  P2 mother whose infant is now 25 hours old.  This is mother's first time breast feeding.  Baby was asleep on mother's chest when I arrived and not showing feeding cues.  Mother has fed him a couple of drops of colostrum since delivery.  Encouraged to feed 8-12 times/24 hours or sooner if baby shows feeding cues.  Reviewed cues.  She is familiar with hand expression and will feed back any EBM she obtains with hand expression.  Colostrum container provided and milk storage times reviewed.  Finger feeding demonstrated.    Per mother her nipples are inverted and night shift RN had already provided breast shells and a manual pump.  Mother has no questions on usage of these products.  Encouraged to call for lactation assistance as needed.  Mother will work from home after maternity leave.  She has no immediate needs for a DEBP.  RN in room.   Maternal Data Formula Feeding for Exclusion: No Has patient been taught Hand Expression?: Yes Does the patient have breastfeeding experience prior to this delivery?: No  Feeding    LATCH Score                   Interventions    Lactation Tools Discussed/Used     Consult Status Consult Status: Follow-up Date: 06/30/18 Follow-up type: In-patient    Jesstin Studstill R Milyn Stapleton 06/29/2018, 12:50 PM
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
## 2018-06-29 NOTE — MAU Note (Signed)
Woke up at 0230 and SROM clear fluid. Cntx got stronger after that- about 6 mins apart. Denies bleeding. +FM
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
## 2018-06-30 ENCOUNTER — Encounter (HOSPITAL_COMMUNITY): Payer: Self-pay | Admitting: *Deleted

## 2018-06-30 MED ORDER — IBUPROFEN 800 MG PO TABS: 800.0000 mg | ORAL_TABLET | Freq: Three times a day (TID) | ORAL | 0 refills | Status: DC

## 2018-06-30 MED ORDER — SENNOSIDES-DOCUSATE SODIUM 8.6-50 MG PO TABS: 2.0000 | ORAL_TABLET | Freq: Every evening | ORAL | Status: DC | PRN

## 2018-06-30 NOTE — Progress Notes (Signed)
Mom stated her hemorrhoids were bothering her.  Brought in tucks/witch hazel pads and cream.  Explained to her how to use; offered to help her apply, she refused.  Mom voiced understanding what to do.
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
## 2018-06-30 NOTE — Lactation Note (Signed)
This note was copied from a baby's chart. Lactation Consultation Note  Patient Name: Connie Cabrera Date: 06/30/2018  Mom holding baby on arrival. Inquired how breastfeeding was going.  Mom reports her nipples are sore.  Mom reports she tried to pump with DEBP and they were too sore.  Asked mom what I could do to help her.  Mom reports they are fine.  Urged her to call lactation as needed   Maternal Data    Feeding    LATCH Score                   Interventions    Lactation Tools Discussed/Used     Consult Status      Neomia Dear 06/30/2018, 6:45 PM

## 2018-07-01 ENCOUNTER — Encounter: Payer: Self-pay | Admitting: Advanced Practice Midwife

## 2018-07-01 NOTE — Progress Notes (Signed)
Patient ID: Connie Cabrera, female   DOB: 09-22-1990, 28 y.o.   MRN: [id number] Attending Circumcision Counseling Progress Note  Patient desires circumcision for her female infant.  Circumcision procedure details discussed, risks and benefits of procedure were also discussed.  These include but are not limited to: Benefits of circumcision in men include reduction in the rates of urinary tract infection (UTI), penile cancer, some sexually transmitted infections, penile inflammatory and retractile disorders, as well as easier hygiene.  Risks include bleeding , infection, injury of glans which may lead to penile deformity or urinary tract issues, unsatisfactory cosmetic appearance and other potential complications related to the procedure.  It was emphasized that this is an elective procedure.  Patient wants to proceed with circumcision; written informed consent obtained.  Will do circumcision soon, routine circumcision and post circumcision care ordered for the infant.  Michael L. Alysia Penna, M.D. 07/01/2018 11:51 AM
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
## 2018-07-01 NOTE — Progress Notes (Signed)
Patient would not let me do a fundal assessment

## 2018-07-08 ENCOUNTER — Telehealth: Payer: Self-pay | Admitting: Obstetrics & Gynecology

## 2018-07-08 ENCOUNTER — Encounter: Payer: Self-pay | Admitting: Advanced Practice Midwife

## 2018-07-08 NOTE — Telephone Encounter (Signed)
Reached out to the patient via the available contact number in Epic. Received a message -"The mailbox is full and can not receive any messages at this time." Sending the patient a message via the

## 2018-07-25 ENCOUNTER — Telehealth: Payer: Self-pay | Admitting: Family Medicine

## 2018-07-25 NOTE — Telephone Encounter (Signed)
Attempted to call patient to let her know her paperwork has been completed. No answer, left voicemail with the above information.

## 2018-07-30 ENCOUNTER — Telehealth: Payer: Self-pay | Admitting: Emergency Medicine

## 2018-07-30 NOTE — Telephone Encounter (Signed)
Pt called nurse voicemail line stating that she received the message stating her FMLA paperwork was complete. Pt states that she called her employer and they stated that they have not received her FMLA paperwork. Pt requested paperwork be faxed to [phone]. Message sent to [facility] for advisement.

## 2018-08-09 ENCOUNTER — Telehealth: Payer: Self-pay | Admitting: Obstetrics and Gynecology

## 2018-08-09 NOTE — Telephone Encounter (Signed)
Called the patient to inform of upcoming appointment. Left a detailed voicemail regarding the mychart visit. °

## 2018-08-12 ENCOUNTER — Ambulatory Visit: Payer: Self-pay | Admitting: Obstetrics and Gynecology

## 2018-08-12 ENCOUNTER — Ambulatory Visit: Payer: Medicaid Other | Admitting: Obstetrics and Gynecology

## 2018-08-12 ENCOUNTER — Telehealth: Payer: Self-pay | Admitting: Obstetrics and Gynecology

## 2018-08-12 ENCOUNTER — Other Ambulatory Visit: Payer: Self-pay

## 2018-08-12 NOTE — Progress Notes (Signed)
Called Pt for PP virtual visit , no answer, left VM that I will call her back in 10 to 15 mins.   Spoke with pt she stated that she is not in any Position to do a virtual PP visit, she has her kids & nephews and doesn't want to do it.

## 2018-08-12 NOTE — Telephone Encounter (Signed)
Called the patient to inform of missed appointment. She stated she does not feel comfortable doing a visit over the phone or virtually with a baby in the background. She stated she is coming in Friday and if she needs to be seen she will schedule an appointment at that time.

## 2018-08-13 ENCOUNTER — Encounter: Payer: Self-pay | Admitting: Obstetrics and Gynecology

## 2018-08-16 ENCOUNTER — Other Ambulatory Visit: Payer: Self-pay

## 2018-08-16 ENCOUNTER — Ambulatory Visit (INDEPENDENT_AMBULATORY_CARE_PROVIDER_SITE_OTHER): Payer: Medicaid Other

## 2018-08-16 ENCOUNTER — Other Ambulatory Visit (HOSPITAL_COMMUNITY)
Admission: RE | Admit: 2018-08-16 | Discharge: 2018-08-16 | Disposition: A | Discharge status: Home / Self Care | Payer: Medicaid Other | Source: Ambulatory Visit | Attending: Obstetrics & Gynecology | Admitting: Obstetrics & Gynecology

## 2018-08-16 DIAGNOSIS — N898 Other specified noninflammatory disorders of vagina: Secondary | ICD-10-CM | POA: Diagnosis present

## 2018-08-16 NOTE — Progress Notes (Signed)
I have reviewed the chart and agree with nursing staff's documentation of this patient's encounter.  Briyan Kleven, MD 08/16/2018 1:27 PM    

## 2018-08-16 NOTE — Progress Notes (Signed)
Pt here today because she tested positive for Trich 4 months ago and had unprotected sex with her partner who has not f/u with tx.  Pt requests that she wants to be tested for STI and pregnancy.  I asked pt what her concerns were for pregnancy.  Pt was hesitant and frustrated that I asked her the question.  Jasmine December then asked when did she deliver because it could be to early to test for pregnancy. Pt stated then that she will wait because she had had sex three and half weeks ago.  Pt explained on how to obtain self swab and that results we will call with abnormal results.  Pt stated understanding.

## 2018-08-20 LAB — CERVICOVAGINAL ANCILLARY ONLY
Bacterial vaginitis: NEGATIVE
Candida vaginitis: NEGATIVE
Chlamydia: NEGATIVE
Neisseria Gonorrhea: NEGATIVE
Trichomonas: NEGATIVE

## 2018-09-11 ENCOUNTER — Telehealth: Payer: Self-pay | Admitting: *Deleted

## 2018-09-11 NOTE — Telephone Encounter (Signed)
Connie Cabrera left a message this afternoon that she is getting ready to come off LOA and her job is just now sending her paperwork to have her released . States needs it done even though we have done other paperwork. States she can't print it off and wants to know if she can email it. Would like a call to discuss what she needs to do and if she will have to pay again.

## 2018-09-23 DIAGNOSIS — Z30011 Encounter for initial prescription of contraceptive pills: Secondary | ICD-10-CM

## 2018-09-26 MED ORDER — NORGESTIMATE-ETH ESTRADIOL 0.25-35 MG-MCG PO TABS: 1.0000 | ORAL_TABLET | Freq: Every day | ORAL | 5 refills | Status: DC

## 2018-12-03 ENCOUNTER — Telehealth: Payer: Self-pay | Admitting: Family Medicine

## 2018-12-03 NOTE — Telephone Encounter (Signed)
Attempted to call patient about her appointment on 9/9 @ 9:20. No answer and could not leave a voicemail because it was full.

## 2018-12-04 ENCOUNTER — Encounter: Payer: Self-pay | Admitting: Family Medicine

## 2018-12-04 ENCOUNTER — Ambulatory Visit: Payer: Medicaid Other

## 2018-12-06 ENCOUNTER — Inpatient Hospital Stay (HOSPITAL_COMMUNITY)
Admission: AD | Admit: 2018-12-06 | Discharge: 2018-12-06 | Disposition: A | Discharge status: Home / Self Care | Payer: No Typology Code available for payment source | Attending: Obstetrics and Gynecology | Admitting: Obstetrics and Gynecology

## 2018-12-06 ENCOUNTER — Encounter (HOSPITAL_COMMUNITY): Payer: Self-pay | Admitting: *Deleted

## 2018-12-06 ENCOUNTER — Inpatient Hospital Stay (HOSPITAL_COMMUNITY): Payer: No Typology Code available for payment source

## 2018-12-06 ENCOUNTER — Other Ambulatory Visit: Payer: Self-pay

## 2018-12-06 DIAGNOSIS — Z3A10 10 weeks gestation of pregnancy: Secondary | ICD-10-CM | POA: Diagnosis not present

## 2018-12-06 DIAGNOSIS — Z87891 Personal history of nicotine dependence: Secondary | ICD-10-CM | POA: Diagnosis not present

## 2018-12-06 DIAGNOSIS — O034 Incomplete spontaneous abortion without complication: Secondary | ICD-10-CM | POA: Diagnosis present

## 2018-12-06 DIAGNOSIS — N939 Abnormal uterine and vaginal bleeding, unspecified: Secondary | ICD-10-CM

## 2018-12-06 LAB — CBC WITH DIFFERENTIAL/PLATELET
Abs Immature Granulocytes: 0.03 10*3/uL (ref 0.00–0.07)
Basophils Absolute: 0 10*3/uL (ref 0.0–0.1)
Basophils Relative: 0 %
Eosinophils Absolute: 0 10*3/uL (ref 0.0–0.5)
Eosinophils Relative: 1 %
HCT: 35.4 % — ABNORMAL LOW (ref 36.0–46.0)
Hemoglobin: 11.4 g/dL — ABNORMAL LOW (ref 12.0–15.0)
Immature Granulocytes: 0 %
Lymphocytes Relative: 32 %
Lymphs Abs: 2.4 10*3/uL (ref 0.7–4.0)
MCH: 29.7 pg (ref 26.0–34.0)
MCHC: 32.2 g/dL (ref 30.0–36.0)
MCV: 92.2 fL (ref 80.0–100.0)
Monocytes Absolute: 0.4 10*3/uL (ref 0.1–1.0)
Monocytes Relative: 6 %
Neutro Abs: 4.4 10*3/uL (ref 1.7–7.7)
Neutrophils Relative %: 61 %
Platelets: 212 10*3/uL (ref 150–400)
RBC: 3.84 MIL/uL — ABNORMAL LOW (ref 3.87–5.11)
RDW: 12.8 % (ref 11.5–15.5)
WBC: 7.3 10*3/uL (ref 4.0–10.5)
nRBC: 0 % (ref 0.0–0.2)

## 2018-12-06 LAB — HCG, QUANTITATIVE, PREGNANCY: hCG, Beta Chain, Quant, S: 1501 m[IU]/mL — ABNORMAL HIGH (ref <5)

## 2018-12-06 LAB — POCT PREGNANCY, URINE: Preg Test, Ur: POSITIVE — AB

## 2018-12-06 MED ORDER — ONDANSETRON HCL 4 MG/2ML IJ SOLN
4.0000 mg | Freq: Once | INTRAMUSCULAR | Status: AC
  Administered 2018-12-06: 4 mg via INTRAVENOUS
  Filled 2018-12-06: qty 2

## 2018-12-06 MED ORDER — LACTATED RINGERS IV SOLN
INTRAVENOUS | Status: DC
  Administered 2018-12-06: 16:00:00 via INTRAVENOUS

## 2018-12-06 MED ORDER — KETOROLAC TROMETHAMINE 30 MG/ML IJ SOLN
30.0000 mg | Freq: Once | INTRAMUSCULAR | Status: AC
  Administered 2018-12-06: 20:00:00 30 mg via INTRAVENOUS
  Filled 2018-12-06: qty 1

## 2018-12-06 MED ORDER — HYDROMORPHONE HCL 1 MG/ML IJ SOLN
1.0000 mg | Freq: Once | INTRAMUSCULAR | Status: AC
  Administered 2018-12-06: 16:00:00 1 mg via INTRAVENOUS
  Filled 2018-12-06: qty 1

## 2018-12-06 MED ORDER — MISOPROSTOL 200 MCG PO TABS
800.0000 ug | ORAL_TABLET | Freq: Once | ORAL | Status: AC
  Administered 2018-12-06: 19:00:00 800 ug via BUCCAL
  Filled 2018-12-06: qty 4

## 2018-12-06 MED ORDER — ONDANSETRON 8 MG PO TBDP: 8.0000 mg | ORAL_TABLET | Freq: Three times a day (TID) | ORAL | 0 refills | Status: DC | PRN

## 2018-12-06 MED ORDER — HYDROMORPHONE HCL 1 MG/ML IJ SOLN: 1.0000 mg | Freq: Once | INTRAMUSCULAR | Status: DC

## 2018-12-06 MED ORDER — HYDROCODONE-ACETAMINOPHEN 5-325 MG PO TABS: 2.0000 | ORAL_TABLET | ORAL | 0 refills | Status: AC | PRN

## 2018-12-06 NOTE — MAU Note (Signed)
.   Connie Cabrera is a 28 y.o. at Unknown here in MAU reporting: heavy  Vaginal bleeding with clots. States she took cyctotec to terminate pregnancy on August the 27 and 28. Pt reports pelvic pressure LMP: 09/22/18 Onset of complaint: ongoing with termination Pain score: 5 Vitals:   12/06/18 1450  BP: 104/70  Pulse: 93  Resp: 16  Temp: 98.3 F (36.8 C)     Lab orders placed from triage: UPT
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
## 2018-12-30 ENCOUNTER — Ambulatory Visit: Payer: Medicaid Other | Admitting: Family Medicine

## 2019-01-06 ENCOUNTER — Encounter: Payer: Self-pay | Admitting: *Deleted

## 2019-01-06 NOTE — Progress Notes (Unsigned)
Received fax for 90 day of sprintec. Per chart recent TAB, seen in MAU with complications- to be seen in office for followup. Declined refill Tattiana Fakhouri,RN

## 2019-02-07 ENCOUNTER — Encounter: Payer: Self-pay | Admitting: Emergency Medicine

## 2019-02-07 ENCOUNTER — Other Ambulatory Visit: Payer: Self-pay

## 2019-02-07 ENCOUNTER — Ambulatory Visit
Admission: EM | Admit: 2019-02-07 | Discharge: 2019-02-07 | Disposition: A | Discharge status: Home / Self Care | Payer: 59 | Attending: Emergency Medicine | Admitting: Emergency Medicine

## 2019-02-07 DIAGNOSIS — Z20828 Contact with and (suspected) exposure to other viral communicable diseases: Secondary | ICD-10-CM | POA: Diagnosis not present

## 2019-02-07 DIAGNOSIS — F419 Anxiety disorder, unspecified: Secondary | ICD-10-CM

## 2019-02-07 DIAGNOSIS — R059 Cough, unspecified: Secondary | ICD-10-CM

## 2019-02-07 DIAGNOSIS — R05 Cough: Secondary | ICD-10-CM

## 2019-02-07 DIAGNOSIS — R079 Chest pain, unspecified: Secondary | ICD-10-CM | POA: Diagnosis not present

## 2019-02-07 DIAGNOSIS — Z3202 Encounter for pregnancy test, result negative: Secondary | ICD-10-CM | POA: Diagnosis not present

## 2019-02-07 LAB — POCT URINE PREGNANCY: Preg Test, Ur: NEGATIVE

## 2019-02-07 MED ORDER — HYDROXYZINE HCL 25 MG PO TABS: 25.0000 mg | ORAL_TABLET | Freq: Four times a day (QID) | ORAL | 0 refills | Status: DC

## 2019-02-07 NOTE — Discharge Instructions (Signed)
Your COVID test is pending - it is important to quarantine / isolate at home until your results are back. If you test positive and would like further evaluation for persistent or worsening symptoms, you may schedule an E-visit or virtual (video) visit throughout the [facility] app or website.  PLEASE NOTE: If you develop severe chest pain or shortness of breath please go to the ER or call 9-1-1 for further evaluation --> DO NOT schedule electronic or virtual visits for this. Please call our office for further guidance / recommendations as needed.   Important to go to ER for further evaluation for persistent chest pain, shortness of breath, lightheadedness, nausea, vomiting.

## 2019-02-07 NOTE — ED Triage Notes (Addendum)
Pt presents to [facility] for assessment of upper chest and left and right rib pain since Tuesday night when she laid down.  States she got up and paced around some and was able to fall asleep.  Patient states she then began having constant pressure and sharp pains.  Patient c/o mild cough.  Patient also c/o intermittent sharp headaches lasting short periods of time.
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
## 2019-02-07 NOTE — ED Notes (Signed)
Patient able to ambulate independently  

## 2019-02-10 LAB — NOVEL CORONAVIRUS, NAA: SARS-CoV-2, NAA: NOT DETECTED

## 2019-02-12 ENCOUNTER — Encounter: Payer: Self-pay | Admitting: Cardiology

## 2019-02-12 ENCOUNTER — Ambulatory Visit (INDEPENDENT_AMBULATORY_CARE_PROVIDER_SITE_OTHER): Payer: 59 | Admitting: Cardiology

## 2019-02-12 ENCOUNTER — Other Ambulatory Visit: Payer: Self-pay

## 2019-02-12 VITALS — BP 100/50 | HR 94 | Ht 67.0 in | Wt 171.4 lb

## 2019-02-12 DIAGNOSIS — R079 Chest pain, unspecified: Secondary | ICD-10-CM | POA: Diagnosis not present

## 2019-02-12 DIAGNOSIS — R0602 Shortness of breath: Secondary | ICD-10-CM | POA: Diagnosis not present

## 2019-02-12 LAB — CBC
Hematocrit: 35.1 % (ref 34.0–46.6)
Hemoglobin: 10.8 g/dL — ABNORMAL LOW (ref 11.1–15.9)
MCH: 23.3 pg — ABNORMAL LOW (ref 26.6–33.0)
MCHC: 30.8 g/dL — ABNORMAL LOW (ref 31.5–35.7)
MCV: 76 fL — ABNORMAL LOW (ref 79–97)
Platelets: 224 10*3/uL (ref 150–450)
RBC: 4.63 x10E6/uL (ref 3.77–5.28)
RDW: 16.2 % — ABNORMAL HIGH (ref 11.7–15.4)
WBC: 6.8 10*3/uL (ref 3.4–10.8)

## 2019-02-12 LAB — D-DIMER, QUANTITATIVE: D-DIMER: 0.28 mg/L FEU (ref 0.00–0.49)

## 2019-02-12 LAB — TROPONIN T: Troponin T TROPT: <0.011 ng/mL (ref <0.011)

## 2019-02-12 NOTE — Patient Instructions (Signed)
Medication Instructions:  Your physician recommends that you continue on your current medications as directed. Please refer to the Current Medication list given to you today.  *If you need a refill on your cardiac medications before your next appointment, please call your pharmacy*  Lab Work: TODAY : D-Dimer (Stat), Troponin I (Stat) & CBC  If you have labs (blood work) drawn today and your tests are completely normal, you will receive your results only by: Marland Kitchen MyChart Message (if you have MyChart) OR . A paper copy in the mail If you have any lab test that is abnormal or we need to change your treatment, we will call you to review the results.  Testing/Procedures: Your physician has requested that you have an echocardiogram. Echocardiography is a painless test that uses sound waves to create images of your heart. It provides your doctor with information about the size and shape of your heart and how well your heart's chambers and valves are working. This procedure takes approximately one hour. There are no restrictions for this procedure.  Your physician has requested that you have an exercise tolerance test. For further information please visit [email or web address]. Please also follow instruction sheet, as given.     Follow-Up: Follow up with Pecolia Ades, NP after your test are completed    Other Instructions
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
## 2019-02-13 ENCOUNTER — Telehealth: Payer: Self-pay | Admitting: Cardiology

## 2019-02-13 NOTE — Telephone Encounter (Signed)
Patient is returning call. She requested to be contacted on her 15 minute break at 2pm or her 1 hour lunch between 4-5pm if possible.

## 2019-03-04 ENCOUNTER — Telehealth (HOSPITAL_COMMUNITY): Payer: Self-pay

## 2019-03-04 NOTE — Telephone Encounter (Signed)
Encounter complete. 

## 2019-03-05 ENCOUNTER — Telehealth (HOSPITAL_COMMUNITY): Payer: Self-pay

## 2019-03-05 NOTE — Telephone Encounter (Signed)
Encounter complete. 

## 2019-03-06 ENCOUNTER — Other Ambulatory Visit: Payer: Self-pay

## 2019-03-06 ENCOUNTER — Ambulatory Visit (HOSPITAL_COMMUNITY)
Admission: RE | Admit: 2019-03-06 | Discharge: 2019-03-06 | Disposition: A | Discharge status: Home / Self Care | Payer: 59 | Source: Ambulatory Visit | Attending: Internal Medicine | Admitting: Internal Medicine

## 2019-03-06 ENCOUNTER — Ambulatory Visit (HOSPITAL_BASED_OUTPATIENT_CLINIC_OR_DEPARTMENT_OTHER): Payer: 59

## 2019-03-06 DIAGNOSIS — R079 Chest pain, unspecified: Secondary | ICD-10-CM | POA: Diagnosis present

## 2019-03-06 DIAGNOSIS — R0602 Shortness of breath: Secondary | ICD-10-CM

## 2019-03-06 LAB — EXERCISE TOLERANCE TEST
Estimated workload: 15.3 METS
Exercise duration (min): 13 min
Exercise duration (sec): 0 s
MPHR: 190 {beats}/min
Peak HR: 187 {beats}/min
Percent HR: 98 %
Rest HR: 76 {beats}/min

## 2019-03-06 MED ORDER — PERFLUTREN LIPID MICROSPHERE
1.0000 mL | INTRAVENOUS | Status: AC | PRN
  Administered 2019-03-06: 2 mL via INTRAVENOUS

## 2019-03-28 NOTE — L&D Delivery Note (Signed)
Delivery Note Progressed to complete dilation at  [phone] and pushed very well.   At 8:35 AM a viable and healthy female was delivered via Vaginal, Spontaneous (Presentation: Left Occiput Anterior).  APGAR: 9, 9; weight  .   Placenta status: Spontaneous, Intact.  Cord: 3 vessels with the following complications: None.    Anesthesia: Epidural Episiotomy: None Lacerations: 1st degree Suture Repair: 3-0 Monocryl, 1 suture approximated the laceration Est. Blood Loss (mL): 52  Mom to postpartum.  Baby to Couplet care / Skin to Skin.  Wynelle Bourgeois 03/17/2020, 9:01 AM

## 2019-04-07 ENCOUNTER — Ambulatory Visit: Payer: 59 | Attending: Internal Medicine

## 2019-04-07 DIAGNOSIS — Z20822 Contact with and (suspected) exposure to covid-19: Secondary | ICD-10-CM

## 2019-04-08 LAB — NOVEL CORONAVIRUS, NAA: SARS-CoV-2, NAA: NOT DETECTED

## 2019-08-11 ENCOUNTER — Encounter: Payer: Self-pay | Admitting: Family Medicine

## 2019-08-11 ENCOUNTER — Other Ambulatory Visit: Payer: Self-pay

## 2019-08-11 ENCOUNTER — Ambulatory Visit (INDEPENDENT_AMBULATORY_CARE_PROVIDER_SITE_OTHER): Payer: Medicaid Other

## 2019-08-11 DIAGNOSIS — Z3201 Encounter for pregnancy test, result positive: Secondary | ICD-10-CM | POA: Diagnosis not present

## 2019-08-11 DIAGNOSIS — Z3200 Encounter for pregnancy test, result unknown: Secondary | ICD-10-CM

## 2019-08-11 LAB — POCT PREGNANCY, URINE: Preg Test, Ur: POSITIVE — AB

## 2019-08-11 NOTE — Progress Notes (Signed)
Agree with A & P. 

## 2019-08-11 NOTE — Progress Notes (Signed)
Pt here today for pregnancy confirmation appt. UPT result positive. Pt reports first positive home UPT on 06/18/19. LMP of 05/19/19; pt is 12w today with EDD of 02/23/20. Pt reports some nausea that has resolved in the last 2 days; pt would not like any medication at this time but agrees to call the office if symptoms worsen.   Medications and allergies reviewed with pt; list of medications safe to take during pregnancy given. Pt is taking Nutriburst dietary supplement daily and would like to know if this is safe for pregnancy. Reviewed with Alysia Penna, MD who states none of the ingredients appear harmful, however there is no research to show that this is safe during pregnancy. Alysia Penna, MD does not recommend patient continue this supplement. Reviewed provider recommendation with pt; pt states she plans to continue taking as this supplement has made her feel much better.   Front office notified to schedule new OB appts and to provide proof of pregnancy letter.  Fleet Contras RN 08/11/19

## 2019-09-01 ENCOUNTER — Encounter: Payer: Self-pay | Admitting: *Deleted

## 2019-09-01 ENCOUNTER — Ambulatory Visit (INDEPENDENT_AMBULATORY_CARE_PROVIDER_SITE_OTHER): Payer: Medicaid Other | Admitting: *Deleted

## 2019-09-01 ENCOUNTER — Other Ambulatory Visit: Payer: Self-pay

## 2019-09-01 DIAGNOSIS — Z3A14 14 weeks gestation of pregnancy: Secondary | ICD-10-CM

## 2019-09-01 DIAGNOSIS — Z62810 Personal history of physical and sexual abuse in childhood: Secondary | ICD-10-CM

## 2019-09-01 DIAGNOSIS — O0991 Supervision of high risk pregnancy, unspecified, first trimester: Secondary | ICD-10-CM

## 2019-09-01 DIAGNOSIS — N96 Recurrent pregnancy loss: Secondary | ICD-10-CM

## 2019-09-01 DIAGNOSIS — Z3490 Encounter for supervision of normal pregnancy, unspecified, unspecified trimester: Secondary | ICD-10-CM

## 2019-09-01 DIAGNOSIS — O0990 Supervision of high risk pregnancy, unspecified, unspecified trimester: Secondary | ICD-10-CM | POA: Insufficient documentation

## 2019-09-01 MED ORDER — BLOOD PRESSURE KIT DEVI: 1.0000 | 0 refills | Status: DC | PRN

## 2019-09-01 NOTE — Progress Notes (Signed)
I connected with  Connie Cabrera on 09/01/19 at 11:15 AM EDT by telephone and verified that I am speaking with the correct person using two identifiers.   I discussed the limitations, risks, security and privacy concerns of performing an evaluation and management service by telephone and the availability of in person appointments. I also discussed with the patient that there may be a patient responsible charge related to this service. The patient expressed understanding and agreed to proceed.  I explained I am completing her New OB Intake today. She had her pregnancy confirmed in our office .  We discussed Her EDD and that it is based on  sure LMP .  I reviewed her allergies, meds, OB History, Medical /Surgical history, and appropriate screenings. I informed her of [facility] services.  She did have a positive PHQ9 and GAD 7 and declines referral to [facility]- states she has talked to [place], our [facility] before.  I explained I will send her the [employer] app and app was sent to her while on phone. She did not receive the app and I explained I will contact [employer] to resend.   I explained we will send a blood pressure cuff to [facility] that will fill that prescription and we called [facility] and left a message  to verify they received her prescription and confirmed  she will pick up the cuff.  I asked her to bring the blood pressure cuff with her to her first ob appointment so we can show her how to use it. Explained  then we will have her take her blood pressure weekly and enter into the app.  I explained she will have some visits in office and some virtually. She already has Sports coach. I reviewed her new ob  appointment date/ time with her , our location and to wear mask, no visitors.  I explained she will have a pelvic exam, ob bloodwork, hemoglobin a1C, cbg ,pap, and  genetic testing if desired,- she is undecided about a panorama. I attempted to schedule an [place] at 19 weeks but MFM unavailable - I  explained I will schedule later and send her an appointment.  She voices understanding.   Ameliah Baskins,RN 09/01/2019  11:21 AM

## 2019-09-02 ENCOUNTER — Encounter: Payer: Self-pay | Admitting: Student

## 2019-09-02 ENCOUNTER — Other Ambulatory Visit (HOSPITAL_COMMUNITY)
Admission: RE | Admit: 2019-09-02 | Discharge: 2019-09-02 | Disposition: A | Discharge status: Home / Self Care | Payer: Medicaid Other | Source: Ambulatory Visit | Attending: Student | Admitting: Student

## 2019-09-02 ENCOUNTER — Ambulatory Visit (INDEPENDENT_AMBULATORY_CARE_PROVIDER_SITE_OTHER): Payer: Medicaid Other | Admitting: Student

## 2019-09-02 VITALS — BP 109/67 | HR 92 | Wt 159.1 lb

## 2019-09-02 DIAGNOSIS — Z3A15 15 weeks gestation of pregnancy: Secondary | ICD-10-CM

## 2019-09-02 DIAGNOSIS — O0992 Supervision of high risk pregnancy, unspecified, second trimester: Secondary | ICD-10-CM

## 2019-09-02 DIAGNOSIS — O0990 Supervision of high risk pregnancy, unspecified, unspecified trimester: Secondary | ICD-10-CM | POA: Insufficient documentation
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
## 2019-09-02 NOTE — Progress Notes (Signed)
Talk about gardasil vaccine

## 2019-09-02 NOTE — Patient Instructions (Signed)

## 2019-09-03 ENCOUNTER — Encounter: Payer: Self-pay | Admitting: *Deleted

## 2019-09-03 LAB — GC/CHLAMYDIA PROBE AMP (~~LOC~~) NOT AT ARMC
Chlamydia: NEGATIVE
Comment: NEGATIVE
Comment: NORMAL
Neisseria Gonorrhea: NEGATIVE

## 2019-09-03 LAB — CBC/D/PLT+RPR+RH+ABO+RUB AB...
Antibody Screen: NEGATIVE
Basophils Absolute: 0 10*3/uL (ref 0.0–0.2)
Basos: 0 %
EOS (ABSOLUTE): 0.1 10*3/uL (ref 0.0–0.4)
Eos: 1 %
HCV Ab: <0.1 s/co ratio (ref 0.0–0.9)
HIV Screen 4th Generation wRfx: NONREACTIVE
Hematocrit: 39.2 % (ref 34.0–46.6)
Hemoglobin: 12.9 g/dL (ref 11.1–15.9)
Hepatitis B Surface Ag: NEGATIVE
Immature Grans (Abs): 0 10*3/uL (ref 0.0–0.1)
Immature Granulocytes: 0 %
Lymphocytes Absolute: 2.4 10*3/uL (ref 0.7–3.1)
Lymphs: 26 %
MCH: 29.1 pg (ref 26.6–33.0)
MCHC: 32.9 g/dL (ref 31.5–35.7)
MCV: 89 fL (ref 79–97)
Monocytes Absolute: 0.5 10*3/uL (ref 0.1–0.9)
Monocytes: 5 %
Neutrophils Absolute: 6.3 10*3/uL (ref 1.4–7.0)
Neutrophils: 68 %
Platelets: 182 10*3/uL (ref 150–450)
RBC: 4.43 x10E6/uL (ref 3.77–5.28)
RDW: 14.2 % (ref 11.7–15.4)
RPR Ser Ql: NONREACTIVE
Rh Factor: POSITIVE
Rubella Antibodies, IGG: 4.72 index (ref >0.99)
WBC: 9.3 10*3/uL (ref 3.4–10.8)

## 2019-09-03 LAB — HCV INTERPRETATION

## 2019-09-03 LAB — HEMOGLOBIN A1C
Est. average glucose Bld gHb Est-mCnc: 103 mg/dL
Hgb A1c MFr Bld: 5.2 % (ref 4.8–5.6)

## 2019-09-04 LAB — AFP, SERUM, OPEN SPINA BIFIDA
AFP MoM: 0.22
AFP Value: 6.7 ng/mL
Gest. Age on Collection Date: 15.1 weeks
Maternal Age At EDD: 31.2 yr
OSBR Risk 1 IN: 10000
Test Results:: NEGATIVE
Weight: 151 [lb_av]

## 2019-09-04 LAB — CERVICOVAGINAL ANCILLARY ONLY
Comment: NEGATIVE
Trichomonas: NEGATIVE

## 2019-09-04 LAB — CULTURE, OB URINE

## 2019-09-04 LAB — URINE CULTURE, OB REFLEX

## 2019-09-12 ENCOUNTER — Encounter: Payer: Self-pay | Admitting: *Deleted

## 2019-09-28 ENCOUNTER — Inpatient Hospital Stay (HOSPITAL_COMMUNITY)
Admission: AD | Admit: 2019-09-28 | Discharge: 2019-09-28 | Disposition: A | Discharge status: Home / Self Care | Payer: Medicaid Other | Attending: Family Medicine | Admitting: Family Medicine

## 2019-09-28 ENCOUNTER — Encounter (HOSPITAL_COMMUNITY): Payer: Self-pay | Admitting: Family Medicine

## 2019-09-28 ENCOUNTER — Other Ambulatory Visit: Payer: Self-pay

## 2019-09-28 DIAGNOSIS — F319 Bipolar disorder, unspecified: Secondary | ICD-10-CM | POA: Diagnosis not present

## 2019-09-28 DIAGNOSIS — Z8719 Personal history of other diseases of the digestive system: Secondary | ICD-10-CM | POA: Insufficient documentation

## 2019-09-28 DIAGNOSIS — Z630 Problems in relationship with spouse or partner: Secondary | ICD-10-CM | POA: Insufficient documentation

## 2019-09-28 DIAGNOSIS — T7491XA Unspecified adult maltreatment, confirmed, initial encounter: Secondary | ICD-10-CM

## 2019-09-28 DIAGNOSIS — Z3A18 18 weeks gestation of pregnancy: Secondary | ICD-10-CM | POA: Insufficient documentation

## 2019-09-28 DIAGNOSIS — O9A312 Physical abuse complicating pregnancy, second trimester: Secondary | ICD-10-CM | POA: Diagnosis not present

## 2019-09-28 DIAGNOSIS — O99512 Diseases of the respiratory system complicating pregnancy, second trimester: Secondary | ICD-10-CM | POA: Insufficient documentation

## 2019-09-28 DIAGNOSIS — O99342 Other mental disorders complicating pregnancy, second trimester: Secondary | ICD-10-CM | POA: Insufficient documentation

## 2019-09-28 DIAGNOSIS — J45909 Unspecified asthma, uncomplicated: Secondary | ICD-10-CM | POA: Insufficient documentation

## 2019-09-28 DIAGNOSIS — O0990 Supervision of high risk pregnancy, unspecified, unspecified trimester: Secondary | ICD-10-CM

## 2019-09-28 DIAGNOSIS — Z87440 Personal history of urinary (tract) infections: Secondary | ICD-10-CM | POA: Diagnosis not present

## 2019-09-28 DIAGNOSIS — O09892 Supervision of other high risk pregnancies, second trimester: Secondary | ICD-10-CM | POA: Diagnosis not present

## 2019-09-28 DIAGNOSIS — Z87891 Personal history of nicotine dependence: Secondary | ICD-10-CM | POA: Diagnosis not present
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
## 2019-09-28 NOTE — Discharge Instructions (Signed)
Domestic Violence and Pregnancy Domestic violence is any type of physical, sexual, or emotional harm done by a current or former partner. Emotional abuse includes threatening and controlling behavior. Stalking is an example of threatening behavior. This type of abuse can happen to anyone, at any time in a relationship. Domestic violence is also called intimate partner violence. Intimate partner violence is especially dangerous during pregnancy because the abuse may affect both you and your developing baby. Call a domestic violence hotline or let your health care provider know if you are being physically or sexually abused, or if your partner's threatening or controlling behavior is making you feel unsafe. Getting help and support protects you, your pregnancy, and your baby. How does this affect me? For you, the negative effects of intimate partner violence can include:  Physical injury or death (homicide).  Emotional stress and fear.  Trouble sleeping.  Loss of appetite.  Digestion problems.  Frequent infections, including sexually transmitted infections.  High blood pressure.  Depression.  Anxiety.  Thoughts of hurting yourself or committing suicide. Intimate partner violence may negatively affect your pregnancy in these ways:  You are more likely to be injured or have poor health.  You may be less likely to get prenatal care.  You may not have good nutrition or gain a healthy amount of weight.  You may be more likely to smoke, use drugs, and drink alcohol during pregnancy to relieve stress.  You may be at higher risk of losing your pregnancy (miscarriage or stillbirth).  Your baby may be born before 37 weeks of pregnancy (premature). How does this affect my baby? If you experience intimate partner violence during pregnancy:  Your baby may be injured.  Your baby may not survive pregnancy (miscarriage or stillbirth).  Your baby may be born premature, which can cause  mental and physical problems.  Your baby may not grow well in your womb and may be born small (small for gestational age).  If you use alcohol, your baby may be born with fetal alcohol syndrome, which can cause birth defects and other problems.  You may have trouble bonding with your baby, which can lead to neglect.  You may be less likely to breastfeed, which is the healthiest way to feed your baby. Follow these instructions at home:   Let your health care provider know that you are experiencing intimate partner violence.  Do not bring an abusive partner with you to your prenatal visits. This will allow you to speak freely with your provider.  Learn about and use resources on intimate partner violence, such as the [employer] Violence Hotline: ManchesterLofts.co.nz.  Consider counseling.  Consider getting a legal document that says your partner has to stay away from you (restraining order).  Have a support person stay with you in your home.  Have an escape plan to get to a safe place.  Do not smoke,use drugs, or drink alcohol to relieve stress.  Keep all your prenatal visits as told by your health care provider. This is important. Where to find more information  Futures Without Violence: futureswithoutviolence.org  [employer] Against Domestic Violence: CreditSham.com.pt  National Network to UnitedHealth Violence: InsuranceSquad.es  [employer] on Domestic Violence: ARanked.fi  The [employer]. Department of Justice, Office on Violence Against Women: BeginnerSteps.com.ee Contact a health care provider if:  You experience any type of intimate partner violence at home.  You need help to quit smoking, drinking, or taking drugs. Get help right away if:  You  do not feel safe at home.  You have thoughts of hurting yourself or committing suicide. If you ever feel like you may hurt yourself or others, or have thoughts about taking your own life, get help right away. You can go to your  nearest emergency department or call:  Your local emergency services (911 in the [place]  A suicide crisis helpline, such as the National Suicide Prevention Lifeline at 6711741382. This is open 24 hours a day. Summary  Domestic violence is also called intimate partner violence. Intimate partner violence can be physical, sexual, or emotional.  This type of abuse can happen to anyone at any time in a relationship, but the effects are especially dangerous during pregnancy.  Intimate partner violence increases your baby's risk of miscarriage, stillbirth, and premature birth.  Tell your health care provider if you experience any type of intimate partner violence. Get help right away if you do not feel safe at home. This information is not intended to replace advice given to you by your health care provider. Make sure you discuss any questions you have with your health care provider. Document Revised: 07/03/2018 Document Reviewed: 11/29/2016 Elsevier Patient Education  2020 [employer].   Intimate Partner Violence Information Intimate partner violence, also called domestic abuse or relationship abuse, is a pattern of behaviors used by one partner to gain or maintain power and control over the other partner. Intimate partner violence can happen to women and men and can happen between people who are or were:  Married.  Dating.  Living together. What are the types of intimate partner violence? Intimate partner violence can involve physical, emotional, psychological, sexual, and economic abuse, or stalking by a current or former partner. Different types of abuse can occur at the same time within the same relationship.  Physical abuse. This includes rough handling, threats with a weapon, throwing objects, pushing, or hitting.  Emotional and psychological abuse. This includes verbal attacks, rejection, humiliation, intimidation, social isolation, or threats. Abuse may also include limiting  contact with family and friends.  Sexual assault. Sexual assault is any unwanted sexual activity that occurs without clear permission (consent) from both people. This includes unwanted touching and sexual harassment.  Economic abuse. This includes controlling money, food, transportation, or other belongings.  Stalking. This involves such things as repeated, unwanted phone calls, e-mails, or text messages, or watching the victim from a distance. What are some warning signs of intimate partner violence? Physical signs  Bruises.  Broken bones.  Burns or cuts.  Physical pain.  Head injury. Emotional and psychological signs  Crying.  Depression.  Hopelessness.  Desperation.  Trouble sleeping.  Fear of the partner.  Anxiety.  Suicidal thoughts or behavior.  Antisocial behavior.  Low self-esteem.  Fear of intimacy.  Flashbacks. Sexual signs  Bruising, swelling, or bleeding of the genital or rectal area.  Signs of an STI, such as genital sores, warts, or discharge coming from the genital area.  Pain in the genital area.  Unintended pregnancy.  Problems with pregnancy. What are common behaviors of those affected by intimate partner violence? Those affected by intimate partner violence may:  Be late to work or other events.  Not show up to places as promised.  Have to let their partner know where they are and who they are with.  Be isolated or kept from seeing friends or family.  Make comments about their partner's temper or behavior.  Make excuses for their partner.  Engage in high-risk sexual behaviors.  Use drugs or alcohol.  Have unhealthy eating behaviors. What are common feelings of those affected by intimate partner violence? Victims of intimate partner violence may feel that they:  Must be careful not to say or do things that trigger their partner's anger.  Cannot do anything right.  Deserve to be treated badly.  Overreact to their  partner's behavior or temper.  Cannot trust their own feelings.  Cannot trust other people.  Are trapped.  May have their children taken away by their partner.  Are emotionally drained or numb.  Are in danger.  Might have to kill their partner to survive. Where can you get help? If you do not feel safe searching for help online at home, use a computer at a [employer] to access the Internet. Call 911 if you are in immediate danger or need medical help. Intimate partner violence hotlines and websites  The [employer] Violence Hotline. ? 24-hour phone hotline: 307 203 8227 (SAFE) or 779-230-5851 (TTY). ? Videophone: available Monday through Friday, 9 a.m. to 5 p.m. Call 325-829-1029. ? thehotline.org  The National Sexual Assault Hotline. ? 24-hour phone hotline: 3396703141. ? safehelpline.org Shelters for victims of intimate partner violence If you are a victim of intimate partner violence, there are resources to help you find a temporary place for you and your children to live (shelter). The specific address of these shelters is often not known to the public. Police Report assaults, threats, and stalking to the police. Counselors and counseling centers  Counseling can help you cope with difficult emotions and empower you to plan for your future safety. The topics you discuss with a counselor are private and confidential. Children of intimate partner violence victims also might need counseling to manage stress and anxiety. The court system You can work with a Clinical research associate or an advocate to get legal protection against an abuser. Protection includes restraining orders and private addresses. Crimes against you, such as assault, can also be prosecuted through the courts. Laws vary by state. Follow these instructions at home:  Create a safety plan that includes ways to remain safe while you are in an abusive relationship, while you are planning to leave, or after you  leave. This plan may be created by the victim alone or with assistance from the domestic violence hotline staff or local shelter staff. Your safety plan may include: ? How to cope with emotions. ? How to tell friends and family about the abuse. ? How to take legal action. ? How to create a safe home environment. ? How to keep your children safe. ? Emergency plans for life-threatening situations. Get help right away if you:  Feel like you are in immediate danger.  Feel like you may hurt yourself or others. If you ever feel like you may hurt yourself or others, or have thoughts about taking your own life, get help right away. You can go to your nearest emergency department or call:  Your local emergency services (911 in the [place]  A suicide crisis helpline, such as the National Suicide Prevention Lifeline at 7875688557. This is open 24 hours a day. Summary  If you are a victim of intimate partner violence, there are resources to help you find a temporary place for you and your children to live (shelter).  Create a safety plan that includes ways to remain safe while you are in an abusive relationship, while you are planning to leave, or after you leave. This information is not intended to replace advice given to  you by your health care provider. Make sure you discuss any questions you have with your health care provider. Document Revised: 08/20/2018 Document Reviewed: 04/27/2017 Elsevier Patient Education  2020 [employer].
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
## 2019-09-30 ENCOUNTER — Other Ambulatory Visit: Payer: Self-pay

## 2019-09-30 ENCOUNTER — Encounter: Payer: Self-pay | Admitting: Obstetrics and Gynecology

## 2019-09-30 ENCOUNTER — Telehealth (INDEPENDENT_AMBULATORY_CARE_PROVIDER_SITE_OTHER): Payer: Medicaid Other | Admitting: Obstetrics and Gynecology

## 2019-09-30 ENCOUNTER — Ambulatory Visit: Payer: Medicaid Other | Attending: Obstetrics and Gynecology

## 2019-09-30 DIAGNOSIS — O9A312 Physical abuse complicating pregnancy, second trimester: Secondary | ICD-10-CM

## 2019-09-30 DIAGNOSIS — N96 Recurrent pregnancy loss: Secondary | ICD-10-CM | POA: Diagnosis present

## 2019-09-30 DIAGNOSIS — O0990 Supervision of high risk pregnancy, unspecified, unspecified trimester: Secondary | ICD-10-CM | POA: Diagnosis not present

## 2019-09-30 DIAGNOSIS — Z3A19 19 weeks gestation of pregnancy: Secondary | ICD-10-CM

## 2019-09-30 DIAGNOSIS — F99 Mental disorder, not otherwise specified: Secondary | ICD-10-CM

## 2019-09-30 DIAGNOSIS — Z3A15 15 weeks gestation of pregnancy: Secondary | ICD-10-CM

## 2019-09-30 DIAGNOSIS — Z363 Encounter for antenatal screening for malformations: Secondary | ICD-10-CM

## 2019-09-30 DIAGNOSIS — T7491XD Unspecified adult maltreatment, confirmed, subsequent encounter: Secondary | ICD-10-CM

## 2019-09-30 DIAGNOSIS — O0992 Supervision of high risk pregnancy, unspecified, second trimester: Secondary | ICD-10-CM

## 2019-09-30 DIAGNOSIS — O2622 Pregnancy care for patient with recurrent pregnancy loss, second trimester: Secondary | ICD-10-CM

## 2019-09-30 DIAGNOSIS — R87610 Atypical squamous cells of undetermined significance on cytologic smear of cervix (ASC-US): Secondary | ICD-10-CM

## 2019-09-30 DIAGNOSIS — Z3687 Encounter for antenatal screening for uncertain dates: Secondary | ICD-10-CM

## 2019-09-30 DIAGNOSIS — O99342 Other mental disorders complicating pregnancy, second trimester: Secondary | ICD-10-CM

## 2019-09-30 DIAGNOSIS — O3442 Maternal care for other abnormalities of cervix, second trimester: Secondary | ICD-10-CM

## 2019-09-30 NOTE — Progress Notes (Signed)
I connected with  Connie Cabrera on 09/30/19 at  9:15 AM EDT by mychart video and verified that I am speaking with the correct person using two identifiers.   I discussed the limitations, risks, security and privacy concerns of performing an evaluation and management service by telephone and the availability of in person appointments. I also discussed with the patient that there may be a patient responsible charge related to this service. The patient expressed understanding and agreed to proceed.  Marylynn Pearson, RN 09/30/2019  9:14 AM
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
## 2019-10-01 ENCOUNTER — Other Ambulatory Visit: Payer: Self-pay | Admitting: *Deleted

## 2019-10-01 DIAGNOSIS — O2620 Pregnancy care for patient with recurrent pregnancy loss, unspecified trimester: Secondary | ICD-10-CM

## 2019-10-02 ENCOUNTER — Encounter: Payer: Self-pay | Admitting: Student

## 2019-10-02 DIAGNOSIS — L237 Allergic contact dermatitis due to plants, except food: Secondary | ICD-10-CM

## 2019-10-02 MED ORDER — METHYLPREDNISOLONE 4 MG PO TBPK: ORAL_TABLET | Freq: Every day | ORAL | 0 refills | Status: DC

## 2019-10-09 NOTE — BH Specialist Note (Signed)
Pt did not arrive to video visit and did not answer the phone ; Left HIPPA-compliant message to call back Asher Muir from Center for [employer] at [facility] at [phone] (main office) or [phone] (Kameren Baade's office).  ; left MyChart message for patient.    Integrated Behavioral Health via Telemedicine Video (Caregility) Visit  10/09/2019 Connie Cabrera [id number]  Connie Cabrera  Depression screen [facility] 2/9 09/30/2019 09/02/2019 09/01/2019 06/12/2018 05/15/2018  Decreased Interest 3 2 3 2 1   Down, Depressed, Hopeless 1 2 1 2 1   PHQ - 2 Score 4 4 4 4 2   Altered sleeping 2 1 1 3 2   Tired, decreased energy 1 1 3 2 1   Change in appetite 0 0 1 0 0  Feeling bad or failure about yourself  1 1 1  0 0  Trouble concentrating 1 2 0 0 0  Moving slowly or fidgety/restless 0 2 0 1 1  Suicidal thoughts 0 0 0 0 0  PHQ-9 Score 9 11 10 10 6   Difficult doing work/chores - Not difficult at all - - -   GAD 7 : Generalized Anxiety Score 09/30/2019 09/02/2019 09/01/2019 06/12/2018  Nervous, Anxious, on Edge 1 2 3 1   Control/stop worrying 1 1 0 2  Worry too much - different things 3 2 2 2   Trouble relaxing 3 1 1 1   Restless 0 0 0 2  Easily annoyed or irritable 3 3 3 2   Afraid - awful might happen 1 0 1 2  Total GAD 7 Score 12 9 10  12

## 2019-10-13 ENCOUNTER — Telehealth: Payer: Self-pay | Admitting: Family Medicine

## 2019-10-13 ENCOUNTER — Ambulatory Visit: Payer: Self-pay | Admitting: Clinical

## 2019-10-13 DIAGNOSIS — Z91199 Patient's noncompliance with other medical treatment and regimen due to unspecified reason: Secondary | ICD-10-CM

## 2019-10-13 NOTE — Telephone Encounter (Signed)
Patient called into the office and stated that she had an appointment with the behavioral health person and she did not answer the phone because she wanted this appointment 2 weeks ago and now she does not want to talk to anyone about her business.

## 2019-10-28 ENCOUNTER — Ambulatory Visit: Payer: Medicaid Other | Attending: Maternal & Fetal Medicine

## 2019-10-28 ENCOUNTER — Other Ambulatory Visit: Payer: Self-pay

## 2019-10-28 ENCOUNTER — Telehealth (INDEPENDENT_AMBULATORY_CARE_PROVIDER_SITE_OTHER): Payer: Self-pay | Admitting: Student

## 2019-10-28 ENCOUNTER — Ambulatory Visit: Payer: Medicaid Other | Admitting: *Deleted

## 2019-10-28 DIAGNOSIS — O2622 Pregnancy care for patient with recurrent pregnancy loss, second trimester: Secondary | ICD-10-CM | POA: Diagnosis not present

## 2019-10-28 DIAGNOSIS — Z363 Encounter for antenatal screening for malformations: Secondary | ICD-10-CM | POA: Diagnosis not present

## 2019-10-28 DIAGNOSIS — O0990 Supervision of high risk pregnancy, unspecified, unspecified trimester: Secondary | ICD-10-CM

## 2019-10-28 DIAGNOSIS — O2620 Pregnancy care for patient with recurrent pregnancy loss, unspecified trimester: Secondary | ICD-10-CM | POA: Insufficient documentation

## 2019-10-28 DIAGNOSIS — Z91199 Patient's noncompliance with other medical treatment and regimen due to unspecified reason: Secondary | ICD-10-CM

## 2019-10-28 DIAGNOSIS — Z3A19 19 weeks gestation of pregnancy: Secondary | ICD-10-CM

## 2019-10-28 DIAGNOSIS — Z5329 Procedure and treatment not carried out because of patient's decision for other reasons: Secondary | ICD-10-CM

## 2019-10-28 NOTE — Progress Notes (Signed)
Pt requested to be rescheduled, states was not aware of this virtual visit.

## 2019-10-30 NOTE — Progress Notes (Signed)
Patient ID: Connie Cabrera, female   DOB: 1990-09-22, 29 y.o.   MRN: [id number] Patient will need to be rescheduled.
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
## 2019-11-18 ENCOUNTER — Encounter: Payer: Self-pay | Admitting: Family Medicine

## 2019-11-18 ENCOUNTER — Encounter: Payer: Self-pay | Admitting: Student

## 2019-11-18 ENCOUNTER — Encounter: Payer: Medicaid Other | Admitting: Women's Health

## 2019-11-19 ENCOUNTER — Ambulatory Visit (HOSPITAL_COMMUNITY)
Admission: RE | Admit: 2019-11-19 | Discharge: 2019-11-19 | Disposition: A | Discharge status: Home / Self Care | Payer: Medicaid Other | Source: Ambulatory Visit | Attending: Family Medicine | Admitting: Family Medicine

## 2019-11-19 ENCOUNTER — Encounter (HOSPITAL_COMMUNITY): Payer: Medicaid Other

## 2019-11-19 ENCOUNTER — Other Ambulatory Visit: Payer: Self-pay

## 2019-11-19 ENCOUNTER — Telehealth: Payer: Self-pay | Admitting: Lactation Services

## 2019-11-19 ENCOUNTER — Telehealth (INDEPENDENT_AMBULATORY_CARE_PROVIDER_SITE_OTHER): Payer: Medicaid Other | Admitting: Lactation Services

## 2019-11-19 ENCOUNTER — Other Ambulatory Visit: Payer: Self-pay | Admitting: Lactation Services

## 2019-11-19 DIAGNOSIS — R600 Localized edema: Secondary | ICD-10-CM | POA: Diagnosis present

## 2019-11-19 NOTE — Telephone Encounter (Signed)
Received call from [facility] to say that no DVT noted and did a brief arterial scan with no abnormalities seen with a patent iliac artery. Will load report to chart soon.   Patient last seen in office on 8/3. She no showed for appt on 8/24. Message to front office to schedule follow up appt at first available.

## 2019-11-19 NOTE — Progress Notes (Signed)
Patient reports pain, cramping and swelling to left leg. She reports leg is cooler to touch than other leg and that it blanches when she flexes her foot. Spoke with Dr. Shawnie Pons, who wants Lower Extremity Doppler ordered and patient reports she can go now and can be there in about 20 minutes. Christy at [facility] was notified patient is coming over now. Patient informed we will reach out to her once we receive the results and will call her.

## 2019-11-19 NOTE — Telephone Encounter (Signed)
Patient called and said she cannot make the morning appt and would like to take the 3:45 appointment today.   Called Christy at [facility] to let her know that patient will come at 3:45.

## 2019-11-25 ENCOUNTER — Telehealth: Payer: Self-pay | Admitting: Family Medicine

## 2019-11-25 NOTE — Telephone Encounter (Signed)
Called patient to get her scheduled for an OB appointment. Asked if she was available to come in today. She was very rude, and used vulgar and offensive language. She stated no one ever calls her to make appointments, they just make them. She stated just because she has MyChart doesn't mean she is always checking her emails. She stated we act as though everybody has time to do that.  I apologized and asked her if she would like the appointment. She responded with crudeness no! Then she proceeded to tell me more about how slack we were. Again apologizing she has had such a bad experience here. Then offered her another appointment. She stated she works, and we can't be just making appointments for people without calling them first. I stated yes ma'am, and would you like to have this appointment? She said just make it, and began to speak to me using more vulgar language.

## 2019-12-08 ENCOUNTER — Ambulatory Visit (INDEPENDENT_AMBULATORY_CARE_PROVIDER_SITE_OTHER): Payer: Medicaid Other | Admitting: Student

## 2019-12-08 ENCOUNTER — Other Ambulatory Visit: Payer: Self-pay

## 2019-12-08 VITALS — BP 101/68 | HR 89 | Wt 173.0 lb

## 2019-12-08 DIAGNOSIS — O0990 Supervision of high risk pregnancy, unspecified, unspecified trimester: Secondary | ICD-10-CM

## 2019-12-08 DIAGNOSIS — Z3A24 24 weeks gestation of pregnancy: Secondary | ICD-10-CM
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
## 2019-12-08 NOTE — Patient Instructions (Signed)
[place] [employer]  Department of Social [place] [address], [place], [place] [address] [phone]   or  [place]human-services/social-services **SNAP/EBT/ Other nutritional benefits  [place] DHHS-Public Health-WIC [address], [place], [place] [address] [phone]) [phone]  or  https://[place].gov[place]/human-services/health-department **WIC for  women who are pregnant and postpartum, infants and children up to 5 years old  [employer] [facility] [address], [place], [place] [address] [phone]   or   www.theblessedtable.org  **Food pantry  [employer] [address], [place], [place] [address] [phone]   or   https://[employer]kolbes.godaddysites.com  **Emergency food and prepared meals  [facility] [employer] [facility] [address], [place], [place] [address] [phone]   or   www.cedargrovetop.us **Food pantry  [facility] [employer] [facility] [address], [place], [place] [address] [phone]   or   www.facebook.com/[facility]-116430931718202 **Food pantry  [facility] [facility] [address], [place], [place] [address] [phone] **Food pantry  [facility] [address], [place], [place] [address] [phone]   or   www.greensborourbanministry.org  **Food pantry and prepared meals  [facility] [address], [place], [place] [address] https://[email or web address]/  **Food pantry  [employer] [facility] [address], [place], [place] [address] [phone]   or   www.lbcnow.org  **Food pantry  [employer] [address], [place], [place] [address] [phone]   or   http://www.onestepfurther.com **Food pantry, nutrition education, gardening activities  [employer] [facility] [address], [place], [place] [address] [phone] **Food pantry  [facility] [place] [address], [place], [place] [address] [phone]   or   www.salvationarmyof[place].org **Food pantry  [employer] [facility] [place] [address], [place], [place] [address] [phone]   or   http://senior-resources[place].org **Meals on Wheels Program  [employer] [address], , [place] [address] [phone]   or   www.stmattchurch.com  **Food pantry  [employer] [facility] [address], , [place] [address] [phone]   or   [facility].org **Food pantry     

## 2020-01-02 ENCOUNTER — Other Ambulatory Visit: Payer: Self-pay | Admitting: General Practice

## 2020-01-02 DIAGNOSIS — O0990 Supervision of high risk pregnancy, unspecified, unspecified trimester: Secondary | ICD-10-CM

## 2020-01-05 ENCOUNTER — Other Ambulatory Visit: Payer: Medicaid Other

## 2020-01-05 ENCOUNTER — Other Ambulatory Visit: Payer: Self-pay

## 2020-01-05 ENCOUNTER — Ambulatory Visit (INDEPENDENT_AMBULATORY_CARE_PROVIDER_SITE_OTHER): Payer: Medicaid Other | Admitting: Student

## 2020-01-05 VITALS — BP 106/74 | HR 85 | Wt 182.1 lb

## 2020-01-05 DIAGNOSIS — Z23 Encounter for immunization: Secondary | ICD-10-CM

## 2020-01-05 DIAGNOSIS — O0990 Supervision of high risk pregnancy, unspecified, unspecified trimester: Secondary | ICD-10-CM | POA: Diagnosis not present

## 2020-01-05 DIAGNOSIS — Z3A28 28 weeks gestation of pregnancy: Secondary | ICD-10-CM

## 2020-01-05 MED ORDER — HYDROCORTISONE (PERIANAL) 2.5 % EX CREA: 1.0000 "application " | TOPICAL_CREAM | Freq: Two times a day (BID) | CUTANEOUS | 0 refills | Status: DC

## 2020-01-05 NOTE — Addendum Note (Signed)
Addended by: Marjo Bicker on: 01/05/2020 09:54 AM   Modules accepted: Orders
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
## 2020-01-06 LAB — CBC
Hematocrit: 33.7 % — ABNORMAL LOW (ref 34.0–46.6)
Hemoglobin: 11.3 g/dL (ref 11.1–15.9)
MCH: 29.8 pg (ref 26.6–33.0)
MCHC: 33.5 g/dL (ref 31.5–35.7)
MCV: 89 fL (ref 79–97)
Platelets: 173 10*3/uL (ref 150–450)
RBC: 3.79 x10E6/uL (ref 3.77–5.28)
RDW: 13.1 % (ref 11.7–15.4)
WBC: 9.9 10*3/uL (ref 3.4–10.8)

## 2020-01-06 LAB — GLUCOSE TOLERANCE, 2 HOURS W/ 1HR
Glucose, 1 hour: 100 mg/dL (ref 65–179)
Glucose, 2 hour: 89 mg/dL (ref 65–152)
Glucose, Fasting: 87 mg/dL (ref 65–91)

## 2020-01-06 LAB — RPR: RPR Ser Ql: NONREACTIVE

## 2020-01-06 LAB — HIV ANTIBODY (ROUTINE TESTING W REFLEX): HIV Screen 4th Generation wRfx: NONREACTIVE

## 2020-01-20 ENCOUNTER — Ambulatory Visit (INDEPENDENT_AMBULATORY_CARE_PROVIDER_SITE_OTHER): Payer: Medicaid Other | Admitting: Student

## 2020-01-20 VITALS — BP 121/67 | HR 79 | Wt 186.4 lb

## 2020-01-20 DIAGNOSIS — Z3A31 31 weeks gestation of pregnancy: Secondary | ICD-10-CM

## 2020-01-20 DIAGNOSIS — O0990 Supervision of high risk pregnancy, unspecified, unspecified trimester: Secondary | ICD-10-CM
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
## 2020-02-04 ENCOUNTER — Other Ambulatory Visit (HOSPITAL_COMMUNITY)
Admission: RE | Admit: 2020-02-04 | Discharge: 2020-02-04 | Disposition: A | Discharge status: Home / Self Care | Payer: Medicaid Other | Source: Ambulatory Visit | Attending: Student | Admitting: Student

## 2020-02-04 ENCOUNTER — Ambulatory Visit (INDEPENDENT_AMBULATORY_CARE_PROVIDER_SITE_OTHER): Payer: Medicaid Other | Admitting: Student

## 2020-02-04 ENCOUNTER — Other Ambulatory Visit: Payer: Self-pay

## 2020-02-04 VITALS — BP 120/77 | HR 88 | Wt 184.3 lb

## 2020-02-04 DIAGNOSIS — Z3A33 33 weeks gestation of pregnancy: Secondary | ICD-10-CM

## 2020-02-04 DIAGNOSIS — N898 Other specified noninflammatory disorders of vagina: Secondary | ICD-10-CM

## 2020-02-04 NOTE — Patient Instructions (Signed)
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
## 2020-02-05 LAB — CERVICOVAGINAL ANCILLARY ONLY
Bacterial Vaginitis (gardnerella): NEGATIVE
Candida Glabrata: NEGATIVE
Candida Vaginitis: NEGATIVE
Chlamydia: NEGATIVE
Comment: NEGATIVE
Comment: NEGATIVE
Comment: NEGATIVE
Comment: NEGATIVE
Comment: NEGATIVE
Comment: NORMAL
Neisseria Gonorrhea: NEGATIVE
Trichomonas: NEGATIVE

## 2020-02-25 ENCOUNTER — Other Ambulatory Visit: Payer: Self-pay

## 2020-02-25 ENCOUNTER — Other Ambulatory Visit (HOSPITAL_COMMUNITY)
Admission: RE | Admit: 2020-02-25 | Discharge: 2020-02-25 | Disposition: A | Discharge status: Home / Self Care | Payer: Medicaid Other | Source: Ambulatory Visit | Attending: Student | Admitting: Student

## 2020-02-25 ENCOUNTER — Encounter: Payer: Self-pay | Admitting: Student

## 2020-02-25 ENCOUNTER — Ambulatory Visit (INDEPENDENT_AMBULATORY_CARE_PROVIDER_SITE_OTHER): Payer: Medicaid Other | Admitting: Student

## 2020-02-25 VITALS — BP 114/78 | HR 102 | Wt 190.3 lb

## 2020-02-25 DIAGNOSIS — Z3A36 36 weeks gestation of pregnancy: Secondary | ICD-10-CM

## 2020-02-25 DIAGNOSIS — O0990 Supervision of high risk pregnancy, unspecified, unspecified trimester: Secondary | ICD-10-CM | POA: Insufficient documentation
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
## 2020-02-25 NOTE — Patient Instructions (Signed)

## 2020-02-25 NOTE — Addendum Note (Signed)
Addended by: Marjo Bicker on: 02/25/2020 04:26 PM   Modules accepted: Orders
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
## 2020-02-26 ENCOUNTER — Other Ambulatory Visit: Payer: Self-pay | Admitting: Student

## 2020-02-26 LAB — GC/CHLAMYDIA PROBE AMP (~~LOC~~) NOT AT ARMC
Chlamydia: NEGATIVE
Comment: NEGATIVE
Comment: NORMAL
Neisseria Gonorrhea: NEGATIVE

## 2020-02-29 LAB — CULTURE, BETA STREP (GROUP B ONLY): Strep Gp B Culture: NEGATIVE

## 2020-03-02 ENCOUNTER — Encounter (HOSPITAL_COMMUNITY): Payer: Self-pay | Admitting: Obstetrics and Gynecology

## 2020-03-02 ENCOUNTER — Other Ambulatory Visit: Payer: Self-pay

## 2020-03-02 ENCOUNTER — Inpatient Hospital Stay (HOSPITAL_COMMUNITY)
Admission: EM | Admit: 2020-03-02 | Discharge: 2020-03-02 | Disposition: A | Discharge status: Home / Self Care | Payer: Medicaid Other | Attending: Obstetrics and Gynecology | Admitting: Obstetrics and Gynecology

## 2020-03-02 DIAGNOSIS — O9A213 Injury, poisoning and certain other consequences of external causes complicating pregnancy, third trimester: Secondary | ICD-10-CM | POA: Insufficient documentation

## 2020-03-02 DIAGNOSIS — O26893 Other specified pregnancy related conditions, third trimester: Secondary | ICD-10-CM | POA: Insufficient documentation

## 2020-03-02 DIAGNOSIS — J45909 Unspecified asthma, uncomplicated: Secondary | ICD-10-CM | POA: Diagnosis not present

## 2020-03-02 DIAGNOSIS — O99891 Other specified diseases and conditions complicating pregnancy: Secondary | ICD-10-CM

## 2020-03-02 DIAGNOSIS — R109 Unspecified abdominal pain: Secondary | ICD-10-CM | POA: Insufficient documentation

## 2020-03-02 DIAGNOSIS — O99513 Diseases of the respiratory system complicating pregnancy, third trimester: Secondary | ICD-10-CM | POA: Insufficient documentation

## 2020-03-02 DIAGNOSIS — Z3689 Encounter for other specified antenatal screening: Secondary | ICD-10-CM

## 2020-03-02 DIAGNOSIS — Z3A37 37 weeks gestation of pregnancy: Secondary | ICD-10-CM | POA: Diagnosis not present

## 2020-03-02 DIAGNOSIS — M549 Dorsalgia, unspecified: Secondary | ICD-10-CM

## 2020-03-02 DIAGNOSIS — R1030 Lower abdominal pain, unspecified: Secondary | ICD-10-CM

## 2020-03-02 DIAGNOSIS — Z87891 Personal history of nicotine dependence: Secondary | ICD-10-CM | POA: Diagnosis not present

## 2020-03-02 LAB — URINALYSIS, ROUTINE W REFLEX MICROSCOPIC
Bilirubin Urine: NEGATIVE
Glucose, UA: NEGATIVE mg/dL
Hgb urine dipstick: NEGATIVE
Ketones, ur: NEGATIVE mg/dL
Leukocytes,Ua: NEGATIVE
Nitrite: NEGATIVE
Protein, ur: NEGATIVE mg/dL
Specific Gravity, Urine: 1.019 (ref 1.005–1.030)
pH: 6 (ref 5.0–8.0)

## 2020-03-02 MED ORDER — ACETAMINOPHEN 500 MG PO TABS
1000.0000 mg | ORAL_TABLET | Freq: Once | ORAL | Status: AC
  Administered 2020-03-02: 1000 mg via ORAL
  Filled 2020-03-02: qty 2
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
## 2020-03-02 NOTE — ED Notes (Signed)
[facility] is preparing pt to bring her to MAU.
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
## 2020-03-09 ENCOUNTER — Telehealth: Payer: Self-pay | Admitting: Lactation Services

## 2020-03-09 NOTE — Telephone Encounter (Signed)
Returned patients call. Patient did not answer. LM for patient to call the office if she is still having concerns.

## 2020-03-15 ENCOUNTER — Ambulatory Visit (INDEPENDENT_AMBULATORY_CARE_PROVIDER_SITE_OTHER): Payer: Medicaid Other | Admitting: Obstetrics and Gynecology

## 2020-03-15 ENCOUNTER — Encounter: Payer: Self-pay | Admitting: Obstetrics and Gynecology

## 2020-03-15 ENCOUNTER — Other Ambulatory Visit: Payer: Self-pay

## 2020-03-15 VITALS — BP 124/76 | HR 75 | Wt 194.6 lb

## 2020-03-15 DIAGNOSIS — Z3490 Encounter for supervision of normal pregnancy, unspecified, unspecified trimester: Secondary | ICD-10-CM

## 2020-03-15 DIAGNOSIS — Z3A38 38 weeks gestation of pregnancy: Secondary | ICD-10-CM

## 2020-03-15 NOTE — Patient Instructions (Signed)

## 2020-03-15 NOTE — Progress Notes (Signed)
Contractions yesterday

## 2020-03-15 NOTE — Progress Notes (Signed)
   LOW-RISK PREGNANCY OFFICE VISIT Patient name: Connie Cabrera MRN [id number]  Date of birth: 02-Jun-1990 Chief Complaint:   Routine Prenatal Visit  History of Present Illness:   Connie Cabrera is a 29 y.o. [id number] female at [redacted]w[redacted]d with an Estimated Date of Delivery: 03/23/20 being seen today for ongoing management of a low-risk pregnancy.  Today she reports occasional contractions. She states she has had contractions all day yesterday, but none today. She is eager to have her baby tomorrow, so she is "doing everything to get labor  going." Contractions: Irritability. Vag. Bleeding: None.  Movement: Present. denies leaking of fluid. Review of Systems:   Pertinent items are noted in HPI Denies abnormal vaginal discharge w/ itching/odor/irritation, headaches, visual changes, shortness of breath, chest pain, abdominal pain, severe nausea/vomiting, or problems with urination or bowel movements unless otherwise stated above. Pertinent History Reviewed:  Reviewed past medical,surgical, social, obstetrical and family history.  Reviewed problem list, medications and allergies. Physical Assessment:   Vitals:   03/15/20 1635  BP: 124/76  Pulse: 75  Weight: 194 lb 9.6 oz (88.3 kg)  Body mass index is 30.48 kg/m.        Physical Examination:   General appearance: Well appearing, and in no distress  Mental status: Alert, oriented to person, place, and time  Skin: Warm & dry  Cardiovascular: Normal heart rate noted  Respiratory: Normal respiratory effort, no distress  Abdomen: Soft, gravid, nontender  Pelvic: Cervical exam performed  Dilation: 2.5 Effacement (%): 70 Station: -3  Extremities: Edema: None  Fetal Status: Fetal Heart Rate (bpm): 148 Fundal Height: 36 cm Movement: Present Presentation: Vertex  No results found for this or any previous visit (from the past 24 hour(s)).  Assessment & Plan:  1) Low-risk pregnancy [id number] at 103w6d with an Estimated Date of Delivery: 03/23/20    2) Encounter for supervision of low-risk pregnancy, antepartum - Offered membranes sweep -- declined - Advised to have SI to stimulate labor  3) [redacted] weeks gestation of pregnancy    Meds: No orders of the defined types were placed in this encounter.  Labs/procedures today: cervical exam  Plan:  Continue routine obstetrical care   Reviewed: Term labor symptoms and general obstetric precautions including but not limited to vaginal bleeding, contractions, leaking of fluid and fetal movement were reviewed in detail with the patient.  All questions were answered. Has home bp cuff. Check bp weekly, let us know if >140/90.   Follow-up: Return in about 1 week (around 03/22/2020) for Return OB visit.  No orders of the defined types were placed in this encounter.  Raelyn Mora MSN, CNM 03/15/2020 4:41 PM

## 2020-03-16 ENCOUNTER — Encounter (HOSPITAL_COMMUNITY): Payer: Self-pay | Admitting: Obstetrics & Gynecology

## 2020-03-16 ENCOUNTER — Inpatient Hospital Stay (EMERGENCY_DEPARTMENT_HOSPITAL)
Admission: AD | Admit: 2020-03-16 | Discharge: 2020-03-16 | Disposition: A | Discharge status: Home / Self Care | Payer: Medicaid Other | Source: Home / Self Care | Attending: Obstetrics & Gynecology | Admitting: Obstetrics & Gynecology

## 2020-03-16 DIAGNOSIS — O0990 Supervision of high risk pregnancy, unspecified, unspecified trimester: Secondary | ICD-10-CM

## 2020-03-16 DIAGNOSIS — Z3A39 39 weeks gestation of pregnancy: Secondary | ICD-10-CM | POA: Insufficient documentation

## 2020-03-16 DIAGNOSIS — O471 False labor at or after 37 completed weeks of gestation: Secondary | ICD-10-CM | POA: Insufficient documentation

## 2020-03-16 NOTE — MAU Note (Signed)
Pt reports ctx for several days. Got worse around 3 am.  About 9.5 min apart. Denies vag bleeding. Had some leaikng after taking a bath. More like mucusy discharge. Good fetal movement felt through out the night.  2.5cm on last exam.

## 2020-03-16 NOTE — MAU Provider Note (Signed)
S: Ms. Connie Cabrera is a 29 y.o. [phone] at [redacted]w[redacted]d  who presents to MAU today complaining contractions q 3-10 minutes since 0300 this morning. She denies vaginal bleeding. She endorses LOF. She reports normal fetal movement.    O: BP 121/67   Pulse 74   Temp 97.6 F (36.4 C)   Ht 5\' 7"  (1.702 m)   Wt 195 lb (88.5 kg)   LMP 05/19/2019   BMI 30.54 kg/m  GENERAL: Well-developed, well-nourished female in no acute distress.  HEAD: Normocephalic, atraumatic.  CHEST: Normal effort of breathing, regular heart rate ABDOMEN: Soft, nontender, gravid  Cervical exam (unchanged throughout MAU visit):  Dilation: 2.5 Effacement (%): 60 Cervical Position: Posterior Station: -3 Presentation: Vertex Exam by:: [email or web address], RN  Fetal Monitoring: reactive Baseline: 130 Variability: moderate Accelerations: present Decelerations: none Contractions: irregular q3-50min   A: SIUP at [redacted]w[redacted]d  False labor  P: Discharge to home with term labor precautions   [redacted]w[redacted]d, CNM 03/16/2020 9:09 AM

## 2020-03-16 NOTE — Discharge Instructions (Signed)
Braxton Hicks Contractions °Contractions of the uterus can occur throughout pregnancy, but they are not always a sign that you are in labor. You may have practice contractions called Braxton Hicks contractions. These false labor contractions are sometimes confused with true labor. °What are Braxton Hicks contractions? °Braxton Hicks contractions are tightening movements that occur in the muscles of the uterus before labor. Unlike true labor contractions, these contractions do not result in opening (dilation) and thinning of the cervix. Toward the end of pregnancy (32-34 weeks), Braxton Hicks contractions can happen more often and may become stronger. These contractions are sometimes difficult to tell apart from true labor because they can be very uncomfortable. You should not feel embarrassed if you go to the hospital with false labor. °Sometimes, the only way to tell if you are in true labor is for your health care provider to look for changes in the cervix. The health care provider will do a physical exam and may monitor your contractions. If you are not in true labor, the exam should show that your cervix is not dilating and your water has not broken. °If there are no other health problems associated with your pregnancy, it is completely safe for you to be sent home with false labor. You may continue to have Braxton Hicks contractions until you go into true labor. °How to tell the difference between true labor and false labor °True labor °· Contractions last 30-70 seconds. °· Contractions become very regular. °· Discomfort is usually felt in the top of the uterus, and it spreads to the lower abdomen and low back. °· Contractions do not go away with walking. °· Contractions usually become more intense and increase in frequency. °· The cervix dilates and gets thinner. °False labor °· Contractions are usually shorter and not as strong as true labor contractions. °· Contractions are usually irregular. °· Contractions  are often felt in the front of the lower abdomen and in the groin. °· Contractions may go away when you walk around or change positions while lying down. °· Contractions get weaker and are shorter-lasting as time goes on. °· The cervix usually does not dilate or become thin. °Follow these instructions at home: ° °· Take over-the-counter and prescription medicines only as told by your health care provider. °· Keep up with your usual exercises and follow other instructions from your health care provider. °· Eat and drink lightly if you think you are going into labor. °· If Braxton Hicks contractions are making you uncomfortable: °? Change your position from lying down or resting to walking, or change from walking to resting. °? Sit and rest in a tub of warm water. °? Drink enough fluid to keep your urine pale yellow. Dehydration may cause these contractions. °? Do slow and deep breathing several times an hour. °· Keep all follow-up prenatal visits as told by your health care provider. This is important. °Contact a health care provider if: °· You have a fever. °· You have continuous pain in your abdomen. °Get help right away if: °· Your contractions become stronger, more regular, and closer together. °· You have fluid leaking or gushing from your vagina. °· You pass blood-tinged mucus (bloody show). °· You have bleeding from your vagina. °· You have low back pain that you never had before. °· You feel your baby’s head pushing down and causing pelvic pressure. °· Your baby is not moving inside you as much as it used to. °Summary °· Contractions that occur before labor are   called Braxton Hicks contractions, false labor, or practice contractions. °· Braxton Hicks contractions are usually shorter, weaker, farther apart, and less regular than true labor contractions. True labor contractions usually become progressively stronger and regular, and they become more frequent. °· Manage discomfort from Braxton Hicks contractions  by changing position, resting in a warm bath, drinking plenty of water, or practicing deep breathing. °This information is not intended to replace advice given to you by your health care provider. Make sure you discuss any questions you have with your health care provider. °Document Revised: 02/23/2017 Document Reviewed: 07/27/2016 °Elsevier Patient Education © 2020 [employer] ° °

## 2020-03-17 ENCOUNTER — Encounter (HOSPITAL_COMMUNITY): Payer: Self-pay | Admitting: Obstetrics and Gynecology

## 2020-03-17 ENCOUNTER — Other Ambulatory Visit: Payer: Self-pay

## 2020-03-17 ENCOUNTER — Inpatient Hospital Stay (HOSPITAL_COMMUNITY)
Admission: AD | Admit: 2020-03-17 | Discharge: 2020-03-18 | DRG: 807 | Disposition: A | Discharge status: Home / Self Care | Payer: Medicaid Other | Attending: Family Medicine | Admitting: Family Medicine

## 2020-03-17 ENCOUNTER — Inpatient Hospital Stay (HOSPITAL_COMMUNITY): Payer: Medicaid Other | Admitting: Anesthesiology

## 2020-03-17 DIAGNOSIS — Z20822 Contact with and (suspected) exposure to covid-19: Secondary | ICD-10-CM | POA: Diagnosis present

## 2020-03-17 DIAGNOSIS — Z87891 Personal history of nicotine dependence: Secondary | ICD-10-CM

## 2020-03-17 DIAGNOSIS — O26893 Other specified pregnancy related conditions, third trimester: Secondary | ICD-10-CM | POA: Diagnosis present

## 2020-03-17 DIAGNOSIS — O0990 Supervision of high risk pregnancy, unspecified, unspecified trimester: Secondary | ICD-10-CM

## 2020-03-17 DIAGNOSIS — O4202 Full-term premature rupture of membranes, onset of labor within 24 hours of rupture: Secondary | ICD-10-CM | POA: Diagnosis not present

## 2020-03-17 DIAGNOSIS — Z62810 Personal history of physical and sexual abuse in childhood: Secondary | ICD-10-CM

## 2020-03-17 DIAGNOSIS — Z3A39 39 weeks gestation of pregnancy: Secondary | ICD-10-CM | POA: Diagnosis not present

## 2020-03-17 DIAGNOSIS — F3131 Bipolar disorder, current episode depressed, mild: Secondary | ICD-10-CM

## 2020-03-17 DIAGNOSIS — O479 False labor, unspecified: Secondary | ICD-10-CM | POA: Diagnosis present

## 2020-03-17 LAB — RESP PANEL BY RT-PCR (FLU A&B, COVID) ARPGX2
Influenza A by PCR: NEGATIVE
Influenza B by PCR: NEGATIVE
SARS Coronavirus 2 by RT PCR: NEGATIVE

## 2020-03-17 LAB — RPR: RPR Ser Ql: NONREACTIVE

## 2020-03-17 LAB — CBC
HCT: 35.2 % — ABNORMAL LOW (ref 36.0–46.0)
Hemoglobin: 11.6 g/dL — ABNORMAL LOW (ref 12.0–15.0)
MCH: 28.4 pg (ref 26.0–34.0)
MCHC: 33 g/dL (ref 30.0–36.0)
MCV: 86.1 fL (ref 80.0–100.0)
Platelets: 197 10*3/uL (ref 150–400)
RBC: 4.09 MIL/uL (ref 3.87–5.11)
RDW: 14.3 % (ref 11.5–15.5)
WBC: 12.3 10*3/uL — ABNORMAL HIGH (ref 4.0–10.5)
nRBC: 0 % (ref 0.0–0.2)

## 2020-03-17 LAB — TYPE AND SCREEN
ABO/RH(D): A POS
Antibody Screen: NEGATIVE

## 2020-03-17 MED ORDER — PHENYLEPHRINE 40 MCG/ML (10ML) SYRINGE FOR IV PUSH (FOR BLOOD PRESSURE SUPPORT)
80.0000 ug | PREFILLED_SYRINGE | INTRAVENOUS | Status: DC | PRN
  Administered 2020-03-17 (×2): 80 ug via INTRAVENOUS

## 2020-03-17 MED ORDER — SODIUM CHLORIDE (PF) 0.9 % IJ SOLN
INTRAMUSCULAR | Status: DC | PRN
  Administered 2020-03-17: 12 mL/h via EPIDURAL

## 2020-03-17 MED ORDER — LIDOCAINE HCL (PF) 1 % IJ SOLN: 30.0000 mL | INTRAMUSCULAR | Status: DC | PRN

## 2020-03-17 MED ORDER — SOD CITRATE-CITRIC ACID 500-334 MG/5ML PO SOLN: 30.0000 mL | ORAL | Status: DC | PRN

## 2020-03-17 MED ORDER — OXYCODONE-ACETAMINOPHEN 5-325 MG PO TABS: 2.0000 | ORAL_TABLET | ORAL | Status: DC | PRN

## 2020-03-17 MED ORDER — OXYCODONE-ACETAMINOPHEN 5-325 MG PO TABS: 1.0000 | ORAL_TABLET | ORAL | Status: DC | PRN

## 2020-03-17 MED ORDER — EPHEDRINE 5 MG/ML INJ: 10.0000 mg | INTRAVENOUS | Status: DC | PRN

## 2020-03-17 MED ORDER — FENTANYL-BUPIVACAINE-NACL 0.5-0.125-0.9 MG/250ML-% EP SOLN
12.0000 mL/h | EPIDURAL | Status: DC | PRN
  Filled 2020-03-17: qty 250

## 2020-03-17 MED ORDER — ACETAMINOPHEN 325 MG PO TABS
650.0000 mg | ORAL_TABLET | ORAL | Status: DC | PRN
  Administered 2020-03-17: 650 mg via ORAL
  Filled 2020-03-17: qty 2

## 2020-03-17 MED ORDER — LACTATED RINGERS IV SOLN: INTRAVENOUS | Status: DC

## 2020-03-17 MED ORDER — DIPHENHYDRAMINE HCL 50 MG/ML IJ SOLN: 12.5000 mg | INTRAMUSCULAR | Status: DC | PRN

## 2020-03-17 MED ORDER — ONDANSETRON HCL 4 MG/2ML IJ SOLN: 4.0000 mg | INTRAMUSCULAR | Status: DC | PRN

## 2020-03-17 MED ORDER — DIPHENHYDRAMINE HCL 25 MG PO CAPS: 25.0000 mg | ORAL_CAPSULE | Freq: Four times a day (QID) | ORAL | Status: DC | PRN

## 2020-03-17 MED ORDER — BENZOCAINE-MENTHOL 20-0.5 % EX AERO
1.0000 "application " | INHALATION_SPRAY | CUTANEOUS | Status: DC | PRN
  Filled 2020-03-17: qty 56

## 2020-03-17 MED ORDER — ONDANSETRON HCL 4 MG/2ML IJ SOLN: 4.0000 mg | Freq: Four times a day (QID) | INTRAMUSCULAR | Status: DC | PRN

## 2020-03-17 MED ORDER — LIDOCAINE HCL (PF) 1 % IJ SOLN
INTRAMUSCULAR | Status: DC | PRN
  Administered 2020-03-17 (×2): 4 mL via EPIDURAL

## 2020-03-17 MED ORDER — IBUPROFEN 600 MG PO TABS
600.0000 mg | ORAL_TABLET | Freq: Four times a day (QID) | ORAL | Status: DC
  Administered 2020-03-17 – 2020-03-18 (×5): 600 mg via ORAL
  Filled 2020-03-17 (×5): qty 1

## 2020-03-17 MED ORDER — PHENYLEPHRINE 40 MCG/ML (10ML) SYRINGE FOR IV PUSH (FOR BLOOD PRESSURE SUPPORT)
80.0000 ug | PREFILLED_SYRINGE | INTRAVENOUS | Status: DC | PRN
  Filled 2020-03-17: qty 10

## 2020-03-17 MED ORDER — OXYTOCIN-SODIUM CHLORIDE 30-0.9 UT/500ML-% IV SOLN
2.5000 [IU]/h | INTRAVENOUS | Status: DC
Start: 2020-03-17 — End: 2020-03-17
  Filled 2020-03-17: qty 500

## 2020-03-17 MED ORDER — ZOLPIDEM TARTRATE 5 MG PO TABS: 5.0000 mg | ORAL_TABLET | Freq: Every evening | ORAL | Status: DC | PRN

## 2020-03-17 MED ORDER — DIBUCAINE (PERIANAL) 1 % EX OINT
1.0000 "application " | TOPICAL_OINTMENT | CUTANEOUS | Status: DC | PRN
  Administered 2020-03-17: 1 via RECTAL
  Filled 2020-03-17: qty 28

## 2020-03-17 MED ORDER — COCONUT OIL OIL: 1.0000 "application " | TOPICAL_OIL | Status: DC | PRN

## 2020-03-17 MED ORDER — LACTATED RINGERS IV SOLN: 500.0000 mL | Freq: Once | INTRAVENOUS | Status: DC

## 2020-03-17 MED ORDER — TETANUS-DIPHTH-ACELL PERTUSSIS 5-2.5-18.5 LF-MCG/0.5 IM SUSY: 0.5000 mL | PREFILLED_SYRINGE | Freq: Once | INTRAMUSCULAR | Status: DC

## 2020-03-17 MED ORDER — OXYTOCIN BOLUS FROM INFUSION
333.0000 mL | Freq: Once | INTRAVENOUS | Status: AC
  Administered 2020-03-17: 09:00:00 333 mL via INTRAVENOUS

## 2020-03-17 MED ORDER — ONDANSETRON HCL 4 MG PO TABS: 4.0000 mg | ORAL_TABLET | ORAL | Status: DC | PRN

## 2020-03-17 MED ORDER — WITCH HAZEL-GLYCERIN EX PADS
1.0000 "application " | MEDICATED_PAD | CUTANEOUS | Status: DC | PRN
  Administered 2020-03-17: 1 via TOPICAL

## 2020-03-17 MED ORDER — SIMETHICONE 80 MG PO CHEW: 80.0000 mg | CHEWABLE_TABLET | ORAL | Status: DC | PRN

## 2020-03-17 MED ORDER — PRENATAL MULTIVITAMIN CH
1.0000 | ORAL_TABLET | Freq: Every day | ORAL | Status: DC
  Filled 2020-03-17: qty 1

## 2020-03-17 MED ORDER — SENNOSIDES-DOCUSATE SODIUM 8.6-50 MG PO TABS
2.0000 | ORAL_TABLET | ORAL | Status: DC
Start: 2020-03-17 — End: 2020-03-18
  Filled 2020-03-17: qty 2

## 2020-03-17 MED ORDER — LACTATED RINGERS IV SOLN
500.0000 mL | INTRAVENOUS | Status: DC | PRN
  Administered 2020-03-17: 07:00:00 250 mL via INTRAVENOUS
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
## 2020-03-17 NOTE — Lactation Note (Signed)
This note was copied from a baby's chart. Lactation Consultation Note  Patient Name: Connie Cabrera [id number] Date: 03/17/2020   Infant seen at 40 min of life. Mom is a P3; she has never had an infant to latch (b/c of her hx of inverted nipples). Mom's nipples are everted; infant latched with relative ease with assistance from my Lactation student, Passion Smith (swallows verified by cervical auscultation).   Mom pumped for 2 months with one child and for 2 days with the other.   Mom's questions were answered--Mom understands this may be the best feeding infant has the 1st day as infant may become sleepy, but Mom knows she can call for assist to help with feeding if infant is too sleepy to eat.   Infant was still actively suckling when I left room (at the 8 min mark).  Lurline Hare [facility] [place] 03/17/2020, 9:23 AM
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
## 2020-03-17 NOTE — Anesthesia Preprocedure Evaluation (Signed)
Anesthesia Evaluation  Patient identified by MRN, date of birth, ID band Patient awake    Reviewed: Allergy & Precautions, Patient's Chart, lab work & pertinent test results  History of Anesthesia Complications Negative for: history of anesthetic complications  Airway Mallampati: II  TM Distance: >3 FB Neck ROM: Full    Dental no notable dental hx.    Pulmonary asthma , former smoker,    Pulmonary exam normal        Cardiovascular negative cardio ROS Normal cardiovascular exam     Neuro/Psych Anxiety Depression Bipolar Disorder negative neurological ROS     GI/Hepatic negative GI ROS, Neg liver ROS,   Endo/Other  negative endocrine ROS  Renal/GU negative Renal ROS  negative genitourinary   Musculoskeletal negative musculoskeletal ROS (+)   Abdominal   Peds  Hematology negative hematology ROS (+)   Anesthesia Other Findings Day of surgery medications reviewed with patient.  Reproductive/Obstetrics (+) Pregnancy                             Anesthesia Physical Anesthesia Plan  ASA: II  Anesthesia Plan: Epidural   Post-op Pain Management:    Induction:   PONV Risk Score and Plan: Treatment may vary due to age or medical condition  Airway Management Planned: Natural Airway  Additional Equipment:   Intra-op Plan:   Post-operative Plan:   Informed Consent: I have reviewed the patients History and Physical, chart, labs and discussed the procedure including the risks, benefits and alternatives for the proposed anesthesia with the patient or authorized representative who has indicated his/her understanding and acceptance.       Plan Discussed with:   Anesthesia Plan Comments:         Anesthesia Quick Evaluation

## 2020-03-17 NOTE — Anesthesia Procedure Notes (Signed)
Epidural Patient location during procedure: OB Start time: 03/17/2020 6:12 AM End time: 03/17/2020 6:16 AM  Staffing Anesthesiologist: Kaylyn Layer, MD Performed: anesthesiologist   Preanesthetic Checklist Completed: patient identified, IV checked, risks and benefits discussed, monitors and equipment checked, pre-op evaluation and timeout performed  Epidural Patient position: sitting Prep: DuraPrep and site prepped and draped Patient monitoring: continuous pulse ox, blood pressure and heart rate Approach: midline Location: L3-L4 Injection technique: LOR air  Needle:  Needle type: Tuohy  Needle gauge: 17 G Needle length: 9 cm Needle insertion depth: 7 cm Catheter type: closed end flexible Catheter size: 19 Gauge Catheter at skin depth: 12 cm Test dose: negative and Other (1% lidocaine)  Assessment Events: blood not aspirated, injection not painful, no injection resistance, no paresthesia and negative IV test  Additional Notes Patient identified. Risks, benefits, and alternatives discussed with patient including but not limited to bleeding, infection, nerve damage, paralysis, failed block, incomplete pain control, headache, blood pressure changes, nausea, vomiting, reactions to medication, itching, and postpartum back pain. Confirmed with bedside nurse the patient's most recent platelet count. Confirmed with patient that they are not currently taking any anticoagulation, have any bleeding history, or any family history of bleeding disorders. Patient expressed understanding and wished to proceed. All questions were answered. Sterile technique was used throughout the entire procedure. Please see nursing notes for vital signs.   Crisp LOR on second attempt, somewhat difficult due to inadequate patient positioning. Test dose was given through epidural catheter and negative prior to continuing to dose epidural or start infusion. Warning signs of high block given to the patient  including shortness of breath, tingling/numbness in hands, complete motor block, or any concerning symptoms with instructions to call for help. Patient was given instructions on fall risk and not to get out of bed. All questions and concerns addressed with instructions to call with any issues or inadequate analgesia.  Reason for block:procedure for pain

## 2020-03-17 NOTE — Progress Notes (Signed)
Patient ID: Connie Cabrera, female   DOB: [birth year], 29 y.o.   MRN: [id number] Got epidural and is comfortable, but is anxious about hurting in future Have been struggling with BPs some since epidural placed  Just got Foley placed but RN states patient did not tolerate well due to anxiety, so they are letting her rest now  Vitals:   03/17/20 0656 03/17/20 0700 03/17/20 0704 03/17/20 0709  BP: 91/66  (!) 89/67 (!) 89/67  Pulse: (!) 109  (!) 105 (!) 105  Resp:    16  Temp:   97.6 F (36.4 C) 97.6 F (36.4 C)  TempSrc:   Axillary Axillary  SpO2: 99% 99%    Weight:    88.9 kg  Height:    5\' 7"  (1.702 m)   FHR stable UCs regular  Dilation: 6.5 Effacement (%): 90 Station: -3 Presentation: Vertex Exam by:: [email or web address], CNM  Anticipate SVD

## 2020-03-17 NOTE — MAU Note (Signed)
PT SAYS  SHE WAS HERE YESTERDAY -  SENT HOME AT 3 CM -  HAS RETURNED - WITH [facility]'S  STRONGER.
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
## 2020-03-18 MED ORDER — IBUPROFEN 600 MG PO TABS
600.0000 mg | ORAL_TABLET | Freq: Four times a day (QID) | ORAL | 0 refills | Status: DC
Start: 2020-03-18 — End: 2022-09-18

## 2020-03-18 NOTE — Anesthesia Postprocedure Evaluation (Signed)
Anesthesia Post Note  Patient: Connie Cabrera  Procedure(s) Performed: AN AD HOC LABOR EPIDURAL     Patient location during evaluation: Mother Baby Anesthesia Type: Epidural Level of consciousness: awake and alert Pain management: pain level controlled Vital Signs Assessment: post-procedure vital signs reviewed and stable Respiratory status: spontaneous breathing Cardiovascular status: blood pressure returned to baseline Postop Assessment: no headache, adequate PO intake, no backache, patient able to bend at knees, able to ambulate, epidural receding and no apparent nausea or vomiting Anesthetic complications: no   No complications documented.  Last Vitals:  Vitals:   03/18/20 0036 03/18/20 0522  BP: 101/68 102/67  Pulse: 68 63  Resp: 17 16  Temp: 37 C 36.8 C  SpO2: 99% 100%    Last Pain:  Vitals:   03/18/20 0522  TempSrc: Oral  PainSc:    Pain Goal: Patients Stated Pain Goal: 5 (03/17/20 0945)                 Salome Arnt

## 2020-03-18 NOTE — Lactation Note (Signed)
This note was copied from a baby's chart. Lactation Consultation Note  Patient Name: Connie Cabrera [id number] Date: 03/18/2020 Reason for consult: Follow-up assessment;Other (Comment) (per [facility] RN C. Hubbard - mom has changed to just formula feed)   Maternal Data    Feeding Feeding Type: Bottle Fed - Formula Nipple Type: Extra Slow Flow  LATCH Score                   Interventions    Lactation Tools Discussed/Used     Consult Status Consult Status: Complete Date: 03/18/20    Kathrin Greathouse 03/18/2020, 12:34 PM

## 2020-03-18 NOTE — Discharge Instructions (Signed)

## 2020-03-23 ENCOUNTER — Encounter: Payer: Self-pay | Admitting: Obstetrics and Gynecology

## 2020-03-23 ENCOUNTER — Inpatient Hospital Stay (HOSPITAL_COMMUNITY): Admission: AD | Admit: 2020-03-23 | Payer: Medicaid Other | Source: Home / Self Care

## 2020-04-20 ENCOUNTER — Encounter: Payer: Self-pay | Admitting: Family Medicine

## 2020-04-20 ENCOUNTER — Ambulatory Visit (INDEPENDENT_AMBULATORY_CARE_PROVIDER_SITE_OTHER): Payer: Medicaid Other | Admitting: Family Medicine

## 2020-04-20 ENCOUNTER — Other Ambulatory Visit: Payer: Self-pay

## 2020-04-20 DIAGNOSIS — F3131 Bipolar disorder, current episode depressed, mild: Secondary | ICD-10-CM

## 2020-04-20 DIAGNOSIS — Z62810 Personal history of physical and sexual abuse in childhood: Secondary | ICD-10-CM

## 2020-04-20 DIAGNOSIS — O99893 Other specified diseases and conditions complicating puerperium: Secondary | ICD-10-CM | POA: Diagnosis not present

## 2020-04-20 DIAGNOSIS — Z30011 Encounter for initial prescription of contraceptive pills: Secondary | ICD-10-CM

## 2020-04-20 DIAGNOSIS — O99345 Other mental disorders complicating the puerperium: Secondary | ICD-10-CM

## 2020-04-20 DIAGNOSIS — N96 Recurrent pregnancy loss: Secondary | ICD-10-CM

## 2020-04-20 DIAGNOSIS — R87610 Atypical squamous cells of undetermined significance on cytologic smear of cervix (ASC-US): Secondary | ICD-10-CM

## 2020-04-20 MED ORDER — NORGESTIMATE-ETH ESTRADIOL 0.25-35 MG-MCG PO TABS: 1.0000 | ORAL_TABLET | Freq: Every day | ORAL | 11 refills | Status: DC

## 2020-04-20 NOTE — Progress Notes (Signed)
Pt scored 11 on Edinburgh but does not want to talk with Asher Muir, Pt states only wants Meds. Wants BC Pills.
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
## 2020-04-20 NOTE — Patient Instructions (Signed)
AREA FAMILY PRACTICE PHYSICIANS  [place] ([address]) . [facility] o [address] [place], [place] [address] o [phone] o Mon-Fri 8:30-12:30, 1:30-5:00 o Accepting Medicaid . [facility] o [address], [place], [place] [address] o [phone] o Mon-Fri 8:00-5:30 . [facility] o [address] [place], [place] [address] o [phone] o Mon, Tue, Thur, Fri 8:30-5:00, Wed 10:00-7:00 (closed 1-2pm) o Accepting Medicaid . [facility] o [address], [place], [place]  [address] o Phone - [phone]   Fax - [phone]  [place] ([address]) . [facility] o [address] [place], [place] [address] o [phone] o Mon-Fri 8:00-5:00 . [facility] ([facility]  o [address] [place], [place] [address] o [phone] o Mon-Fri 8:30-5:30, Sat (Oct.-Mar.) 9:00-1:00 o Accepting Medicaid  [place] ([address]) . [facility] o [address], [place], [place] [address] o [phone] o Mon-Fri 8:00-5:00  [place] ([address]) . [facility] o [address], [place], [place] [address] o [phone] o Mon-Fri 8:00-5:00 . [facility] o [address], [place], [place] [address] o [phone] o Mon-Fri 8:00-5:00 . [facility] o [address] [place], [place] [address] o [phone] o Mon-Fri 8:00-5:00 . [facility] o [address] [place] [address] o [phone] o Mon-Fri 7:30-5:30  [place] ([address] & [address]) . [facility] o [address] [place], [place] [address] o [phone] o Mon-Thur 8:00-6:00 o Accepting Medicaid . [facility] o [address] [place], [place] [address] o [phone] o Mon-Thur 7:30-7:30, Fri 7:30-4:30 o Accepting Medicaid . [facility] o [address], [place], [place]  [address] o [phone]   Fax - [phone]  [place] ([address]) . [facility] o [address] [place], [place] [address] o [phone] o Mon-Fri 7:00-5:00 . [facility] o [address], [place], [place] [address] o [phone] o Mon-Fri 8:00-5:00 o Accepting Medicaid . [facility] o [address], , [place] [address] o [phone] o Mon-Fri 8:00-5:00 o Accepting Medicaid  [place] ([address]) . [facility] [place] o [address] [place], [place] [address] o [phone] o Mon-Fri 8:00-5:00 . [facility] o [address], [place], [place] [address] o [phone] o Mon-Fri 8:00-5:00 o Accepting Medicaid . [facility] ([facility] o [address], [place], [place] [address] o [phone] o Mon-Fri 8:00-5:30, Sat&Sun by appointment (phones open at 8:30) o Accepting Medicaid  [place] ([address]) . [facility] o [address] [place], [place] [address] o [phone] o Mon-Thur 8:00-7:00, Fri 8:00-5:00, Sat 8:00-12:00, Sun 9:00-12:00 o Accepting Medicaid . [facility] [place] o [address], [place], [place] [address] o [phone] o Mon-Thur 8:00-5:00 o Accepting Medicaid . [facility] o [address] [place],  [address] o [phone] o Mon-Fri 8:00-5:30, Sat (Oct.-Mar.) 9:00-1:00 o Accepting Medicaid  [place]  ([address]) .   [facility] o [address], [place], [place] [address] o [phone] o Mon-Fri 8:00-5:00 o Accepting Medicaid   [place] ([address]) . [facility] [place] o [address], [place], [place] [address] o [phone] o Mon-Fri 8:00-5:00 . [facility] [place] o [address], [place], [place] [address] o [phone] o Mon-Fri 8:00-5:00 . [facility] [place] o [address] [place] [address], [place], [place] [address] o [phone] o Mon-Fri 8:00-5:00 o After hours clinic ([address] [place], [place] [address]) [phone] Mon-Fri 5:00-8:00, Sat 12:00-6:00, Sun 10:00-4:00 o Accepting Medicaid . [facility] [place] o [address], [place], [place]  [address] o [phone]   Fax - [phone]  [place] ([address]) . [facility] [place] o [address], [place], [place] [address] o [phone] o Mon-Fri 8:00-5:00 . [facility] [place] ([facility] [place]) o [address], [place], [place] [address] o [phone] o Mon-Thur 8:00-7:00, Fri 8:00-5:00, Sat 8:00-12:00    

## 2020-04-22 NOTE — Addendum Note (Signed)
Addended by: Hulda Marin C on: 04/22/2020 08:08 AM   Modules accepted: Orders

## 2020-05-11 NOTE — BH Specialist Note (Deleted)
Integrated Behavioral Health via Telemedicine Visit  05/11/2020 Connie Cabrera [id number]  Number of Integrated Behavioral Health visits: 1 Session Start time: 1:15***  Session End time: 2:15*** Total time: {IBH Total Time:21014050}  Referring Provider: *** Patient/Family location: Home*** [facility] Provider location: [facility]  All persons participating in visit: Patient *** and [facility] Goran Olden ***  Types of Service: {CHL AMB TYPE OF [phone]}  I connected with Jamey Demchak and/or Kathelene Amiri's {family members:20773} by Telephone  (Video is Surveyor, mining) and verified that I am speaking with the correct person using two identifiers.Discussed confidentiality: {YES/NO:21197}  I discussed the limitations of telemedicine and the availability of in person appointments.  Discussed there is a possibility of technology failure and discussed alternative modes of communication if that failure occurs.  I discussed that engaging in this telemedicine visit, they consent to the provision of behavioral healthcare and the services will be billed under their insurance.  Patient and/or legal guardian expressed understanding and consented to Telemedicine visit: {YES/NO:21197}  Presenting Concerns: Patient and/or family reports the following symptoms/concerns: *** Duration of problem: ***; Severity of problem: {Mild/Moderate/Severe:20260}  Patient and/or Family's Strengths/Protective Factors: {CHL AMB BH PROTECTIVE FACTORS:[phone]}  Goals Addressed: Patient will: 1.  Reduce symptoms of: {IBH Symptoms:21014056}  2.  Increase knowledge and/or ability of: {IBH Patient Tools:21014057}  3.  Demonstrate ability to: {IBH Goals:21014053}  Progress towards Goals: {CHL AMB BH PROGRESS TOWARDS GOALS:[phone]}  Interventions: Interventions utilized:  {IBH Interventions:21014054} Standardized Assessments  completed: {IBH Screening Tools:21014051}  Patient and/or Family Response: ***  Assessment: Patient currently experiencing ***.   Patient may benefit from ***.  Plan: 1. Follow up with behavioral health clinician on : *** 2. Behavioral recommendations: *** 3. Referral(s): {IBH Referrals:21014055}  I discussed the assessment and treatment plan with the patient and/or parent/guardian. They were provided an opportunity to ask questions and all were answered. They agreed with the plan and demonstrated an understanding of the instructions.   They were advised to call back or seek an in-person evaluation if the symptoms worsen or if the condition fails to improve as anticipated.  Valetta Close Riad Wagley, LCSW

## 2020-05-18 ENCOUNTER — Ambulatory Visit: Payer: Medicaid Other | Admitting: Family Medicine

## 2020-05-21 NOTE — BH Specialist Note (Deleted)
Integrated Behavioral Health via Telemedicine Visit  05/21/2020 Kenneisha Cochrane [id number]  Number of Integrated Behavioral Health visits: 1 Session Start time: 8:15***  Session End time: 9:15*** Total time: {IBH Total CHYI:[id number]}  Referring Provider: Leroy Libman, MD Patient/Family location: Home*** [facility] Provider location: Center for [facility]  All persons participating in visit: Patient *** and [facility] Launi Asencio ***  Types of Service: {CHL AMB TYPE OF [phone]}  I connected with Lacora Folmer and/or Chanie Cranford's {family members:20773} by Telephone  (Video is Surveyor, mining) and verified that I am speaking with the correct person using two identifiers.Discussed confidentiality: {YES/NO:21197}  I discussed the limitations of telemedicine and the availability of in person appointments.  Discussed there is a possibility of technology failure and discussed alternative modes of communication if that failure occurs.  I discussed that engaging in this telemedicine visit, they consent to the provision of behavioral healthcare and the services will be billed under their insurance.  Patient and/or legal guardian expressed understanding and consented to Telemedicine visit: {YES/NO:21197}  Presenting Concerns: Patient and/or family reports the following symptoms/concerns: *** Duration of problem: ***; Severity of problem: {Mild/Moderate/Severe:20260}  Patient and/or Family's Strengths/Protective Factors: {CHL AMB BH PROTECTIVE FACTORS:[phone]}  Goals Addressed: Patient will: 1.  Reduce symptoms of: {IBH Symptoms:21014056}  2.  Increase knowledge and/or ability of: {IBH Patient Tools:21014057}  3.  Demonstrate ability to: {IBH Goals:21014053}  Progress towards Goals: {CHL AMB BH PROGRESS TOWARDS GOALS:[phone]}  Interventions: Interventions utilized:  {IBH Interventions:21014054} Standardized  Assessments completed: {IBH Screening Tools:21014051}  Patient and/or Family Response: ***  Assessment: Patient currently experiencing ***.   Patient may benefit from ***.  Plan: 1. Follow up with behavioral health clinician on : *** 2. Behavioral recommendations: *** 3. Referral(s): {IBH Referrals:21014055}  I discussed the assessment and treatment plan with the patient and/or parent/guardian. They were provided an opportunity to ask questions and all were answered. They agreed with the plan and demonstrated an understanding of the instructions.   They were advised to call back or seek an in-person evaluation if the symptoms worsen or if the condition fails to improve as anticipated.  Valetta Close Anfernee Peschke, LCSW

## 2020-05-25 ENCOUNTER — Other Ambulatory Visit (HOSPITAL_COMMUNITY)
Admission: RE | Admit: 2020-05-25 | Discharge: 2020-05-25 | Disposition: A | Discharge status: Home / Self Care | Payer: Medicaid Other | Source: Ambulatory Visit | Attending: Family Medicine | Admitting: Family Medicine

## 2020-05-25 ENCOUNTER — Encounter: Payer: Self-pay | Admitting: Family Medicine

## 2020-05-25 ENCOUNTER — Other Ambulatory Visit: Payer: Self-pay

## 2020-05-25 ENCOUNTER — Ambulatory Visit (INDEPENDENT_AMBULATORY_CARE_PROVIDER_SITE_OTHER): Payer: Medicaid Other | Admitting: Family Medicine

## 2020-05-25 VITALS — BP 124/74 | HR 69 | Wt 182.2 lb

## 2020-05-25 DIAGNOSIS — R87610 Atypical squamous cells of undetermined significance on cytologic smear of cervix (ASC-US): Secondary | ICD-10-CM | POA: Diagnosis present
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
## 2020-05-25 NOTE — Addendum Note (Signed)
Addended by: Marjo Bicker on: 05/25/2020 03:59 PM   Modules accepted: Orders

## 2020-05-31 LAB — CYTOLOGY - PAP
Chlamydia: NEGATIVE
Comment: NEGATIVE
Comment: NEGATIVE
Comment: NEGATIVE
Comment: NEGATIVE
Comment: NORMAL
Diagnosis: NEGATIVE
HPV 16: POSITIVE — AB
HPV 18 / 45: NEGATIVE
High risk HPV: POSITIVE — AB
Neisseria Gonorrhea: NEGATIVE
Trichomonas: POSITIVE — AB

## 2020-06-01 ENCOUNTER — Telehealth: Payer: Self-pay

## 2020-06-01 MED ORDER — METRONIDAZOLE 500 MG PO TABS: 500.0000 mg | ORAL_TABLET | Freq: Two times a day (BID) | ORAL | 1 refills | Status: DC

## 2020-06-01 NOTE — Telephone Encounter (Signed)
Called pt in regards to her results on pap smear of positive HPV and Trich.  Reviewed colposcopy results with Dr. Crissie Reese who recommends that pt have a colposcopy.  Pt explained about the colposcopy procedure and she stated that she is nervous and that they could not get her to maintain on the table for a long period of time while doing the pap smear.  I advised pt that the procedure usually takes about 5-10 minutes and the provider may not need to biopsy anything.  I informed pt that her partner(s) as well needed to be treated as well.  Pt informs me that her partner is a truck driver and was not able to get an appt with a doctor until May.  I advised that we can send a prescription of Flagyl for him as well however it will be placed as a refill if she can make sure that you takes it as well.  Pt requested that the appt for colpo be made in the morning after she drops the children off at daycare and she will get appt via MyChart.  Pt did not have any further questions.    Connie Cabrera  06/01/20

## 2020-06-01 NOTE — Telephone Encounter (Signed)
Pt called and stated that she is having some itchiness on the lips of her vagina can she get something prescribed for that.  Pt also asked if she needed a sooner appt than April for her colposcopy because she does not want to get cancer and she is very nervous.  Called pt and explained to the pt that she does not have cancer as her pap was negative for abnormal cells on the cervix however the colposcopy is to follow up with positive HPV and that the April appt is fine.  I also advised pt that she can try to use hydrocortisone cream to see if that helps.  Pt verbalizes understanding with no further questions.  Addison Naegeli, RN  06/01/20

## 2020-06-07 ENCOUNTER — Other Ambulatory Visit (HOSPITAL_COMMUNITY)
Admission: RE | Admit: 2020-06-07 | Discharge: 2020-06-07 | Disposition: A | Discharge status: Home / Self Care | Payer: Medicaid Other | Source: Ambulatory Visit | Attending: Family Medicine | Admitting: Family Medicine

## 2020-06-07 ENCOUNTER — Ambulatory Visit (INDEPENDENT_AMBULATORY_CARE_PROVIDER_SITE_OTHER): Payer: Medicaid Other | Admitting: Obstetrics and Gynecology

## 2020-06-07 ENCOUNTER — Encounter: Payer: Self-pay | Admitting: Obstetrics and Gynecology

## 2020-06-07 ENCOUNTER — Other Ambulatory Visit: Payer: Self-pay

## 2020-06-07 VITALS — BP 113/76 | HR 73 | Ht 67.5 in | Wt 180.6 lb

## 2020-06-07 DIAGNOSIS — N870 Mild cervical dysplasia: Secondary | ICD-10-CM | POA: Diagnosis present

## 2020-06-07 DIAGNOSIS — Z3040 Encounter for surveillance of contraceptives, unspecified: Secondary | ICD-10-CM

## 2020-06-07 DIAGNOSIS — Z3202 Encounter for pregnancy test, result negative: Secondary | ICD-10-CM | POA: Diagnosis not present

## 2020-06-07 LAB — POCT PREGNANCY, URINE: Preg Test, Ur: NEGATIVE

## 2020-06-07 MED ORDER — NORELGESTROMIN-ETH ESTRADIOL 150-35 MCG/24HR TD PTWK: 1.0000 | MEDICATED_PATCH | TRANSDERMAL | 3 refills | Status: DC

## 2020-06-07 NOTE — Procedures (Signed)
Colposcopy Procedure Note  Pre-operative Diagnosis:  05/2020 pap: negative pap but HPV 16+ 03/2017 pap: ASCUS/HPV negative  Post-operative Diagnosis: CIN 1  Procedure Details  UPT negative.    The risks (including infection, bleeding, pain) and benefits of the procedure were explained to the patient and written informed consent was obtained.  The patient was placed in the dorsal lithotomy position. A Pederson was speculum inserted in the vagina, and the cervix was visualized.  AA staining done Lugol's with green filter.  Biopsy from 6 o'clock and then single toothed tenaculum applied and ECC in all four quadrants done. No bleeding after procedure  Findings: diffuse AWE changes with some increased vascularity at 6 o'clock  Adequate: Yes  Specimens: 6 o'clock and endocervical curettage  Condition: Stable  Complications: None  Plan: The patient was advised to call for any fever or for prolonged or severe pain or bleeding. She was advised to use OTC analgesics as needed for mild to moderate pain. She was advised to avoid vaginal intercourse for 48 hours or until the bleeding has completely stopped.  Patient with some tobacco smoke exposure and I recommended she eliminate this as much as possible  Patient would like to do patch after d/w her re: various methods and qday method not good for her. I d/w her re: how to start these.  RTC in a few weeks for trich [facility]  Cornelia Copa MD Attending Center for [facility] [employer])

## 2020-06-09 ENCOUNTER — Telehealth: Payer: Self-pay | Admitting: Lactation Services

## 2020-06-09 LAB — SURGICAL PATHOLOGY

## 2020-06-09 NOTE — Telephone Encounter (Signed)
Called patient at her request. Patient did not answer and mailbox full so not able to leave a message. Will send My Chart message.

## 2020-06-10 ENCOUNTER — Encounter: Payer: Self-pay | Admitting: Obstetrics and Gynecology

## 2020-06-10 HISTORY — PX: COLPOSCOPY W/ BIOPSY / CURETTAGE: SUR283

## 2020-06-23 ENCOUNTER — Encounter: Payer: Self-pay | Admitting: General Practice

## 2020-06-24 ENCOUNTER — Ambulatory Visit: Payer: Medicaid Other | Admitting: Family Medicine

## 2020-07-26 DIAGNOSIS — L237 Allergic contact dermatitis due to plants, except food: Secondary | ICD-10-CM

## 2020-07-27 MED ORDER — PREDNISONE 10 MG (21) PO TBPK: ORAL_TABLET | ORAL | 0 refills | Status: AC

## 2021-10-03 ENCOUNTER — Ambulatory Visit
Admission: EM | Admit: 2021-10-03 | Discharge: 2021-10-03 | Disposition: A | Discharge status: Home / Self Care | Payer: Medicaid Other | Attending: Internal Medicine | Admitting: Internal Medicine

## 2021-10-03 DIAGNOSIS — T7840XA Allergy, unspecified, initial encounter: Secondary | ICD-10-CM | POA: Diagnosis not present

## 2021-10-03 DIAGNOSIS — R21 Rash and other nonspecific skin eruption: Secondary | ICD-10-CM | POA: Diagnosis not present

## 2021-10-03 MED ORDER — EPINEPHRINE 0.3 MG/0.3ML IJ SOAJ: 0.3000 mg | Freq: Once | INTRAMUSCULAR | Status: DC

## 2021-10-03 MED ORDER — FAMOTIDINE 20 MG PO TABS
20.0000 mg | ORAL_TABLET | Freq: Once | ORAL | Status: AC
  Administered 2021-10-03: 20 mg via ORAL

## 2021-10-03 MED ORDER — PREDNISONE 20 MG PO TABS
40.0000 mg | ORAL_TABLET | Freq: Every day | ORAL | 0 refills | Status: AC
Start: 2021-10-03 — End: 2021-10-06

## 2021-10-03 MED ORDER — FAMOTIDINE 20 MG PO TABS
20.0000 mg | ORAL_TABLET | Freq: Two times a day (BID) | ORAL | 0 refills | Status: DC
Start: 2021-10-03 — End: 2022-09-18

## 2021-10-03 MED ORDER — METHYLPREDNISOLONE SODIUM SUCC 125 MG IJ SOLR
80.0000 mg | Freq: Once | INTRAMUSCULAR | Status: AC
  Administered 2021-10-03: 80 mg via INTRAMUSCULAR

## 2021-10-03 MED ORDER — DIPHENHYDRAMINE HCL 50 MG/ML IJ SOLN
50.0000 mg | Freq: Once | INTRAMUSCULAR | Status: AC
  Administered 2021-10-03: 50 mg via INTRAMUSCULAR
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
## 2021-10-03 NOTE — ED Triage Notes (Signed)
Patient presents to Urgent Care with complaints of facial swelling and bilateral rash on both arms and neck since yesterday. Patient reports benadryl po and cortisone cream.  Pt reports she had contact with bee sting on Friday and the poison ivy on saturday

## 2021-10-03 NOTE — Discharge Instructions (Signed)
You were given a steroid injection, Benadryl injection, Pepcid pill today in urgent care to help alleviate allergic reaction.  You were sent home with prednisone pills that you should start taking tomorrow given that you received a steroid injection in urgent care today.  You have also prescribed Pepcid that you will take twice daily.  Also recommend taking Benadryl every 6 hours that you can get over-the-counter but please be advised that it can cause drowsiness so do not drive while taking this.  Please go to the emergency department immediately or call 911 if symptoms persist or worsen.

## 2022-03-27 NOTE — L&D Delivery Note (Signed)
OB/GYN Faculty Practice Delivery Note  Connie Cabrera is a 32 y.o. [id number] s/p NSVD at [redacted]w[redacted]d. She was admitted for SOL.   ROM: 0h 28m with clear fluid GBS Status: Negative/-- (12/11 1121) Maximum Maternal Temperature: Temp (24hrs), Avg:97.9 F (36.6 C), Min:97.7 F (36.5 C), Max:98.1 F (36.7 C)   Labor Progress: Initial SVE: 5.5/80/-2. AROM requested by patient. She then progressed to complete.   Delivery Date/Time: 03/20/23 0900 Delivery: Called to room and patient was complete and pushing. Head delivered OA to ROA. No nuchal cord present. Shoulder and body delivered in usual fashion. Infant with spontaneous cry, placed on mother's abdomen, dried and stimulated. Cord clamped x 2 after 1-minute delay, and cut by FOB. Cord blood drawn. Placenta delivered spontaneously with gentle cord traction. Fundus firm with massage and Pitocin. Labia, perineum, and vagina inspected with 1st degree perineal tear. Figure-of-8 placed with 3-0 vicryl for hemostasis.  Baby Weight: pending  Placenta: 3 vessel, intact. Sent to L&D Complications: None Lacerations: 1st degree perineal EBL: 92 mL Anesthesia: epidural  Infant:  APGAR (1 MIN): 8 APGAR (5 MINS): 9 APGAR (10 MINS):    Joanne Gavel, MD [facility] Fellow, [facility] for [facility], [facility] 03/20/2023, 10:02 AM

## 2022-04-16 ENCOUNTER — Ambulatory Visit
Admission: EM | Admit: 2022-04-16 | Discharge: 2022-04-16 | Disposition: A | Discharge status: Home / Self Care | Payer: Medicaid Other | Attending: Emergency Medicine | Admitting: Emergency Medicine

## 2022-04-16 ENCOUNTER — Encounter: Payer: Self-pay | Admitting: Emergency Medicine

## 2022-04-16 ENCOUNTER — Other Ambulatory Visit: Payer: Self-pay

## 2022-04-16 DIAGNOSIS — J069 Acute upper respiratory infection, unspecified: Secondary | ICD-10-CM | POA: Diagnosis not present

## 2022-04-16 DIAGNOSIS — U071 COVID-19: Secondary | ICD-10-CM | POA: Diagnosis not present

## 2022-04-16 MED ORDER — PROMETHAZINE-DM 6.25-15 MG/5ML PO SYRP: 5.0000 mL | ORAL_SOLUTION | Freq: Every evening | ORAL | 0 refills | Status: DC | PRN

## 2022-04-16 MED ORDER — PREDNISONE 20 MG PO TABS
40.0000 mg | ORAL_TABLET | Freq: Every day | ORAL | 0 refills | Status: DC
Start: 2022-04-16 — End: 2022-09-18

## 2022-04-16 MED ORDER — BENZONATATE 100 MG PO CAPS
100.0000 mg | ORAL_CAPSULE | Freq: Three times a day (TID) | ORAL | 0 refills | Status: DC
Start: 2022-04-16 — End: 2022-09-18

## 2022-04-16 NOTE — Discharge Instructions (Signed)
Your symptoms today are most likely being caused by a virus and should steadily improve in time it can take up to 7 to 10 days before you truly start to see a turnaround however things will get better  Begin use of prednisone every morning with food for 5 days to reduce inflammation to the airway  You may use Tessalon Perles every 8 hours to help calm coughing  May use cough syrup at bedtime as needed for additional comfort    You can take Tylenol and/or Ibuprofen as needed for fever reduction and pain relief.   For cough: honey 1/2 to 1 teaspoon (you can dilute the honey in water or another fluid).  You can also use guaifenesin and dextromethorphan for cough. You can use a humidifier for chest congestion and cough.  If you don't have a humidifier, you can sit in the bathroom with the hot shower running.      For sore throat: try warm salt water gargles, cepacol lozenges, throat spray, warm tea or water with lemon/honey, popsicles or ice, or OTC cold relief medicine for throat discomfort.   For congestion: take a daily anti-histamine like Zyrtec, Claritin, and a oral decongestant, such as pseudoephedrine.  You can also use Flonase 1-2 sprays in each nostril daily.   It is important to stay hydrated: drink plenty of fluids (water, gatorade/powerade/pedialyte, juices, or teas) to keep your throat moisturized and help further relieve irritation/discomfort.
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
## 2022-04-16 NOTE — ED Triage Notes (Signed)
Pt here for cough and hoarse voice x 4 days

## 2022-04-17 LAB — SARS CORONAVIRUS 2 (TAT 6-24 HRS): SARS Coronavirus 2: POSITIVE — AB

## 2022-07-27 ENCOUNTER — Encounter: Payer: Self-pay | Admitting: Obstetrics and Gynecology

## 2022-07-27 DIAGNOSIS — Z3491 Encounter for supervision of normal pregnancy, unspecified, first trimester: Secondary | ICD-10-CM

## 2022-08-08 ENCOUNTER — Ambulatory Visit (INDEPENDENT_AMBULATORY_CARE_PROVIDER_SITE_OTHER): Payer: Medicaid Other

## 2022-08-08 DIAGNOSIS — Z3491 Encounter for supervision of normal pregnancy, unspecified, first trimester: Secondary | ICD-10-CM

## 2022-09-13 ENCOUNTER — Telehealth (INDEPENDENT_AMBULATORY_CARE_PROVIDER_SITE_OTHER): Payer: Medicaid Other

## 2022-09-13 DIAGNOSIS — O0991 Supervision of high risk pregnancy, unspecified, first trimester: Secondary | ICD-10-CM

## 2022-09-13 DIAGNOSIS — O0990 Supervision of high risk pregnancy, unspecified, unspecified trimester: Secondary | ICD-10-CM | POA: Insufficient documentation

## 2022-09-13 DIAGNOSIS — Z3A11 11 weeks gestation of pregnancy: Secondary | ICD-10-CM

## 2022-09-13 MED ORDER — BLOOD PRESSURE KIT DEVI: 1.0000 | 0 refills | Status: DC | PRN
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
## 2022-09-13 NOTE — Patient Instructions (Signed)

## 2022-09-18 ENCOUNTER — Other Ambulatory Visit: Payer: Self-pay

## 2022-09-18 ENCOUNTER — Ambulatory Visit (INDEPENDENT_AMBULATORY_CARE_PROVIDER_SITE_OTHER): Payer: Medicaid Other | Admitting: Obstetrics and Gynecology

## 2022-09-18 ENCOUNTER — Other Ambulatory Visit (HOSPITAL_COMMUNITY)
Admission: RE | Admit: 2022-09-18 | Discharge: 2022-09-18 | Disposition: A | Discharge status: Home / Self Care | Payer: Medicaid Other | Source: Ambulatory Visit | Attending: Obstetrics and Gynecology | Admitting: Obstetrics and Gynecology

## 2022-09-18 ENCOUNTER — Encounter: Payer: Self-pay | Admitting: Obstetrics and Gynecology

## 2022-09-18 VITALS — BP 115/75 | HR 116 | Wt 180.0 lb

## 2022-09-18 DIAGNOSIS — O09291 Supervision of pregnancy with other poor reproductive or obstetric history, first trimester: Secondary | ICD-10-CM

## 2022-09-18 DIAGNOSIS — Z3A12 12 weeks gestation of pregnancy: Secondary | ICD-10-CM

## 2022-09-18 DIAGNOSIS — O0990 Supervision of high risk pregnancy, unspecified, unspecified trimester: Secondary | ICD-10-CM | POA: Diagnosis present

## 2022-09-18 DIAGNOSIS — F319 Bipolar disorder, unspecified: Secondary | ICD-10-CM

## 2022-09-18 DIAGNOSIS — J45909 Unspecified asthma, uncomplicated: Secondary | ICD-10-CM

## 2022-09-18 DIAGNOSIS — O0991 Supervision of high risk pregnancy, unspecified, first trimester: Secondary | ICD-10-CM | POA: Diagnosis not present

## 2022-09-18 DIAGNOSIS — Z8759 Personal history of other complications of pregnancy, childbirth and the puerperium: Secondary | ICD-10-CM | POA: Insufficient documentation

## 2022-09-18 DIAGNOSIS — N870 Mild cervical dysplasia: Secondary | ICD-10-CM

## 2022-09-18 DIAGNOSIS — Z5941 Food insecurity: Secondary | ICD-10-CM | POA: Diagnosis not present

## 2022-09-18 DIAGNOSIS — O99341 Other mental disorders complicating pregnancy, first trimester: Secondary | ICD-10-CM

## 2022-09-18 DIAGNOSIS — O99511 Diseases of the respiratory system complicating pregnancy, first trimester: Secondary | ICD-10-CM

## 2022-09-18 DIAGNOSIS — J4530 Mild persistent asthma, uncomplicated: Secondary | ICD-10-CM

## 2022-09-18 MED ORDER — VITAFOL GUMMIES 3.33-0.333-34.8 MG PO CHEW
1.0000 | CHEWABLE_TABLET | Freq: Every day | ORAL | 3 refills | Status: AC
Start: 2022-09-18 — End: ?

## 2022-09-18 MED ORDER — VITAMIN D 50 MCG (2000 UT) PO TABS: 2000.0000 [IU] | ORAL_TABLET | Freq: Every day | ORAL | Status: DC

## 2022-09-18 MED ORDER — ASPIRIN 81 MG PO TBEC
81.0000 mg | DELAYED_RELEASE_TABLET | Freq: Every day | ORAL | 3 refills | Status: DC
Start: 2022-09-18 — End: 2023-03-21
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
## 2022-09-19 LAB — CERVICOVAGINAL ANCILLARY ONLY
Chlamydia: NEGATIVE
Comment: NEGATIVE
Comment: NEGATIVE
Comment: NORMAL
Neisseria Gonorrhea: NEGATIVE
Trichomonas: NEGATIVE

## 2022-09-19 LAB — CBC/D/PLT+RPR+RH+ABO+RUBIGG...
Antibody Screen: NEGATIVE
Basophils Absolute: 0 10*3/uL (ref 0.0–0.2)
Basos: 0 %
EOS (ABSOLUTE): 0.1 10*3/uL (ref 0.0–0.4)
Eos: 1 %
HCV Ab: NONREACTIVE
HIV Screen 4th Generation wRfx: NONREACTIVE
Hematocrit: 38.6 % (ref 34.0–46.6)
Hemoglobin: 13.3 g/dL (ref 11.1–15.9)
Hepatitis B Surface Ag: NEGATIVE
Immature Grans (Abs): 0.1 10*3/uL (ref 0.0–0.1)
Immature Granulocytes: 1 %
Lymphocytes Absolute: 3.1 10*3/uL (ref 0.7–3.1)
Lymphs: 28 %
MCH: 30.7 pg (ref 26.6–33.0)
MCHC: 34.5 g/dL (ref 31.5–35.7)
MCV: 89 fL (ref 79–97)
Monocytes Absolute: 0.7 10*3/uL (ref 0.1–0.9)
Monocytes: 6 %
Neutrophils Absolute: 7.1 10*3/uL — ABNORMAL HIGH (ref 1.4–7.0)
Neutrophils: 64 %
Platelets: 197 10*3/uL (ref 150–450)
RBC: 4.33 x10E6/uL (ref 3.77–5.28)
RDW: 12.9 % (ref 11.7–15.4)
RPR Ser Ql: NONREACTIVE
Rh Factor: POSITIVE
Rubella Antibodies, IGG: 3.89 index (ref >0.99)
WBC: 11 10*3/uL — ABNORMAL HIGH (ref 3.4–10.8)

## 2022-09-19 LAB — HCV INTERPRETATION

## 2022-09-20 LAB — CULTURE, OB URINE

## 2022-09-20 LAB — URINE CULTURE, OB REFLEX: Organism ID, Bacteria: NO GROWTH

## 2022-09-26 ENCOUNTER — Encounter: Payer: Self-pay | Admitting: General Practice

## 2022-09-27 LAB — PANORAMA PRENATAL TEST FULL PANEL:PANORAMA TEST PLUS 5 ADDITIONAL MICRODELETIONS: FETAL FRACTION: 9

## 2022-10-16 ENCOUNTER — Other Ambulatory Visit: Payer: Self-pay

## 2022-10-16 ENCOUNTER — Encounter: Payer: Self-pay | Admitting: Obstetrics and Gynecology

## 2022-10-16 ENCOUNTER — Other Ambulatory Visit (HOSPITAL_COMMUNITY)
Admission: RE | Admit: 2022-10-16 | Discharge: 2022-10-16 | Disposition: A | Discharge status: Home / Self Care | Payer: Medicaid Other | Source: Ambulatory Visit | Attending: Obstetrics and Gynecology | Admitting: Obstetrics and Gynecology

## 2022-10-16 ENCOUNTER — Ambulatory Visit (INDEPENDENT_AMBULATORY_CARE_PROVIDER_SITE_OTHER): Payer: Medicaid Other | Admitting: Obstetrics and Gynecology

## 2022-10-16 VITALS — BP 100/77 | HR 94 | Wt 186.4 lb

## 2022-10-16 DIAGNOSIS — O0992 Supervision of high risk pregnancy, unspecified, second trimester: Secondary | ICD-10-CM

## 2022-10-16 DIAGNOSIS — N898 Other specified noninflammatory disorders of vagina: Secondary | ICD-10-CM | POA: Insufficient documentation

## 2022-10-16 DIAGNOSIS — N870 Mild cervical dysplasia: Secondary | ICD-10-CM

## 2022-10-16 DIAGNOSIS — O09291 Supervision of pregnancy with other poor reproductive or obstetric history, first trimester: Secondary | ICD-10-CM

## 2022-10-16 DIAGNOSIS — O0990 Supervision of high risk pregnancy, unspecified, unspecified trimester: Secondary | ICD-10-CM

## 2022-10-16 DIAGNOSIS — Z3A16 16 weeks gestation of pregnancy: Secondary | ICD-10-CM
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
## 2022-10-17 LAB — CERVICOVAGINAL ANCILLARY ONLY
Bacterial Vaginitis (gardnerella): POSITIVE — AB
Candida Glabrata: NEGATIVE
Candida Vaginitis: NEGATIVE
Comment: NEGATIVE
Comment: NEGATIVE
Comment: NEGATIVE

## 2022-10-18 ENCOUNTER — Encounter: Payer: Self-pay | Admitting: Obstetrics and Gynecology

## 2022-10-18 ENCOUNTER — Other Ambulatory Visit: Payer: Self-pay | Admitting: Lactation Services

## 2022-10-18 MED ORDER — METRONIDAZOLE 500 MG PO TABS: 500.0000 mg | ORAL_TABLET | Freq: Two times a day (BID) | ORAL | 0 refills | Status: DC

## 2022-11-06 ENCOUNTER — Other Ambulatory Visit: Payer: Self-pay | Admitting: *Deleted

## 2022-11-06 ENCOUNTER — Encounter: Payer: Self-pay | Admitting: *Deleted

## 2022-11-06 ENCOUNTER — Ambulatory Visit: Payer: Medicaid Other | Admitting: *Deleted

## 2022-11-06 ENCOUNTER — Ambulatory Visit: Payer: Medicaid Other | Attending: Obstetrics and Gynecology

## 2022-11-06 VITALS — BP 116/61 | HR 88

## 2022-11-06 DIAGNOSIS — O2622 Pregnancy care for patient with recurrent pregnancy loss, second trimester: Secondary | ICD-10-CM

## 2022-11-06 DIAGNOSIS — O0990 Supervision of high risk pregnancy, unspecified, unspecified trimester: Secondary | ICD-10-CM

## 2022-11-06 DIAGNOSIS — Z3A19 19 weeks gestation of pregnancy: Secondary | ICD-10-CM | POA: Diagnosis not present

## 2022-11-06 DIAGNOSIS — O09299 Supervision of pregnancy with other poor reproductive or obstetric history, unspecified trimester: Secondary | ICD-10-CM

## 2022-11-06 DIAGNOSIS — O09292 Supervision of pregnancy with other poor reproductive or obstetric history, second trimester: Secondary | ICD-10-CM | POA: Diagnosis not present

## 2022-11-11 ENCOUNTER — Encounter: Payer: Self-pay | Admitting: Certified Nurse Midwife
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
## 2022-11-14 ENCOUNTER — Ambulatory Visit: Payer: Medicaid Other | Admitting: Certified Nurse Midwife

## 2022-11-14 ENCOUNTER — Other Ambulatory Visit: Payer: Self-pay

## 2022-11-14 VITALS — BP 99/66 | HR 89 | Wt 195.3 lb

## 2022-11-14 DIAGNOSIS — Z3A20 20 weeks gestation of pregnancy: Secondary | ICD-10-CM

## 2022-11-14 DIAGNOSIS — B372 Candidiasis of skin and nail: Secondary | ICD-10-CM

## 2022-11-14 DIAGNOSIS — O0992 Supervision of high risk pregnancy, unspecified, second trimester: Secondary | ICD-10-CM

## 2022-11-14 DIAGNOSIS — O09291 Supervision of pregnancy with other poor reproductive or obstetric history, first trimester: Secondary | ICD-10-CM

## 2022-11-14 DIAGNOSIS — O1202 Gestational edema, second trimester: Secondary | ICD-10-CM

## 2022-11-14 MED ORDER — CLOTRIMAZOLE 1 % EX CREA: 1.0000 | TOPICAL_CREAM | Freq: Two times a day (BID) | CUTANEOUS | 0 refills | Status: DC

## 2022-11-24 ENCOUNTER — Encounter: Payer: Self-pay | Admitting: Certified Nurse Midwife

## 2022-12-12 ENCOUNTER — Other Ambulatory Visit: Payer: Self-pay | Admitting: *Deleted

## 2022-12-12 ENCOUNTER — Ambulatory Visit: Payer: Medicaid Other | Admitting: *Deleted

## 2022-12-12 ENCOUNTER — Ambulatory Visit: Payer: Medicaid Other | Attending: Obstetrics

## 2022-12-12 VITALS — BP 124/63 | HR 80

## 2022-12-12 DIAGNOSIS — O0990 Supervision of high risk pregnancy, unspecified, unspecified trimester: Secondary | ICD-10-CM | POA: Diagnosis present

## 2022-12-12 DIAGNOSIS — Z3A24 24 weeks gestation of pregnancy: Secondary | ICD-10-CM | POA: Diagnosis not present

## 2022-12-12 DIAGNOSIS — O09292 Supervision of pregnancy with other poor reproductive or obstetric history, second trimester: Secondary | ICD-10-CM

## 2022-12-12 DIAGNOSIS — O09299 Supervision of pregnancy with other poor reproductive or obstetric history, unspecified trimester: Secondary | ICD-10-CM

## 2022-12-12 DIAGNOSIS — O2622 Pregnancy care for patient with recurrent pregnancy loss, second trimester: Secondary | ICD-10-CM
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
## 2022-12-13 ENCOUNTER — Other Ambulatory Visit: Payer: Self-pay

## 2022-12-13 ENCOUNTER — Other Ambulatory Visit (HOSPITAL_COMMUNITY)
Admission: RE | Admit: 2022-12-13 | Discharge: 2022-12-13 | Disposition: A | Discharge status: Home / Self Care | Payer: Medicaid Other | Source: Ambulatory Visit | Attending: Certified Nurse Midwife | Admitting: Certified Nurse Midwife

## 2022-12-13 ENCOUNTER — Ambulatory Visit (INDEPENDENT_AMBULATORY_CARE_PROVIDER_SITE_OTHER): Payer: Medicaid Other | Admitting: Certified Nurse Midwife

## 2022-12-13 VITALS — BP 116/71 | HR 92 | Wt 205.1 lb

## 2022-12-13 DIAGNOSIS — Z113 Encounter for screening for infections with a predominantly sexual mode of transmission: Secondary | ICD-10-CM | POA: Diagnosis present

## 2022-12-13 DIAGNOSIS — Z3A24 24 weeks gestation of pregnancy: Secondary | ICD-10-CM

## 2022-12-13 DIAGNOSIS — O0990 Supervision of high risk pregnancy, unspecified, unspecified trimester: Secondary | ICD-10-CM

## 2022-12-13 DIAGNOSIS — N898 Other specified noninflammatory disorders of vagina: Secondary | ICD-10-CM

## 2022-12-13 DIAGNOSIS — O0992 Supervision of high risk pregnancy, unspecified, second trimester: Secondary | ICD-10-CM

## 2022-12-15 LAB — CERVICOVAGINAL ANCILLARY ONLY
Bacterial Vaginitis (gardnerella): NEGATIVE
Candida Glabrata: NEGATIVE
Candida Vaginitis: NEGATIVE
Chlamydia: NEGATIVE
Comment: NEGATIVE
Comment: NEGATIVE
Comment: NEGATIVE
Comment: NEGATIVE
Comment: NEGATIVE
Comment: NORMAL
Neisseria Gonorrhea: NEGATIVE
Trichomonas: NEGATIVE

## 2022-12-16 ENCOUNTER — Encounter: Payer: Self-pay | Admitting: Certified Nurse Midwife

## 2022-12-25 ENCOUNTER — Other Ambulatory Visit: Payer: Self-pay

## 2022-12-25 DIAGNOSIS — O0990 Supervision of high risk pregnancy, unspecified, unspecified trimester: Secondary | ICD-10-CM
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
## 2022-12-27 ENCOUNTER — Ambulatory Visit (INDEPENDENT_AMBULATORY_CARE_PROVIDER_SITE_OTHER): Payer: Medicaid Other | Admitting: Certified Nurse Midwife

## 2022-12-27 ENCOUNTER — Other Ambulatory Visit: Payer: Medicaid Other

## 2022-12-27 ENCOUNTER — Encounter: Payer: Self-pay | Admitting: Certified Nurse Midwife

## 2022-12-27 VITALS — BP 120/74 | HR 99 | Wt 204.4 lb

## 2022-12-27 DIAGNOSIS — O0992 Supervision of high risk pregnancy, unspecified, second trimester: Secondary | ICD-10-CM

## 2022-12-27 DIAGNOSIS — Z23 Encounter for immunization: Secondary | ICD-10-CM

## 2022-12-27 DIAGNOSIS — O09291 Supervision of pregnancy with other poor reproductive or obstetric history, first trimester: Secondary | ICD-10-CM

## 2022-12-27 DIAGNOSIS — Z3A26 26 weeks gestation of pregnancy: Secondary | ICD-10-CM

## 2022-12-27 DIAGNOSIS — O0990 Supervision of high risk pregnancy, unspecified, unspecified trimester: Secondary | ICD-10-CM

## 2022-12-27 DIAGNOSIS — O09292 Supervision of pregnancy with other poor reproductive or obstetric history, second trimester: Secondary | ICD-10-CM

## 2022-12-28 LAB — CBC
Hematocrit: 37.5 % (ref 34.0–46.6)
Hemoglobin: 12.4 g/dL (ref 11.1–15.9)
MCH: 30.9 pg (ref 26.6–33.0)
MCHC: 33.1 g/dL (ref 31.5–35.7)
MCV: 94 fL (ref 79–97)
Platelets: 186 10*3/uL (ref 150–450)
RBC: 4.01 x10E6/uL (ref 3.77–5.28)
RDW: 13 % (ref 11.7–15.4)
WBC: 10.8 10*3/uL (ref 3.4–10.8)

## 2022-12-28 LAB — RPR: RPR Ser Ql: NONREACTIVE

## 2022-12-28 LAB — GLUCOSE TOLERANCE, 2 HOURS W/ 1HR
Glucose, 1 hour: 151 mg/dL (ref 70–179)
Glucose, 2 hour: 123 mg/dL (ref 70–152)
Glucose, Fasting: 76 mg/dL (ref 70–91)

## 2022-12-28 LAB — HIV ANTIBODY (ROUTINE TESTING W REFLEX): HIV Screen 4th Generation wRfx: NONREACTIVE
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
## 2023-01-10 ENCOUNTER — Other Ambulatory Visit (HOSPITAL_COMMUNITY)
Admission: RE | Admit: 2023-01-10 | Discharge: 2023-01-10 | Disposition: A | Discharge status: Home / Self Care | Payer: Medicaid Other | Source: Ambulatory Visit | Attending: Certified Nurse Midwife | Admitting: Certified Nurse Midwife

## 2023-01-10 ENCOUNTER — Ambulatory Visit (INDEPENDENT_AMBULATORY_CARE_PROVIDER_SITE_OTHER): Payer: Medicaid Other | Admitting: Certified Nurse Midwife

## 2023-01-10 ENCOUNTER — Ambulatory Visit: Payer: Medicaid Other | Attending: Maternal & Fetal Medicine

## 2023-01-10 ENCOUNTER — Other Ambulatory Visit: Payer: Self-pay

## 2023-01-10 VITALS — BP 109/72 | HR 102 | Wt 210.1 lb

## 2023-01-10 DIAGNOSIS — N898 Other specified noninflammatory disorders of vagina: Secondary | ICD-10-CM

## 2023-01-10 DIAGNOSIS — O0993 Supervision of high risk pregnancy, unspecified, third trimester: Secondary | ICD-10-CM

## 2023-01-10 DIAGNOSIS — O09293 Supervision of pregnancy with other poor reproductive or obstetric history, third trimester: Secondary | ICD-10-CM | POA: Diagnosis not present

## 2023-01-10 DIAGNOSIS — O09299 Supervision of pregnancy with other poor reproductive or obstetric history, unspecified trimester: Secondary | ICD-10-CM | POA: Diagnosis present

## 2023-01-10 DIAGNOSIS — O2623 Pregnancy care for patient with recurrent pregnancy loss, third trimester: Secondary | ICD-10-CM | POA: Diagnosis not present

## 2023-01-10 DIAGNOSIS — Z3A28 28 weeks gestation of pregnancy: Secondary | ICD-10-CM

## 2023-01-10 DIAGNOSIS — Z8759 Personal history of other complications of pregnancy, childbirth and the puerperium: Secondary | ICD-10-CM

## 2023-01-11 LAB — CERVICOVAGINAL ANCILLARY ONLY
Bacterial Vaginitis (gardnerella): NEGATIVE
Candida Glabrata: NEGATIVE
Candida Vaginitis: NEGATIVE
Comment: NEGATIVE
Comment: NEGATIVE
Comment: NEGATIVE
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
## 2023-01-24 ENCOUNTER — Ambulatory Visit (INDEPENDENT_AMBULATORY_CARE_PROVIDER_SITE_OTHER): Payer: Medicaid Other | Admitting: Certified Nurse Midwife

## 2023-01-24 ENCOUNTER — Other Ambulatory Visit: Payer: Self-pay

## 2023-01-24 VITALS — BP 108/71 | HR 93 | Wt 212.4 lb

## 2023-01-24 DIAGNOSIS — O0993 Supervision of high risk pregnancy, unspecified, third trimester: Secondary | ICD-10-CM

## 2023-01-24 DIAGNOSIS — Z3A34 34 weeks gestation of pregnancy: Secondary | ICD-10-CM
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
## 2023-02-07 ENCOUNTER — Ambulatory Visit (INDEPENDENT_AMBULATORY_CARE_PROVIDER_SITE_OTHER): Payer: Medicaid Other | Admitting: Certified Nurse Midwife

## 2023-02-07 VITALS — BP 119/77 | HR 84 | Wt 221.0 lb

## 2023-02-07 DIAGNOSIS — Z3A32 32 weeks gestation of pregnancy: Secondary | ICD-10-CM

## 2023-02-07 DIAGNOSIS — Z0289 Encounter for other administrative examinations: Secondary | ICD-10-CM

## 2023-02-07 DIAGNOSIS — O0993 Supervision of high risk pregnancy, unspecified, third trimester: Secondary | ICD-10-CM
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
## 2023-02-21 ENCOUNTER — Other Ambulatory Visit: Payer: Self-pay

## 2023-02-21 ENCOUNTER — Ambulatory Visit (INDEPENDENT_AMBULATORY_CARE_PROVIDER_SITE_OTHER): Payer: Medicaid Other | Admitting: Certified Nurse Midwife

## 2023-02-21 VITALS — BP 115/73 | HR 92 | Wt 225.0 lb

## 2023-02-21 DIAGNOSIS — Z8759 Personal history of other complications of pregnancy, childbirth and the puerperium: Secondary | ICD-10-CM

## 2023-02-21 DIAGNOSIS — O0993 Supervision of high risk pregnancy, unspecified, third trimester: Secondary | ICD-10-CM

## 2023-02-21 DIAGNOSIS — Z3A34 34 weeks gestation of pregnancy: Secondary | ICD-10-CM

## 2023-02-24 ENCOUNTER — Encounter: Payer: Self-pay | Admitting: Certified Nurse Midwife
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
## 2023-03-07 ENCOUNTER — Other Ambulatory Visit (HOSPITAL_COMMUNITY)
Admission: RE | Admit: 2023-03-07 | Discharge: 2023-03-07 | Disposition: A | Discharge status: Home / Self Care | Payer: Medicaid Other | Source: Ambulatory Visit | Attending: Certified Nurse Midwife | Admitting: Certified Nurse Midwife

## 2023-03-07 ENCOUNTER — Ambulatory Visit (INDEPENDENT_AMBULATORY_CARE_PROVIDER_SITE_OTHER): Payer: Medicaid Other | Admitting: Certified Nurse Midwife

## 2023-03-07 ENCOUNTER — Other Ambulatory Visit: Payer: Self-pay

## 2023-03-07 VITALS — BP 108/66 | HR 101 | Wt 225.0 lb

## 2023-03-07 DIAGNOSIS — O0993 Supervision of high risk pregnancy, unspecified, third trimester: Secondary | ICD-10-CM | POA: Insufficient documentation

## 2023-03-07 DIAGNOSIS — Z8759 Personal history of other complications of pregnancy, childbirth and the puerperium: Secondary | ICD-10-CM

## 2023-03-07 DIAGNOSIS — O09893 Supervision of other high risk pregnancies, third trimester: Secondary | ICD-10-CM

## 2023-03-07 DIAGNOSIS — Z3A36 36 weeks gestation of pregnancy: Secondary | ICD-10-CM | POA: Diagnosis present

## 2023-03-08 LAB — GC/CHLAMYDIA PROBE AMP (~~LOC~~) NOT AT ARMC
Chlamydia: NEGATIVE
Comment: NEGATIVE
Comment: NORMAL
Neisseria Gonorrhea: NEGATIVE

## 2023-03-09 ENCOUNTER — Encounter: Payer: Self-pay | Admitting: Certified Nurse Midwife

## 2023-03-11 LAB — CULTURE, BETA STREP (GROUP B ONLY): Strep Gp B Culture: NEGATIVE

## 2023-03-14 ENCOUNTER — Inpatient Hospital Stay (HOSPITAL_COMMUNITY): Payer: Medicaid Other

## 2023-03-14 ENCOUNTER — Encounter: Payer: Self-pay | Admitting: Certified Nurse Midwife

## 2023-03-14 ENCOUNTER — Ambulatory Visit (INDEPENDENT_AMBULATORY_CARE_PROVIDER_SITE_OTHER): Payer: Medicaid Other | Admitting: Certified Nurse Midwife

## 2023-03-14 ENCOUNTER — Encounter (HOSPITAL_COMMUNITY): Payer: Self-pay | Admitting: Obstetrics & Gynecology

## 2023-03-14 ENCOUNTER — Inpatient Hospital Stay (HOSPITAL_COMMUNITY)
Admission: AD | Admit: 2023-03-14 | Discharge: 2023-03-14 | Disposition: A | Discharge status: Home / Self Care | Payer: Medicaid Other | Attending: Obstetrics & Gynecology | Admitting: Obstetrics & Gynecology

## 2023-03-14 VITALS — BP 119/76 | HR 97 | Wt 227.5 lb

## 2023-03-14 DIAGNOSIS — Z3A37 37 weeks gestation of pregnancy: Secondary | ICD-10-CM

## 2023-03-14 DIAGNOSIS — O99343 Other mental disorders complicating pregnancy, third trimester: Secondary | ICD-10-CM | POA: Diagnosis not present

## 2023-03-14 DIAGNOSIS — O09293 Supervision of pregnancy with other poor reproductive or obstetric history, third trimester: Secondary | ICD-10-CM

## 2023-03-14 DIAGNOSIS — O368330 Maternal care for abnormalities of the fetal heart rate or rhythm, third trimester, not applicable or unspecified: Secondary | ICD-10-CM | POA: Diagnosis not present

## 2023-03-14 DIAGNOSIS — O368130 Decreased fetal movements, third trimester, not applicable or unspecified: Secondary | ICD-10-CM | POA: Diagnosis not present

## 2023-03-14 DIAGNOSIS — O0993 Supervision of high risk pregnancy, unspecified, third trimester: Secondary | ICD-10-CM | POA: Diagnosis not present

## 2023-03-14 DIAGNOSIS — Z8759 Personal history of other complications of pregnancy, childbirth and the puerperium: Secondary | ICD-10-CM | POA: Diagnosis not present

## 2023-03-14 DIAGNOSIS — O368390 Maternal care for abnormalities of the fetal heart rate or rhythm, unspecified trimester, not applicable or unspecified: Secondary | ICD-10-CM | POA: Diagnosis not present

## 2023-03-14 DIAGNOSIS — O99513 Diseases of the respiratory system complicating pregnancy, third trimester: Secondary | ICD-10-CM | POA: Diagnosis not present

## 2023-03-14 DIAGNOSIS — O26893 Other specified pregnancy related conditions, third trimester: Secondary | ICD-10-CM | POA: Insufficient documentation

## 2023-03-14 DIAGNOSIS — O2623 Pregnancy care for patient with recurrent pregnancy loss, third trimester: Secondary | ICD-10-CM | POA: Diagnosis not present

## 2023-03-14 DIAGNOSIS — Z3689 Encounter for other specified antenatal screening: Secondary | ICD-10-CM
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
## 2023-03-14 NOTE — MAU Note (Signed)
Connie Cabrera is a 32 y.o. at [redacted]w[redacted]d here in MAU reporting: was hooked up to the monitor, every time she had a contraction, the baby's heart beat went down.  Cx closed. No blood, no leaking. Reports loose stools. Onset of complaint: this morning Pain score: moderate, 'high pain tolerance" Vitals:   03/14/23 1100  BP: 123/73  Pulse: 100  Resp: 18  Temp: 97.9 F (36.6 C)  SpO2: 99%     FHT:148 Lab orders placed from triage:

## 2023-03-16 ENCOUNTER — Other Ambulatory Visit: Payer: Self-pay

## 2023-03-16 ENCOUNTER — Ambulatory Visit: Payer: Medicaid Other | Admitting: *Deleted

## 2023-03-16 VITALS — BP 108/69 | HR 100 | Wt 227.3 lb

## 2023-03-16 DIAGNOSIS — O09291 Supervision of pregnancy with other poor reproductive or obstetric history, first trimester: Secondary | ICD-10-CM

## 2023-03-16 DIAGNOSIS — O368130 Decreased fetal movements, third trimester, not applicable or unspecified: Secondary | ICD-10-CM | POA: Diagnosis not present

## 2023-03-16 DIAGNOSIS — Z3A35 35 weeks gestation of pregnancy: Secondary | ICD-10-CM | POA: Diagnosis not present

## 2023-03-16 DIAGNOSIS — O0990 Supervision of high risk pregnancy, unspecified, unspecified trimester: Secondary | ICD-10-CM

## 2023-03-16 NOTE — Progress Notes (Addendum)
Here for scheduled NST. States did not feel baby movement much throught the night but has felt a lot since she got here today. Dr. Nobie Putnam in to review NST. He advises is reactive and advised patient if she is not feeling baby movement 5 times per hour or 10 times per 2 hours to go to MAU for evaluation. Patient voices understanding.  Nancy Fetter  Chart reviewed for nurse visit. Agree with plan of care.   Celedonio Savage, MD 03/16/2023 10:24 AM

## 2023-03-19 ENCOUNTER — Encounter: Payer: Self-pay | Admitting: Certified Nurse Midwife

## 2023-03-20 ENCOUNTER — Encounter (HOSPITAL_COMMUNITY)
Admission: AD | Disposition: A | Discharge status: Home / Self Care | Payer: Self-pay | Source: Home / Self Care | Attending: Obstetrics and Gynecology

## 2023-03-20 ENCOUNTER — Inpatient Hospital Stay (HOSPITAL_COMMUNITY): Payer: Medicaid Other | Admitting: Anesthesiology

## 2023-03-20 ENCOUNTER — Other Ambulatory Visit: Payer: Self-pay

## 2023-03-20 ENCOUNTER — Inpatient Hospital Stay (HOSPITAL_COMMUNITY)
Admission: AD | Admit: 2023-03-20 | Discharge: 2023-03-21 | DRG: 798 | Disposition: A | Discharge status: Home / Self Care | Payer: Medicaid Other | Attending: Obstetrics and Gynecology | Admitting: Obstetrics and Gynecology

## 2023-03-20 ENCOUNTER — Encounter (HOSPITAL_COMMUNITY): Payer: Self-pay | Admitting: Obstetrics and Gynecology

## 2023-03-20 DIAGNOSIS — Z833 Family history of diabetes mellitus: Secondary | ICD-10-CM

## 2023-03-20 DIAGNOSIS — Z87891 Personal history of nicotine dependence: Secondary | ICD-10-CM

## 2023-03-20 DIAGNOSIS — Z87410 Personal history of cervical dysplasia: Secondary | ICD-10-CM | POA: Diagnosis not present

## 2023-03-20 DIAGNOSIS — F319 Bipolar disorder, unspecified: Secondary | ICD-10-CM | POA: Diagnosis present

## 2023-03-20 DIAGNOSIS — O26893 Other specified pregnancy related conditions, third trimester: Secondary | ICD-10-CM | POA: Diagnosis present

## 2023-03-20 DIAGNOSIS — Z302 Encounter for sterilization: Secondary | ICD-10-CM | POA: Diagnosis not present

## 2023-03-20 DIAGNOSIS — Z3A38 38 weeks gestation of pregnancy: Secondary | ICD-10-CM

## 2023-03-20 DIAGNOSIS — Z8249 Family history of ischemic heart disease and other diseases of the circulatory system: Secondary | ICD-10-CM

## 2023-03-20 DIAGNOSIS — O0990 Supervision of high risk pregnancy, unspecified, unspecified trimester: Secondary | ICD-10-CM

## 2023-03-20 DIAGNOSIS — Z62810 Personal history of physical and sexual abuse in childhood: Secondary | ICD-10-CM

## 2023-03-20 DIAGNOSIS — Z8759 Personal history of other complications of pregnancy, childbirth and the puerperium: Secondary | ICD-10-CM

## 2023-03-20 DIAGNOSIS — N870 Mild cervical dysplasia: Secondary | ICD-10-CM | POA: Diagnosis present

## 2023-03-20 DIAGNOSIS — O99344 Other mental disorders complicating childbirth: Secondary | ICD-10-CM | POA: Diagnosis not present

## 2023-03-20 HISTORY — PX: TUBAL LIGATION: SHX77

## 2023-03-20 LAB — CBC
HCT: 38.7 % (ref 36.0–46.0)
Hemoglobin: 12.6 g/dL (ref 12.0–15.0)
MCH: 28.9 pg (ref 26.0–34.0)
MCHC: 32.6 g/dL (ref 30.0–36.0)
MCV: 88.8 fL (ref 80.0–100.0)
Platelets: 190 10*3/uL (ref 150–400)
RBC: 4.36 MIL/uL (ref 3.87–5.11)
RDW: 14.6 % (ref 11.5–15.5)
WBC: 10.5 10*3/uL (ref 4.0–10.5)
nRBC: 0 % (ref 0.0–0.2)

## 2023-03-20 LAB — TYPE AND SCREEN
ABO/RH(D): A POS
Antibody Screen: NEGATIVE

## 2023-03-20 LAB — RPR: RPR Ser Ql: NONREACTIVE

## 2023-03-20 SURGERY — LIGATION, FALLOPIAN TUBE, POSTPARTUM
Anesthesia: Epidural | Laterality: Bilateral

## 2023-03-20 MED ORDER — ONDANSETRON HCL 4 MG/2ML IJ SOLN
4.0000 mg | Freq: Four times a day (QID) | INTRAMUSCULAR | Status: DC | PRN
  Administered 2023-03-20: 4 mg via INTRAVENOUS
  Filled 2023-03-20: qty 2

## 2023-03-20 MED ORDER — IBUPROFEN 800 MG PO TABS
800.0000 mg | ORAL_TABLET | Freq: Three times a day (TID) | ORAL | Status: DC
  Administered 2023-03-20 – 2023-03-21 (×3): 800 mg via ORAL
  Filled 2023-03-20 (×3): qty 1

## 2023-03-20 MED ORDER — ZOLPIDEM TARTRATE 5 MG PO TABS
5.0000 mg | ORAL_TABLET | Freq: Every evening | ORAL | Status: DC | PRN
Start: 2023-03-20 — End: 2023-03-21

## 2023-03-20 MED ORDER — ONDANSETRON HCL 4 MG/2ML IJ SOLN: 4.0000 mg | Freq: Once | INTRAMUSCULAR | Status: DC | PRN

## 2023-03-20 MED ORDER — LIDOCAINE-EPINEPHRINE (PF) 2 %-1:200000 IJ SOLN
INTRAMUSCULAR | Status: DC | PRN
  Administered 2023-03-20: 10 mL via EPIDURAL

## 2023-03-20 MED ORDER — OXYTOCIN BOLUS FROM INFUSION
333.0000 mL | Freq: Once | INTRAVENOUS | Status: AC
  Administered 2023-03-20: 333 mL via INTRAVENOUS

## 2023-03-20 MED ORDER — BUPIVACAINE HCL (PF) 0.25 % IJ SOLN
INTRAMUSCULAR | Status: AC
  Filled 2023-03-20: qty 30

## 2023-03-20 MED ORDER — LIDOCAINE HCL (PF) 1 % IJ SOLN
INTRAMUSCULAR | Status: DC | PRN
  Administered 2023-03-20 (×2): 4 mL via EPIDURAL

## 2023-03-20 MED ORDER — ONDANSETRON HCL 4 MG/2ML IJ SOLN
INTRAMUSCULAR | Status: DC | PRN
  Administered 2023-03-20: 4 mg via INTRAVENOUS

## 2023-03-20 MED ORDER — BENZOCAINE-MENTHOL 20-0.5 % EX AERO
1.0000 | INHALATION_SPRAY | CUTANEOUS | Status: DC | PRN
  Administered 2023-03-20: 1 via TOPICAL
  Filled 2023-03-20: qty 56

## 2023-03-20 MED ORDER — OXYCODONE-ACETAMINOPHEN 5-325 MG PO TABS: 2.0000 | ORAL_TABLET | ORAL | Status: DC | PRN

## 2023-03-20 MED ORDER — LIDOCAINE HCL (PF) 1 % IJ SOLN: 30.0000 mL | INTRAMUSCULAR | Status: DC | PRN

## 2023-03-20 MED ORDER — BUPIVACAINE HCL (PF) 0.25 % IJ SOLN
INTRAMUSCULAR | Status: DC | PRN
  Administered 2023-03-20: 10 mL

## 2023-03-20 MED ORDER — MIDAZOLAM HCL 2 MG/2ML IJ SOLN
INTRAMUSCULAR | Status: DC | PRN
  Administered 2023-03-20 (×2): 1 mg via INTRAVENOUS

## 2023-03-20 MED ORDER — KETOROLAC TROMETHAMINE 30 MG/ML IJ SOLN
INTRAMUSCULAR | Status: AC
  Filled 2023-03-20: qty 1

## 2023-03-20 MED ORDER — KETOROLAC TROMETHAMINE 30 MG/ML IJ SOLN: 30.0000 mg | Freq: Once | INTRAMUSCULAR | Status: DC | PRN

## 2023-03-20 MED ORDER — STERILE WATER FOR IRRIGATION IR SOLN
Status: DC | PRN
  Administered 2023-03-20: 1

## 2023-03-20 MED ORDER — HYDROXYZINE HCL 50 MG PO TABS: 50.0000 mg | ORAL_TABLET | Freq: Four times a day (QID) | ORAL | Status: DC | PRN

## 2023-03-20 MED ORDER — FLEET ENEMA RE ENEM: 1.0000 | ENEMA | RECTAL | Status: DC | PRN

## 2023-03-20 MED ORDER — SENNOSIDES-DOCUSATE SODIUM 8.6-50 MG PO TABS
2.0000 | ORAL_TABLET | Freq: Every day | ORAL | Status: DC
  Administered 2023-03-21: 2 via ORAL
  Filled 2023-03-20: qty 2

## 2023-03-20 MED ORDER — WITCH HAZEL-GLYCERIN EX PADS: 1.0000 | MEDICATED_PAD | CUTANEOUS | Status: DC | PRN

## 2023-03-20 MED ORDER — FENTANYL-BUPIVACAINE-NACL 0.5-0.125-0.9 MG/250ML-% EP SOLN
EPIDURAL | Status: AC
  Filled 2023-03-20: qty 250

## 2023-03-20 MED ORDER — ONDANSETRON HCL 4 MG/2ML IJ SOLN: 4.0000 mg | INTRAMUSCULAR | Status: DC | PRN

## 2023-03-20 MED ORDER — OXYCODONE HCL 5 MG PO TABS
5.0000 mg | ORAL_TABLET | Freq: Four times a day (QID) | ORAL | Status: DC | PRN
  Administered 2023-03-21: 5 mg via ORAL
  Filled 2023-03-20: qty 1

## 2023-03-20 MED ORDER — ACETAMINOPHEN 500 MG PO TABS
1000.0000 mg | ORAL_TABLET | Freq: Three times a day (TID) | ORAL | Status: DC
  Administered 2023-03-20 – 2023-03-21 (×3): 1000 mg via ORAL
  Filled 2023-03-20 (×3): qty 2

## 2023-03-20 MED ORDER — PHENYLEPHRINE 80 MCG/ML (10ML) SYRINGE FOR IV PUSH (FOR BLOOD PRESSURE SUPPORT)
80.0000 ug | PREFILLED_SYRINGE | INTRAVENOUS | Status: DC | PRN
  Filled 2023-03-20: qty 10

## 2023-03-20 MED ORDER — FENTANYL CITRATE (PF) 100 MCG/2ML IJ SOLN
100.0000 ug | INTRAMUSCULAR | Status: DC | PRN
Start: 2023-03-20 — End: 2023-03-20

## 2023-03-20 MED ORDER — ACETAMINOPHEN 325 MG PO TABS: 650.0000 mg | ORAL_TABLET | ORAL | Status: DC | PRN

## 2023-03-20 MED ORDER — LACTATED RINGERS IV SOLN: INTRAVENOUS | Status: DC

## 2023-03-20 MED ORDER — MIDAZOLAM HCL 2 MG/2ML IJ SOLN
INTRAMUSCULAR | Status: AC
Start: 2023-03-20 — End: ?
  Filled 2023-03-20: qty 2

## 2023-03-20 MED ORDER — OXYTOCIN-SODIUM CHLORIDE 30-0.9 UT/500ML-% IV SOLN
2.5000 [IU]/h | INTRAVENOUS | Status: DC
  Administered 2023-03-20: 2.5 [IU]/h via INTRAVENOUS
  Filled 2023-03-20: qty 500

## 2023-03-20 MED ORDER — FENTANYL-BUPIVACAINE-NACL 0.5-0.125-0.9 MG/250ML-% EP SOLN
12.0000 mL/h | EPIDURAL | Status: DC | PRN
  Administered 2023-03-20: 12 mL/h via EPIDURAL

## 2023-03-20 MED ORDER — FENTANYL CITRATE (PF) 100 MCG/2ML IJ SOLN
INTRAMUSCULAR | Status: DC | PRN
  Administered 2023-03-20: 100 ug via EPIDURAL

## 2023-03-20 MED ORDER — LACTATED RINGERS IV SOLN: 500.0000 mL | INTRAVENOUS | Status: DC | PRN

## 2023-03-20 MED ORDER — EPHEDRINE 5 MG/ML INJ: 10.0000 mg | INTRAVENOUS | Status: DC | PRN

## 2023-03-20 MED ORDER — LACTATED RINGERS IV SOLN
500.0000 mL | Freq: Once | INTRAVENOUS | Status: AC
  Administered 2023-03-20: 500 mL via INTRAVENOUS

## 2023-03-20 MED ORDER — PHENYLEPHRINE 80 MCG/ML (10ML) SYRINGE FOR IV PUSH (FOR BLOOD PRESSURE SUPPORT): 80.0000 ug | PREFILLED_SYRINGE | INTRAVENOUS | Status: DC | PRN

## 2023-03-20 MED ORDER — KETOROLAC TROMETHAMINE 30 MG/ML IJ SOLN
INTRAMUSCULAR | Status: DC | PRN
Start: 2023-03-20 — End: 2023-03-20
  Administered 2023-03-20: 30 mg via INTRAVENOUS

## 2023-03-20 MED ORDER — ONDANSETRON HCL 4 MG PO TABS: 4.0000 mg | ORAL_TABLET | ORAL | Status: DC | PRN

## 2023-03-20 MED ORDER — ACETAMINOPHEN 10 MG/ML IV SOLN
INTRAVENOUS | Status: DC | PRN
  Administered 2023-03-20: 1000 mg via INTRAVENOUS

## 2023-03-20 MED ORDER — DIPHENHYDRAMINE HCL 25 MG PO CAPS: 25.0000 mg | ORAL_CAPSULE | Freq: Four times a day (QID) | ORAL | Status: DC | PRN

## 2023-03-20 MED ORDER — OXYCODONE HCL 5 MG PO TABS: 10.0000 mg | ORAL_TABLET | Freq: Four times a day (QID) | ORAL | Status: DC | PRN

## 2023-03-20 MED ORDER — HYDROMORPHONE HCL 1 MG/ML IJ SOLN: 0.2500 mg | INTRAMUSCULAR | Status: DC | PRN

## 2023-03-20 MED ORDER — FENTANYL CITRATE (PF) 100 MCG/2ML IJ SOLN
INTRAMUSCULAR | Status: AC
  Filled 2023-03-20: qty 2

## 2023-03-20 MED ORDER — LIDOCAINE-EPINEPHRINE (PF) 2 %-1:200000 IJ SOLN
INTRAMUSCULAR | Status: AC
  Filled 2023-03-20: qty 20

## 2023-03-20 MED ORDER — MEPERIDINE HCL 25 MG/ML IJ SOLN: 6.2500 mg | INTRAMUSCULAR | Status: DC | PRN

## 2023-03-20 MED ORDER — OXYCODONE-ACETAMINOPHEN 5-325 MG PO TABS: 1.0000 | ORAL_TABLET | ORAL | Status: DC | PRN

## 2023-03-20 MED ORDER — PRENATAL MULTIVITAMIN CH
1.0000 | ORAL_TABLET | Freq: Every day | ORAL | Status: DC
  Administered 2023-03-21: 1 via ORAL
  Filled 2023-03-20: qty 1

## 2023-03-20 MED ORDER — ONDANSETRON HCL 4 MG/2ML IJ SOLN
INTRAMUSCULAR | Status: AC
  Filled 2023-03-20: qty 2

## 2023-03-20 MED ORDER — AMISULPRIDE (ANTIEMETIC) 5 MG/2ML IV SOLN: 10.0000 mg | Freq: Once | INTRAVENOUS | Status: DC | PRN

## 2023-03-20 MED ORDER — MEDROXYPROGESTERONE ACETATE 150 MG/ML IM SUSP: 150.0000 mg | INTRAMUSCULAR | Status: DC | PRN

## 2023-03-20 MED ORDER — LACTATED RINGERS IV SOLN: INTRAVENOUS | Status: DC | PRN

## 2023-03-20 MED ORDER — DIPHENHYDRAMINE HCL 50 MG/ML IJ SOLN: 12.5000 mg | INTRAMUSCULAR | Status: DC | PRN

## 2023-03-20 MED ORDER — FENTANYL CITRATE (PF) 250 MCG/5ML IJ SOLN
INTRAMUSCULAR | Status: DC | PRN
  Administered 2023-03-20: 100 ug via INTRAVENOUS
  Administered 2023-03-20 (×2): 50 ug via INTRAVENOUS

## 2023-03-20 MED ORDER — SIMETHICONE 80 MG PO CHEW
80.0000 mg | CHEWABLE_TABLET | ORAL | Status: DC | PRN
Start: 2023-03-20 — End: 2023-03-21
  Administered 2023-03-21: 80 mg via ORAL
  Filled 2023-03-20: qty 1

## 2023-03-20 MED ORDER — COCONUT OIL OIL: 1.0000 | TOPICAL_OIL | Status: DC | PRN

## 2023-03-20 MED ORDER — DIBUCAINE (PERIANAL) 1 % EX OINT: 1.0000 | TOPICAL_OINTMENT | CUTANEOUS | Status: DC | PRN

## 2023-03-20 MED ORDER — OXYCODONE HCL 5 MG/5ML PO SOLN: 5.0000 mg | Freq: Once | ORAL | Status: DC | PRN

## 2023-03-20 MED ORDER — FENTANYL CITRATE (PF) 250 MCG/5ML IJ SOLN
INTRAMUSCULAR | Status: AC
  Filled 2023-03-20: qty 5

## 2023-03-20 MED ORDER — ACETAMINOPHEN 10 MG/ML IV SOLN
INTRAVENOUS | Status: AC
  Filled 2023-03-20: qty 100

## 2023-03-20 MED ORDER — OXYCODONE HCL 5 MG PO TABS: 5.0000 mg | ORAL_TABLET | Freq: Once | ORAL | Status: DC | PRN

## 2023-03-20 MED ORDER — SOD CITRATE-CITRIC ACID 500-334 MG/5ML PO SOLN: 30.0000 mL | ORAL | Status: DC | PRN

## 2023-03-20 SURGICAL SUPPLY — 24 items
DERMABOND ADVANCED .7 DNX12 (GAUZE/BANDAGES/DRESSINGS) ×1 IMPLANT
DRSG OPSITE POSTOP 3X4 (GAUZE/BANDAGES/DRESSINGS) ×2 IMPLANT
DURAPREP 26ML APPLICATOR (WOUND CARE) ×2 IMPLANT
GLOVE BIOGEL PI IND STRL 6.5 (GLOVE) ×4 IMPLANT
GLOVE BIOGEL PI IND STRL 7.0 (GLOVE) ×2 IMPLANT
GLOVE SURG SS PI 6.0 STRL IVOR (GLOVE) ×2 IMPLANT
GLOVE SURG SS PI 6.5 STRL IVOR (GLOVE) ×2 IMPLANT
GOWN STRL REUS W/TWL LRG LVL3 (GOWN DISPOSABLE) ×6 IMPLANT
NDL SPNL 22GX3.5 QUINCKE BK (NEEDLE) ×1 IMPLANT
NEEDLE HYPO 22GX1.5 SAFETY (NEEDLE) IMPLANT
NEEDLE SPNL 22GX3.5 QUINCKE BK (NEEDLE) ×2 IMPLANT
NS IRRIG 1000ML POUR BTL (IV SOLUTION) ×4 IMPLANT
PACK ABDOMINAL MINOR (CUSTOM PROCEDURE TRAY) ×2 IMPLANT
PACK VAGINAL MINOR WOMEN LF (CUSTOM PROCEDURE TRAY) ×2 IMPLANT
PROTECTOR NERVE ULNAR (MISCELLANEOUS) ×2 IMPLANT
SPONGE GAUZE 2X2 8PLY STRL LF (GAUZE/BANDAGES/DRESSINGS) ×2 IMPLANT
SPONGE LAP 4X18 RFD (DISPOSABLE) IMPLANT
SUT PLAIN 0 NONE (SUTURE) ×2 IMPLANT
SUT VIC AB 0 CT1 27XBRD ANBCTR (SUTURE) ×2 IMPLANT
SUT VIC AB 3-0 PS2 18 (SUTURE) ×2 IMPLANT
SYR CONTROL 10ML LL (SYRINGE) ×2 IMPLANT
TOWEL OR 17X24 6PK STRL BLUE (TOWEL DISPOSABLE) ×4 IMPLANT
TRAY FOLEY CATH SILVER 14FR (SET/KITS/TRAYS/PACK) ×2 IMPLANT
WATER STERILE IRR 1000ML POUR (IV SOLUTION) ×2 IMPLANT

## 2023-03-20 NOTE — Transfer of Care (Signed)
Immediate Anesthesia Transfer of Care Note  Patient: Connie Cabrera  Procedure(s) Performed: POST PARTUM TUBAL LIGATION (Bilateral)  Patient Location: PACU  Anesthesia Type:Epidural  Level of Consciousness: awake, alert , and oriented  Airway & Oxygen Therapy: Patient Spontanous Breathing  Post-op Assessment: Report given to RN and Post -op Vital signs reviewed and stable  Post vital signs: Reviewed and stable  Last Vitals:  Vitals Value Taken Time  BP 109/58 03/20/23 1222  Temp    Pulse 77 03/20/23 1223  Resp 12 03/20/23 1223  SpO2 100 % 03/20/23 1223  Vitals shown include unfiled device data.  Last Pain:  Vitals:   03/20/23 0817  TempSrc: Axillary  PainSc:          Complications: No notable events documented.

## 2023-03-20 NOTE — Anesthesia Procedure Notes (Signed)
Epidural Patient location during procedure: OB Start time: 03/20/2023 6:46 AM End time: 03/20/2023 6:49 AM  Staffing Anesthesiologist: Kaylyn Layer, MD Performed: anesthesiologist   Preanesthetic Checklist Completed: patient identified, IV checked, risks and benefits discussed, monitors and equipment checked, pre-op evaluation and timeout performed  Epidural Patient position: sitting Prep: DuraPrep and site prepped and draped Patient monitoring: continuous pulse ox, blood pressure and heart rate Approach: midline Location: L3-L4 Injection technique: LOR air  Needle:  Needle type: Tuohy  Needle gauge: 17 G Needle length: 9 cm Needle insertion depth: 6 cm Catheter type: closed end flexible Catheter size: 19 Gauge Catheter at skin depth: 11 cm Test dose: negative and Other (1% lidocaine)  Assessment Events: blood not aspirated, no cerebrospinal fluid, injection not painful, no injection resistance, no paresthesia and negative IV test  Additional Notes Patient identified. Risks, benefits, and alternatives discussed with patient including but not limited to bleeding, infection, nerve damage, paralysis, failed block, incomplete pain control, headache, blood pressure changes, nausea, vomiting, reactions to medication, itching, and postpartum back pain. Confirmed with bedside nurse the patient's most recent platelet count. Confirmed with patient that they are not currently taking any anticoagulation, have any bleeding history, or any family history of bleeding disorders. Patient expressed understanding and wished to proceed. All questions were answered. Sterile technique was used throughout the entire procedure. Please see nursing notes for vital signs.   Crisp LOR on first pass. Test dose was given through epidural catheter and negative prior to continuing to dose epidural or start infusion. Warning signs of high block given to the patient including shortness of breath,  tingling/numbness in hands, complete motor block, or any concerning symptoms with instructions to call for help. Patient was given instructions on fall risk and not to get out of bed. All questions and concerns addressed with instructions to call with any issues or inadequate analgesia.  Reason for block:procedure for pain

## 2023-03-20 NOTE — Anesthesia Preprocedure Evaluation (Addendum)
Anesthesia Evaluation  Patient identified by MRN, date of birth, ID band Patient awake    Reviewed: Allergy & Precautions, NPO status , Patient's Chart, lab work & pertinent test results  History of Anesthesia Complications (+) history of anesthetic complications (requires more anesthesia per pt- dental surgery and arm surgery)  Airway Mallampati: II  TM Distance: >3 FB Neck ROM: Full    Dental no notable dental hx.    Pulmonary asthma , former smoker   Pulmonary exam normal breath sounds clear to auscultation       Cardiovascular negative cardio ROS Normal cardiovascular exam Rhythm:Regular Rate:Normal     Neuro/Psych  PSYCHIATRIC DISORDERS Anxiety Depression Bipolar Disorder   negative neurological ROS     GI/Hepatic negative GI ROS, Neg liver ROS,,,  Endo/Other  Obesity BMI 35  Renal/GU negative Renal ROS  negative genitourinary   Musculoskeletal negative musculoskeletal ROS (+)    Abdominal  (+) + obese  Peds negative pediatric ROS (+)  Hematology negative hematology ROS (+) Hb 12.6, plt 190   Anesthesia Other Findings   Reproductive/Obstetrics Postpartum, desires sterilization                             Anesthesia Physical Anesthesia Plan  ASA: 2  Anesthesia Plan: Epidural   Post-op Pain Management:    Induction:   PONV Risk Score and Plan: 2  Airway Management Planned: Natural Airway  Additional Equipment: None  Intra-op Plan:   Post-operative Plan:   Informed Consent: I have reviewed the patients History and Physical, chart, labs and discussed the procedure including the risks, benefits and alternatives for the proposed anesthesia with the patient or authorized representative who has indicated his/her understanding and acceptance.       Plan Discussed with:   Anesthesia Plan Comments:        Anesthesia Quick Evaluation
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
## 2023-03-20 NOTE — Op Note (Signed)
PREOPERATIVE DIAGNOSIS:  Undesired fertility  POSTOPERATIVE DIAGNOSIS:  Undesired fertility  PROCEDURE:  Postpartum Bilateral Tubal Sterilization via bilateral salpingectomy  ANESTHESIA:  Epidural  COMPLICATIONS:  None immediate.  ESTIMATED BLOOD LOSS:  Less than 20cc.  FLUIDS: 800 cc LR.  URINE OUTPUT:  200 cc of clear urine.  INDICATIONS: 32 y.o. yo [id number]  with undesired fertility,status post vaginal delivery, desires permanent sterilization. Risks and benefits of procedure discussed with patient including permanence of method, bleeding, infection, injury to surrounding organs and need for additional procedures. Risk failure of 0.5-1% with increased risk of ectopic gestation if pregnancy occurs was also discussed with patient.   FINDINGS:  Normal uterus, tubes, and ovaries.  TECHNIQUE: After informed consent was obtained, the patient was taken to the operating room where anesthesia was induced and found to be adequate. A small transverse, infraumbilical skin incision was made with the scalpel. This incision was carried down to the underlying layer of fascia. The fascia was grasped with Kocher clamps tented up and entered sharply with Mayo scissors. Underlying peritoneum was then identified tented up and entered sharply with Metzenbaum scissors. The fascia was tagged with 0 Vicryl. The patient's left fallopian tube was then identified, brought to the incision, and grasped with a Babcock clamp. The tube was then followed out to the fimbria. The Babcock clamp was then used to grasp the tube. The tube was doubly clamped with Kelly clamps and transected. The pedicle was then suture ligated x 2. Good hemostasis was noted and the tube was returned to the abdomen. The right fallopian tube was then identified to its fimbriated end, clamped, transected and ligated in a similar fashion. Excellent hemostasis was noted, and the tube returned to the abdomen. The fascia was re-approximated with 0 Vicryl.  The skin was closed in a subcuticular fashion with 3-0 Vicryl. Quarter percent Marcaine solution was then injected at the incision site. The patient tolerated the procedure well. Sponge, lap, and needle count were correct x2. The patient was taken to recovery room in stable condition.

## 2023-03-20 NOTE — Anesthesia Postprocedure Evaluation (Signed)
Anesthesia Post Note  Patient: Connie Cabrera  Procedure(s) Performed: POST PARTUM TUBAL LIGATION (Bilateral)     Patient location during evaluation: PACU Anesthesia Type: Epidural Level of consciousness: awake and alert and oriented Pain management: pain level controlled Vital Signs Assessment: post-procedure vital signs reviewed and stable Respiratory status: spontaneous breathing, nonlabored ventilation and respiratory function stable Cardiovascular status: blood pressure returned to baseline and stable Postop Assessment: no headache, no backache, epidural receding and no apparent nausea or vomiting Anesthetic complications: no   No notable events documented.  Last Vitals:  Vitals:   03/20/23 1248 03/20/23 1249  BP: (!) 97/55   Pulse: 80 77  Resp: 10 19  Temp:    SpO2: 98% 100%    Last Pain:  Vitals:   03/20/23 1222  TempSrc: Oral  PainSc:    Pain Goal:            L Sensory Level: (P) L5-Outer lower leg, top of foot, great toe (03/20/23 1300) R Sensory Level: (P) L5-Outer lower leg, top of foot, great toe (03/20/23 1300) Epidural/Spinal Function Cutaneous sensation: Able to Wiggle Toes (03/20/23 1245), Patient able to flex knees: No (03/20/23 1245), Patient able to lift hips off bed: No (03/20/23 1245), Back pain beyond tenderness at insertion site: No (03/20/23 1245), Progressively worsening motor and/or sensory loss: No (03/20/23 1245), Bowel and/or bladder incontinence post epidural: No (03/20/23 1245)  [name]

## 2023-03-20 NOTE — Progress Notes (Signed)
LABOR PROGRESS NOTE  Patient Name: Connie Cabrera, female   DOB: [birth year], 32 y.o.  MRN: [id number]  [id number] at [redacted]w[redacted]d admitted for SOL  S: Comfortable s/p epidural. Amenable to AROM  O:  BP (!) 99/56   Pulse 71   Temp 97.7 F (36.5 C) (Axillary)   Resp 18   LMP 06/21/2022   SpO2 99%  EFM:125 bpm/Moderate variability/ 15x15 accels/ None decels CAT: 1 Toco: regular, every 2-3 minutes   CVE: Dilation: Lip/rim Effacement (%): 100 Station: 0 Presentation: Vertex Exam by:: Dr. Earlene Plater   A&P:   #Labor: Progressing well. BBOW. Risks/benefits of AROM discussed with patient, and verbal consent obtained. Obtained clear fluid. Mom and babe tolerated well. #Pain: Epidural #FWB: CAT 1 #GBS negative #Anticipate vaginal delivery  Joanne Gavel, MD FMOB Fellow, Faculty practice [facility], [facility] 03/20/23  8:36 AM

## 2023-03-20 NOTE — Progress Notes (Signed)
Arrived to unit. Notified by nurse, patient has been transferred off unit. Tomasita Morrow, RN VAST

## 2023-03-20 NOTE — MAU Note (Signed)
.  Connie Cabrera is a 32 y.o. at [redacted]w[redacted]d here in MAU reporting: pulled straight back to room  Contractions back to back starting to 0355   Denies LOF, VB    DFM since 0230    Onset of complaint: 0355 contractions  Pain score: 10/10 Vitals:   03/20/23 0515  BP: 117/76  Pulse: (!) 107  Resp: 18  Temp: 98.1 F (36.7 C)  SpO2: 99%     FHT:127 Lab orders placed from triage: n/a

## 2023-03-20 NOTE — Anesthesia Preprocedure Evaluation (Signed)
Anesthesia Evaluation  Patient identified by MRN, date of birth, ID band Patient awake    Reviewed: Allergy & Precautions, Patient's Chart, lab work & pertinent test results  History of Anesthesia Complications Negative for: history of anesthetic complications  Airway Mallampati: II  TM Distance: >3 FB Neck ROM: Full    Dental no notable dental hx.    Pulmonary asthma , former smoker   Pulmonary exam normal        Cardiovascular negative cardio ROS Normal cardiovascular exam     Neuro/Psych   Anxiety Depression Bipolar Disorder      GI/Hepatic negative GI ROS, Neg liver ROS,,,  Endo/Other  negative endocrine ROS    Renal/GU negative Renal ROS     Musculoskeletal negative musculoskeletal ROS (+)    Abdominal   Peds  Hematology negative hematology ROS (+)   Anesthesia Other Findings Day of surgery medications reviewed with patient.  Reproductive/Obstetrics (+) Pregnancy                              Anesthesia Physical Anesthesia Plan  ASA: 2  Anesthesia Plan: Epidural   Post-op Pain Management:    Induction:   PONV Risk Score and Plan: Treatment may vary due to age or medical condition  Airway Management Planned: Natural Airway  Additional Equipment: Fetal Monitoring  Intra-op Plan:   Post-operative Plan:   Informed Consent: I have reviewed the patients History and Physical, chart, labs and discussed the procedure including the risks, benefits and alternatives for the proposed anesthesia with the patient or authorized representative who has indicated his/her understanding and acceptance.       Plan Discussed with:   Anesthesia Plan Comments:          Anesthesia Quick Evaluation
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
## 2023-03-21 ENCOUNTER — Encounter: Payer: Self-pay | Admitting: Certified Nurse Midwife

## 2023-03-21 MED ORDER — ACETAMINOPHEN 325 MG PO TABS
650.0000 mg | ORAL_TABLET | Freq: Four times a day (QID) | ORAL | Status: AC | PRN
Start: 2023-03-21 — End: ?

## 2023-03-21 MED ORDER — OXYCODONE HCL 5 MG PO TABS: 5.0000 mg | ORAL_TABLET | Freq: Four times a day (QID) | ORAL | 0 refills | Status: AC | PRN

## 2023-03-21 MED ORDER — IBUPROFEN 800 MG PO TABS: 800.0000 mg | ORAL_TABLET | Freq: Three times a day (TID) | ORAL | 0 refills | Status: AC

## 2023-03-21 MED ORDER — DOCUSATE SODIUM 100 MG PO CAPS: 100.0000 mg | ORAL_CAPSULE | Freq: Two times a day (BID) | ORAL | 0 refills | Status: AC

## 2023-03-21 NOTE — Plan of Care (Signed)
Problem: Education: Goal: Knowledge of General Education information will improve Description: Including pain rating scale, medication(s)/side effects and non-pharmacologic comfort measures 03/21/2023 0757 by Donne Hazel, LPN Outcome: Adequate for Discharge 03/21/2023 0754 by Donne Hazel, LPN Outcome: Progressing   Problem: Health Behavior/Discharge Planning: Goal: Ability to manage health-related needs will improve 03/21/2023 0757 by Donne Hazel, LPN Outcome: Adequate for Discharge 03/21/2023 0754 by Donne Hazel, LPN Outcome: Progressing   Problem: Clinical Measurements: Goal: Ability to maintain clinical measurements within normal limits will improve 03/21/2023 0757 by Donne Hazel, LPN Outcome: Adequate for Discharge 03/21/2023 0754 by Donne Hazel, LPN Outcome: Progressing Goal: Will remain free from infection 03/21/2023 0757 by Donne Hazel, LPN Outcome: Adequate for Discharge 03/21/2023 0754 by Donne Hazel, LPN Outcome: Progressing Goal: Diagnostic test results will improve 03/21/2023 0757 by Donne Hazel, LPN Outcome: Adequate for Discharge 03/21/2023 0754 by Donne Hazel, LPN Outcome: Progressing Goal: Respiratory complications will improve 03/21/2023 0757 by Donne Hazel, LPN Outcome: Adequate for Discharge 03/21/2023 0754 by Donne Hazel, LPN Outcome: Progressing Goal: Cardiovascular complication will be avoided 03/21/2023 0757 by Donne Hazel, LPN Outcome: Adequate for Discharge 03/21/2023 0754 by Donne Hazel, LPN Outcome: Progressing   Problem: Activity: Goal: Risk for activity intolerance will decrease 03/21/2023 0757 by Donne Hazel, LPN Outcome: Adequate for Discharge 03/21/2023 0754 by Donne Hazel, LPN Outcome: Progressing   Problem: Nutrition: Goal: Adequate nutrition will be maintained 03/21/2023 0757 by Donne Hazel, LPN Outcome: Adequate for Discharge 03/21/2023 0754 by Donne Hazel, LPN Outcome:  Progressing   Problem: Coping: Goal: Level of anxiety will decrease 03/21/2023 0757 by Donne Hazel, LPN Outcome: Adequate for Discharge 03/21/2023 0754 by Donne Hazel, LPN Outcome: Progressing   Problem: Elimination: Goal: Will not experience complications related to bowel motility 03/21/2023 0757 by Donne Hazel, LPN Outcome: Adequate for Discharge 03/21/2023 0754 by Donne Hazel, LPN Outcome: Progressing Goal: Will not experience complications related to urinary retention 03/21/2023 0757 by Donne Hazel, LPN Outcome: Adequate for Discharge 03/21/2023 0754 by Donne Hazel, LPN Outcome: Progressing   Problem: Pain Management: Goal: General experience of comfort will improve 03/21/2023 0757 by Donne Hazel, LPN Outcome: Adequate for Discharge 03/21/2023 0754 by Donne Hazel, LPN Outcome: Progressing   Problem: Safety: Goal: Ability to remain free from injury will improve 03/21/2023 0757 by Donne Hazel, LPN Outcome: Adequate for Discharge 03/21/2023 0754 by Donne Hazel, LPN Outcome: Progressing   Problem: Skin Integrity: Goal: Risk for impaired skin integrity will decrease 03/21/2023 0757 by Donne Hazel, LPN Outcome: Adequate for Discharge 03/21/2023 0754 by Donne Hazel, LPN Outcome: Progressing   Problem: Education: Goal: Knowledge of Childbirth will improve 03/21/2023 0757 by Donne Hazel, LPN Outcome: Adequate for Discharge 03/21/2023 0754 by Donne Hazel, LPN Outcome: Progressing Goal: Ability to make informed decisions regarding treatment and plan of care will improve 03/21/2023 0757 by Donne Hazel, LPN Outcome: Adequate for Discharge 03/21/2023 0754 by Donne Hazel, LPN Outcome: Progressing Goal: Ability to state and carry out methods to decrease the pain will improve 03/21/2023 0757 by Donne Hazel, LPN Outcome: Adequate for Discharge 03/21/2023 0754 by Donne Hazel, LPN Outcome: Progressing Goal: Individualized  Educational Video(s) 03/21/2023 0757 by Donne Hazel, LPN Outcome: Adequate for Discharge 03/21/2023 0754 by Donne Hazel, LPN Outcome: Progressing   Problem: Coping: Goal: Ability to verbalize concerns and feelings  about labor and delivery will improve 03/21/2023 0757 by Donne Hazel, LPN Outcome: Adequate for Discharge 03/21/2023 0754 by Donne Hazel, LPN Outcome: Progressing   Problem: Life Cycle: Goal: Ability to make normal progression through stages of labor will improve 03/21/2023 0757 by Donne Hazel, LPN Outcome: Adequate for Discharge 03/21/2023 0754 by Donne Hazel, LPN Outcome: Progressing Goal: Ability to effectively push during vaginal delivery will improve 03/21/2023 0757 by Donne Hazel, LPN Outcome: Adequate for Discharge 03/21/2023 0754 by Donne Hazel, LPN Outcome: Progressing   Problem: Role Relationship: Goal: Will demonstrate positive interactions with the child 03/21/2023 0757 by Donne Hazel, LPN Outcome: Adequate for Discharge 03/21/2023 0754 by Donne Hazel, LPN Outcome: Progressing   Problem: Safety: Goal: Risk of complications during labor and delivery will decrease 03/21/2023 0757 by Donne Hazel, LPN Outcome: Adequate for Discharge 03/21/2023 0754 by Donne Hazel, LPN Outcome: Progressing   Problem: Pain Management: Goal: Relief or control of pain from uterine contractions will improve 03/21/2023 0757 by Donne Hazel, LPN Outcome: Adequate for Discharge 03/21/2023 0754 by Donne Hazel, LPN Outcome: Progressing   Problem: Education: Goal: Knowledge of condition will improve 03/21/2023 0757 by Donne Hazel, LPN Outcome: Adequate for Discharge 03/21/2023 0754 by Donne Hazel, LPN Outcome: Progressing Goal: Individualized Educational Video(s) 03/21/2023 0757 by Donne Hazel, LPN Outcome: Adequate for Discharge 03/21/2023 0754 by Donne Hazel, LPN Outcome: Progressing Goal: Individualized Newborn  Educational Video(s) 03/21/2023 0757 by Donne Hazel, LPN Outcome: Adequate for Discharge 03/21/2023 0754 by Donne Hazel, LPN Outcome: Progressing   Problem: Activity: Goal: Will verbalize the importance of balancing activity with adequate rest periods 03/21/2023 0757 by Donne Hazel, LPN Outcome: Adequate for Discharge 03/21/2023 0754 by Donne Hazel, LPN Outcome: Progressing Goal: Ability to tolerate increased activity will improve 03/21/2023 0757 by Donne Hazel, LPN Outcome: Adequate for Discharge 03/21/2023 0754 by Donne Hazel, LPN Outcome: Progressing   Problem: Coping: Goal: Ability to identify and utilize available resources and services will improve 03/21/2023 0757 by Donne Hazel, LPN Outcome: Adequate for Discharge 03/21/2023 0754 by Donne Hazel, LPN Outcome: Progressing   Problem: Life Cycle: Goal: Chance of risk for complications during the postpartum period will decrease 03/21/2023 0757 by Donne Hazel, LPN Outcome: Adequate for Discharge 03/21/2023 0754 by Donne Hazel, LPN Outcome: Progressing   Problem: Role Relationship: Goal: Ability to demonstrate positive interaction with newborn will improve 03/21/2023 0757 by Donne Hazel, LPN Outcome: Adequate for Discharge 03/21/2023 0754 by Donne Hazel, LPN Outcome: Progressing   Problem: Skin Integrity: Goal: Demonstration of wound healing without infection will improve 03/21/2023 0757 by Donne Hazel, LPN Outcome: Adequate for Discharge 03/21/2023 0754 by Donne Hazel, LPN Outcome: Progressing

## 2023-03-21 NOTE — Plan of Care (Signed)
  Problem: Education: Goal: Knowledge of General Education information will improve Description: Including pain rating scale, medication(s)/side effects and non-pharmacologic comfort measures Outcome: Progressing   Problem: Health Behavior/Discharge Planning: Goal: Ability to manage health-related needs will improve Outcome: Progressing   Problem: Clinical Measurements: Goal: Ability to maintain clinical measurements within normal limits will improve Outcome: Progressing Goal: Will remain free from infection Outcome: Progressing Goal: Diagnostic test results will improve Outcome: Progressing Goal: Respiratory complications will improve Outcome: Progressing Goal: Cardiovascular complication will be avoided Outcome: Progressing   Problem: Activity: Goal: Risk for activity intolerance will decrease Outcome: Progressing   Problem: Nutrition: Goal: Adequate nutrition will be maintained Outcome: Progressing   Problem: Coping: Goal: Level of anxiety will decrease Outcome: Progressing   Problem: Elimination: Goal: Will not experience complications related to bowel motility Outcome: Progressing Goal: Will not experience complications related to urinary retention Outcome: Progressing   Problem: Pain Management: Goal: General experience of comfort will improve Outcome: Progressing   Problem: Safety: Goal: Ability to remain free from injury will improve Outcome: Progressing   Problem: Skin Integrity: Goal: Risk for impaired skin integrity will decrease Outcome: Progressing   Problem: Education: Goal: Knowledge of Childbirth will improve Outcome: Progressing Goal: Ability to make informed decisions regarding treatment and plan of care will improve Outcome: Progressing Goal: Ability to state and carry out methods to decrease the pain will improve Outcome: Progressing Goal: Individualized Educational Video(s) Outcome: Progressing   Problem: Coping: Goal: Ability to  verbalize concerns and feelings about labor and delivery will improve Outcome: Progressing   Problem: Life Cycle: Goal: Ability to make normal progression through stages of labor will improve Outcome: Progressing Goal: Ability to effectively push during vaginal delivery will improve Outcome: Progressing   Problem: Role Relationship: Goal: Will demonstrate positive interactions with the child Outcome: Progressing   Problem: Safety: Goal: Risk of complications during labor and delivery will decrease Outcome: Progressing   Problem: Pain Management: Goal: Relief or control of pain from uterine contractions will improve Outcome: Progressing   Problem: Education: Goal: Knowledge of condition will improve Outcome: Progressing Goal: Individualized Educational Video(s) Outcome: Progressing Goal: Individualized Newborn Educational Video(s) Outcome: Progressing   Problem: Activity: Goal: Will verbalize the importance of balancing activity with adequate rest periods Outcome: Progressing Goal: Ability to tolerate increased activity will improve Outcome: Progressing   Problem: Coping: Goal: Ability to identify and utilize available resources and services will improve Outcome: Progressing   Problem: Life Cycle: Goal: Chance of risk for complications during the postpartum period will decrease Outcome: Progressing   Problem: Role Relationship: Goal: Ability to demonstrate positive interaction with newborn will improve Outcome: Progressing   Problem: Skin Integrity: Goal: Demonstration of wound healing without infection will improve Outcome: Progressing
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
## 2023-03-21 NOTE — Anesthesia Postprocedure Evaluation (Signed)
Anesthesia Post Note  Patient: Chardonnay Casanova  Procedure(s) Performed: AN AD HOC LABOR EPIDURAL     Patient location during evaluation: Mother Baby Anesthesia Type: Epidural Level of consciousness: awake, oriented and awake and alert Pain management: pain level controlled Vital Signs Assessment: post-procedure vital signs reviewed and stable Respiratory status: spontaneous breathing, respiratory function stable and nonlabored ventilation Cardiovascular status: stable Postop Assessment: no headache, adequate PO intake, able to ambulate, patient able to bend at knees and no apparent nausea or vomiting Anesthetic complications: no   No notable events documented.  Last Vitals:  Vitals:   03/21/23 0019 03/21/23 0431  BP: 117/81 107/69  Pulse: 73 63  Resp: 18 18  Temp: 36.5 C 36.5 C  SpO2: 98% 96%    Last Pain:  Vitals:   03/21/23 0840  TempSrc:   PainSc: 0-No pain   Pain Goal:                   Oaklie Durrett

## 2023-03-21 NOTE — Discharge Instructions (Signed)
WHAT TO LOOK OUT FOR: Fever of 100.4 or above Mastitis: feels like flu and breasts hurt Infection: increased pain, swelling or redness Blood clots golf ball size or larger Postpartum depression   Congratulations on your newest addition!

## 2023-03-21 NOTE — Lactation Note (Signed)
This note was copied from a baby's chart. Lactation Consultation Note  Patient Name: Connie Cabrera [id number] Date: 03/21/2023 Age:34 hours Reason for consult: Initial assessment;Early term 37-38.6wks Per MOB, infant recently BF for 10 minutes at 2300 pm. Her goal is to BF only in hospital and will switch to formula only when going home this is her 4 th child and that was her plan with her previous children. MOB does not have any questions or concerns for LC at this time. She will continue to BF infant by cues, on demand, every 2-3 hours. Her feeding choice is BF and formula feeding. LC gave handout "Feeding Guidelines". LC closed MOB out for [facility] services. MOB knows she can call for latch assistance or if she has any questions or concerns for LC. LC discussed importance of maternal rest, balance meals and rest. LC discussed we are here to support her feeding choices and decisions.   Maternal Data Does the patient have breastfeeding experience prior to this delivery?: Yes How long did the patient breastfeed?: Per MOB, she had latch problems with other children and her goal only BF infant in hospital like she did with her previous children  Feeding Mother's Current Feeding Choice: Breast Milk and Formula  LATCH Score                    Lactation Tools Discussed/Used    Interventions Interventions: Breast feeding basics reviewed;Support pillows;Position options;Education  Discharge Pump: Manual (MOB has [employer] insurance not eligible for STORK DEBP was given hand pump by RN.)  Consult Status Consult Status: Complete    Frederico Hamman 03/21/2023, 12:33 AM

## 2023-03-22 ENCOUNTER — Encounter: Payer: Medicaid Other | Admitting: Certified Nurse Midwife

## 2023-03-23 LAB — SURGICAL PATHOLOGY

## 2023-03-29 ENCOUNTER — Encounter: Payer: Self-pay | Admitting: Certified Nurse Midwife

## 2023-03-29 ENCOUNTER — Telehealth: Payer: Self-pay | Admitting: Certified Nurse Midwife

## 2023-03-29 ENCOUNTER — Telehealth: Payer: Self-pay

## 2023-03-29 ENCOUNTER — Telehealth (HOSPITAL_COMMUNITY): Payer: Self-pay

## 2023-03-29 NOTE — Telephone Encounter (Signed)
 Patient delivered on 03-20-2023 she is requesting to be cleared to return to work she has stated that she spoke with [place] about this, patient said she really need to return to work.

## 2023-03-29 NOTE — Telephone Encounter (Signed)
 03/29/2023 1916  Name: Patrese Neal MRN: [id number] DOB: Mar 07, 1991  Reason for Call:  Transition of Care Hospital Discharge Call  Contact Status: Patient Contact Status: Message  Language assistant needed:          Follow-Up Questions:    [place] Postnatal Depression Scale:  In the Past 7 Days:    PHQ2-9 Depression Scale:     Discharge Follow-up:    Post-discharge interventions: NA  Signature  Rosaline Deretha PEAK

## 2023-03-29 NOTE — Telephone Encounter (Signed)
 Called pt in regards to her MyChart.  Pt reports that she needs to work 25 hours per week starting on January 11th to February 8th.  I explained to the patient that she will be able to get this letter via MyChart.  The paperwork that she submitted will need to be picked up.  I informed pt that I will send the requested information to our front office so that the paperwork will be filled out the same.  Pt agreed and stated thank you  Mahogani Holohan,RN  03/29/23

## 2023-03-30 ENCOUNTER — Inpatient Hospital Stay (HOSPITAL_COMMUNITY): Admit: 2023-03-30 | Payer: Medicaid Other

## 2023-04-04 ENCOUNTER — Encounter: Payer: Medicaid Other | Admitting: Certified Nurse Midwife

## 2023-04-06 ENCOUNTER — Inpatient Hospital Stay (HOSPITAL_COMMUNITY): Admission: RE | Admit: 2023-04-06 | Payer: Medicaid Other | Source: Home / Self Care | Admitting: Family Medicine

## 2023-04-06 ENCOUNTER — Inpatient Hospital Stay (HOSPITAL_COMMUNITY): Payer: Medicaid Other

## 2023-04-11 ENCOUNTER — Encounter: Payer: Self-pay | Admitting: Certified Nurse Midwife

## 2023-04-11 ENCOUNTER — Other Ambulatory Visit: Payer: Medicaid Other

## 2023-04-13 ENCOUNTER — Encounter: Payer: Self-pay | Admitting: Family Medicine

## 2023-04-26 ENCOUNTER — Encounter (HOSPITAL_COMMUNITY): Payer: Medicaid Other

## 2023-05-02 ENCOUNTER — Ambulatory Visit: Payer: Medicaid Other | Admitting: Certified Nurse Midwife

## 2023-05-02 ENCOUNTER — Other Ambulatory Visit: Payer: Self-pay

## 2023-05-02 DIAGNOSIS — N870 Mild cervical dysplasia: Secondary | ICD-10-CM
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
